# Patient Record
Sex: Female | Born: 1980 | Race: White | Hispanic: Yes | Marital: Single | Smoking: Never smoker
Health system: Southern US, Community
[De-identification: ages and names within clinical notes are randomized; demographics above are authoritative.]

## PROBLEM LIST (undated history)

## (undated) ENCOUNTER — Inpatient Hospital Stay (HOSPITAL_COMMUNITY): Payer: Self-pay

## (undated) ENCOUNTER — Inpatient Hospital Stay (HOSPITAL_COMMUNITY): Admission: RE | Payer: Self-pay | Source: Ambulatory Visit

## (undated) DIAGNOSIS — Z5189 Encounter for other specified aftercare: Secondary | ICD-10-CM

## (undated) DIAGNOSIS — R000 Tachycardia, unspecified: Secondary | ICD-10-CM

## (undated) DIAGNOSIS — Z789 Other specified health status: Secondary | ICD-10-CM

## (undated) HISTORY — DX: Tachycardia, unspecified: R00.0

## (undated) HISTORY — DX: Encounter for other specified aftercare: Z51.89

---

## 2004-02-15 ENCOUNTER — Inpatient Hospital Stay (HOSPITAL_COMMUNITY): Admission: AD | Admit: 2004-02-15 | Discharge: 2004-02-15 | Payer: Self-pay | Admitting: Obstetrics & Gynecology

## 2004-07-31 ENCOUNTER — Inpatient Hospital Stay (HOSPITAL_COMMUNITY): Admission: AD | Admit: 2004-07-31 | Discharge: 2004-08-01 | Payer: Self-pay | Admitting: *Deleted

## 2004-08-01 ENCOUNTER — Ambulatory Visit: Payer: Self-pay | Admitting: Obstetrics and Gynecology

## 2004-08-01 ENCOUNTER — Inpatient Hospital Stay (HOSPITAL_COMMUNITY): Admission: AD | Admit: 2004-08-01 | Discharge: 2004-08-01 | Payer: Self-pay | Admitting: Obstetrics & Gynecology

## 2004-08-16 ENCOUNTER — Ambulatory Visit: Payer: Self-pay | Admitting: *Deleted

## 2004-08-23 ENCOUNTER — Ambulatory Visit: Payer: Self-pay | Admitting: *Deleted

## 2004-08-30 ENCOUNTER — Ambulatory Visit: Payer: Self-pay | Admitting: *Deleted

## 2004-09-02 ENCOUNTER — Ambulatory Visit: Payer: Self-pay | Admitting: *Deleted

## 2004-09-05 ENCOUNTER — Ambulatory Visit: Payer: Self-pay | Admitting: Obstetrics & Gynecology

## 2004-09-05 ENCOUNTER — Inpatient Hospital Stay (HOSPITAL_COMMUNITY): Admission: AD | Admit: 2004-09-05 | Discharge: 2004-09-08 | Payer: Self-pay | Admitting: Family Medicine

## 2004-09-13 ENCOUNTER — Ambulatory Visit: Payer: Self-pay | Admitting: *Deleted

## 2005-06-06 ENCOUNTER — Emergency Department (HOSPITAL_COMMUNITY): Admission: EM | Admit: 2005-06-06 | Discharge: 2005-06-07 | Payer: Self-pay | Admitting: Emergency Medicine

## 2005-07-04 ENCOUNTER — Emergency Department (HOSPITAL_COMMUNITY): Admission: EM | Admit: 2005-07-04 | Discharge: 2005-07-04 | Payer: Self-pay | Admitting: Emergency Medicine

## 2006-01-02 ENCOUNTER — Emergency Department (HOSPITAL_COMMUNITY): Admission: EM | Admit: 2006-01-02 | Discharge: 2006-01-02 | Payer: Self-pay | Admitting: Emergency Medicine

## 2006-01-03 ENCOUNTER — Inpatient Hospital Stay (HOSPITAL_COMMUNITY): Admission: AD | Admit: 2006-01-03 | Discharge: 2006-01-03 | Payer: Self-pay | Admitting: Gynecology

## 2006-01-17 ENCOUNTER — Ambulatory Visit (HOSPITAL_COMMUNITY): Admission: RE | Admit: 2006-01-17 | Discharge: 2006-01-17 | Payer: Self-pay | Admitting: Gynecology

## 2006-01-24 ENCOUNTER — Encounter (INDEPENDENT_AMBULATORY_CARE_PROVIDER_SITE_OTHER): Payer: Self-pay | Admitting: *Deleted

## 2006-01-24 ENCOUNTER — Ambulatory Visit: Payer: Self-pay | Admitting: *Deleted

## 2006-02-21 ENCOUNTER — Ambulatory Visit: Payer: Self-pay | Admitting: *Deleted

## 2006-03-21 ENCOUNTER — Ambulatory Visit: Payer: Self-pay | Admitting: Obstetrics & Gynecology

## 2006-04-04 ENCOUNTER — Ambulatory Visit: Payer: Self-pay | Admitting: *Deleted

## 2006-04-18 ENCOUNTER — Ambulatory Visit: Payer: Self-pay | Admitting: *Deleted

## 2006-05-02 ENCOUNTER — Ambulatory Visit: Payer: Self-pay | Admitting: Obstetrics & Gynecology

## 2006-05-16 ENCOUNTER — Ambulatory Visit: Payer: Self-pay | Admitting: *Deleted

## 2006-05-23 ENCOUNTER — Ambulatory Visit: Payer: Self-pay | Admitting: Obstetrics & Gynecology

## 2006-05-30 ENCOUNTER — Ambulatory Visit: Payer: Self-pay | Admitting: Obstetrics & Gynecology

## 2006-06-06 ENCOUNTER — Ambulatory Visit: Payer: Self-pay | Admitting: Obstetrics & Gynecology

## 2006-06-12 ENCOUNTER — Ambulatory Visit: Payer: Self-pay | Admitting: Certified Nurse Midwife

## 2006-06-12 ENCOUNTER — Inpatient Hospital Stay (HOSPITAL_COMMUNITY): Admission: AD | Admit: 2006-06-12 | Discharge: 2006-06-14 | Payer: Self-pay | Admitting: Obstetrics & Gynecology

## 2007-01-17 ENCOUNTER — Emergency Department (HOSPITAL_COMMUNITY): Admission: EM | Admit: 2007-01-17 | Discharge: 2007-01-17 | Payer: Self-pay | Admitting: Emergency Medicine

## 2008-06-23 ENCOUNTER — Emergency Department (HOSPITAL_COMMUNITY): Admission: EM | Admit: 2008-06-23 | Discharge: 2008-06-23 | Payer: Self-pay | Admitting: Emergency Medicine

## 2008-11-18 ENCOUNTER — Emergency Department (HOSPITAL_COMMUNITY): Admission: EM | Admit: 2008-11-18 | Discharge: 2008-11-18 | Payer: Self-pay | Admitting: Emergency Medicine

## 2008-12-05 ENCOUNTER — Emergency Department (HOSPITAL_COMMUNITY): Admission: EM | Admit: 2008-12-05 | Discharge: 2008-12-06 | Payer: Self-pay | Admitting: Emergency Medicine

## 2009-05-07 ENCOUNTER — Emergency Department (HOSPITAL_COMMUNITY): Admission: EM | Admit: 2009-05-07 | Discharge: 2009-05-07 | Payer: Self-pay | Admitting: Emergency Medicine

## 2009-08-31 ENCOUNTER — Ambulatory Visit (HOSPITAL_COMMUNITY)
Admission: RE | Admit: 2009-08-31 | Discharge: 2009-08-31 | Payer: Self-pay | Source: Home / Self Care | Admitting: Family Medicine

## 2009-11-10 ENCOUNTER — Ambulatory Visit: Payer: Self-pay | Admitting: Obstetrics and Gynecology

## 2009-11-10 ENCOUNTER — Inpatient Hospital Stay (HOSPITAL_COMMUNITY)
Admission: AD | Admit: 2009-11-10 | Discharge: 2009-11-10 | Payer: Self-pay | Source: Home / Self Care | Admitting: Obstetrics and Gynecology

## 2009-12-31 ENCOUNTER — Ambulatory Visit (HOSPITAL_COMMUNITY)
Admission: RE | Admit: 2009-12-31 | Discharge: 2009-12-31 | Payer: Self-pay | Source: Home / Self Care | Attending: Obstetrics & Gynecology | Admitting: Obstetrics & Gynecology

## 2010-01-03 ENCOUNTER — Inpatient Hospital Stay (HOSPITAL_COMMUNITY)
Admission: AD | Admit: 2010-01-03 | Discharge: 2010-01-05 | Payer: Self-pay | Source: Home / Self Care | Attending: Obstetrics & Gynecology | Admitting: Obstetrics & Gynecology

## 2010-04-04 LAB — CBC
HCT: 40.5 % (ref 36.0–46.0)
Hemoglobin: 13.6 g/dL (ref 12.0–15.0)
MCH: 29.7 pg (ref 26.0–34.0)
MCHC: 33.6 g/dL (ref 30.0–36.0)
RBC: 4.59 MIL/uL (ref 3.87–5.11)

## 2010-04-06 LAB — URINALYSIS, ROUTINE W REFLEX MICROSCOPIC
Bilirubin Urine: NEGATIVE
Glucose, UA: NEGATIVE mg/dL
Ketones, ur: NEGATIVE mg/dL
Specific Gravity, Urine: <1.005 — ABNORMAL LOW (ref 1.005–1.030)
pH: 6.5 (ref 5.0–8.0)

## 2010-04-12 LAB — CBC
HCT: 39.7 % (ref 36.0–46.0)
MCV: 87.9 fL (ref 78.0–100.0)
Platelets: 157 10*3/uL (ref 150–400)
RDW: 13.4 % (ref 11.5–15.5)
WBC: 7.9 10*3/uL (ref 4.0–10.5)

## 2010-04-12 LAB — WET PREP, GENITAL
Trich, Wet Prep: NONE SEEN
Yeast Wet Prep HPF POC: NONE SEEN

## 2010-04-12 LAB — ABO/RH: ABO/RH(D): O POS

## 2010-04-12 LAB — DIFFERENTIAL
Basophils Absolute: 0.1 10*3/uL (ref 0.0–0.1)
Basophils Relative: 1 % (ref 0–1)
Eosinophils Absolute: 0.1 10*3/uL (ref 0.0–0.7)
Eosinophils Relative: 1 % (ref 0–5)
Lymphs Abs: 1.1 10*3/uL (ref 0.7–4.0)
Neutrophils Relative %: 79 % — ABNORMAL HIGH (ref 43–77)

## 2010-04-12 LAB — HCG, QUANTITATIVE, PREGNANCY: hCG, Beta Chain, Quant, S: 58228 m[IU]/mL — ABNORMAL HIGH (ref <5)

## 2010-04-12 LAB — GC/CHLAMYDIA PROBE AMP, GENITAL: Chlamydia, DNA Probe: NEGATIVE

## 2010-04-27 LAB — PREGNANCY, URINE: Preg Test, Ur: NEGATIVE

## 2010-04-27 LAB — URINE CULTURE: Colony Count: NO GROWTH

## 2010-04-27 LAB — URINALYSIS, ROUTINE W REFLEX MICROSCOPIC
Bilirubin Urine: NEGATIVE
Glucose, UA: NEGATIVE mg/dL
Hgb urine dipstick: NEGATIVE
Ketones, ur: NEGATIVE mg/dL
Protein, ur: NEGATIVE mg/dL
Urobilinogen, UA: 0.2 mg/dL (ref 0.0–1.0)

## 2010-04-27 LAB — POCT I-STAT, CHEM 8
Chloride: 102 mEq/L (ref 96–112)
Creatinine, Ser: 0.6 mg/dL (ref 0.4–1.2)
Glucose, Bld: 82 mg/dL (ref 70–99)
HCT: 42 % (ref 36.0–46.0)
Hemoglobin: 14.3 g/dL (ref 12.0–15.0)
Potassium: 3.5 mEq/L (ref 3.5–5.1)
Sodium: 141 mEq/L (ref 135–145)

## 2010-04-27 LAB — URINE MICROSCOPIC-ADD ON

## 2010-04-28 LAB — POCT CARDIAC MARKERS
CKMB, poc: <1 ng/mL — ABNORMAL LOW (ref 1.0–8.0)
Troponin i, poc: <0.05 ng/mL (ref 0.00–0.09)

## 2010-04-28 LAB — URINE MICROSCOPIC-ADD ON

## 2010-04-28 LAB — URINALYSIS, ROUTINE W REFLEX MICROSCOPIC
Glucose, UA: NEGATIVE mg/dL
Protein, ur: NEGATIVE mg/dL
Specific Gravity, Urine: 1.028 (ref 1.005–1.030)
Urobilinogen, UA: 1 mg/dL (ref 0.0–1.0)

## 2010-04-28 LAB — DIFFERENTIAL
Eosinophils Relative: 6 % — ABNORMAL HIGH (ref 0–5)
Lymphocytes Relative: 22 % (ref 12–46)
Lymphs Abs: 1.9 10*3/uL (ref 0.7–4.0)
Monocytes Absolute: 0.5 10*3/uL (ref 0.1–1.0)
Monocytes Relative: 6 % (ref 3–12)
Neutro Abs: 5.5 10*3/uL (ref 1.7–7.7)

## 2010-04-28 LAB — POCT I-STAT, CHEM 8
BUN: 17 mg/dL (ref 6–23)
Chloride: 105 mEq/L (ref 96–112)
Creatinine, Ser: 0.9 mg/dL (ref 0.4–1.2)
Glucose, Bld: 82 mg/dL (ref 70–99)
HCT: 42 % (ref 36.0–46.0)
Potassium: 3.8 mEq/L (ref 3.5–5.1)

## 2010-04-28 LAB — CBC
HCT: 39.3 % (ref 36.0–46.0)
Hemoglobin: 13.8 g/dL (ref 12.0–15.0)
RBC: 4.46 MIL/uL (ref 3.87–5.11)

## 2010-04-28 LAB — URINE CULTURE

## 2010-05-22 ENCOUNTER — Emergency Department (HOSPITAL_COMMUNITY)
Admission: EM | Admit: 2010-05-22 | Discharge: 2010-05-22 | Disposition: A | Discharge status: Home / Self Care | Payer: Self-pay | Attending: Emergency Medicine | Admitting: Emergency Medicine

## 2010-05-22 ENCOUNTER — Emergency Department (HOSPITAL_COMMUNITY): Payer: Self-pay

## 2010-05-22 DIAGNOSIS — S0003XA Contusion of scalp, initial encounter: Secondary | ICD-10-CM | POA: Insufficient documentation

## 2010-05-22 DIAGNOSIS — Y92009 Unspecified place in unspecified non-institutional (private) residence as the place of occurrence of the external cause: Secondary | ICD-10-CM | POA: Insufficient documentation

## 2010-05-22 DIAGNOSIS — S1093XA Contusion of unspecified part of neck, initial encounter: Secondary | ICD-10-CM | POA: Insufficient documentation

## 2010-05-22 DIAGNOSIS — M542 Cervicalgia: Secondary | ICD-10-CM | POA: Insufficient documentation

## 2010-05-22 DIAGNOSIS — S40029A Contusion of unspecified upper arm, initial encounter: Secondary | ICD-10-CM | POA: Insufficient documentation

## 2010-06-10 NOTE — Discharge Summary (Signed)
NAME:  Rhonda Townsend, Rhonda Townsend ACCOUNT NO.:  [id number]   MEDICAL RECORD NO.:  [id number]          PATIENT TYPE:  INP   LOCATION:                                FACILITY:  [facility]   PHYSICIAN:  Tanya S. Shawnie Townsend, M.D.   DATE OF BIRTH:  06-Nov-1980   DATE OF ADMISSION:  09/02/2004  DATE OF DISCHARGE:                                 DISCHARGE SUMMARY   REASON FOR ADMISSION:  Onset of labor.   PROCEDURES PRENATAL:  None.   PROCEDURES INTRAPARTUM:  Spontaneous vaginal delivery.   PROCEDURES POSTPARTUM:  1.  Transfusion 2 units packed red blood cells.  2.  P.o. antibiotics on discharge.   COMPLICATIONS OPERATIVE AND POSTPARTUM:  Third-degree perineal laceration,  vaginal laceration, bilateral sulcus laceration, postpartum hemorrhage.   DISCHARGE DIAGNOSES:  Term pregnancy, delivered.   HOSPITAL COURSE AND COMMENTS:  The patient is a 30 year old G1 P1-0-0-1 who  presented at 36 and four-sevenths weeks with rupture of membranes and  spontaneous onset of labor. The patient went on to NSVD with epidural  anesthesia. Three-vessel cord, spontaneous placenta, EBL 500 mL. Third-  degree perineal laceration, second-degree labial laceration, bilateral  sulcus laceration repaired with 3-0 Vicryl. Female infant delivered with  Apgars of 6 and 9 at one and five minutes respectively. Mother is  breastfeeding and is using no contraception. GBS negative, blood type O  positive, rubella immune, antibody negative.   The patient had extensive lacerations following delivery whose repairs were  complicated by extensive soft tissue friability and edema. The patient bled  frequently during repair and suffered acute hemodynamic decompensation  during the repair with hypotension, tachycardia, and emesis. Fluid boluses  and vasopressors were given. Uterine integrity and abdominal free fluid were  evaluated by bedside ultrasound. Uterus was found to be intact, no visible  signs of hemorrhage were detected.  Foley catheter was placed and vagina was  gauze packed and iced. The patient was transferred to the ICU. Predelivery  hemoglobin was 11.5, postdelivery hemoglobin was 8.5. The patient was placed  on IV fluids with strict I's and O's. Repeat CBC 6 hours after delivery  resulted in a hemoglobin of 6.2. During this time the patient became  somnolent and was transfused with 2 units packed red cells and hydrated with  LR/D5 at 125 mL per hour. The patient responded well to IV hydration and red  cell transfusion. Follow-up hemoglobin was 9.0, hematocrit 27.0. The patient  was stable, afebrile, and was maintained in the ICU for an additional day.  Packing was removed and revealed no active bleeding or serosanguineous  discharge. Foley catheter was removed and the patient successfully completed  voiding trial. The patient and baby were stable. The patient was discharged  on postpartum day #3 with oral antibiotics, pain medication, and prenatal  vitamins with instructions to have close follow-up to high risk clinic and  to return immediately if bleeding, discharge, fever, fatigue, shortness of  breath, or dizziness occurred.   DISCHARGE MEDICATIONS:  1.  Percocet 5/325 one to two tablets p.o. q.4h. p.r.n.  2.  Ibuprofen 200 mg two tablets p.o. q.6h. p.r.n.  3.  Cephalexin 500 mg one tablet p.o. b.i.d.  x7 days.   Regular diet. Normal activity with bedrest as needed. Follow-up care to high  risk clinic in 5 days. Discharged to home. The baby accompanied the patient.  Both baby and patient were stable and afebrile.      Rhonda Townsend, M.D.    ______________________________  Rhonda Townsend, M.D.    JP/MEDQ  D:  09/08/2004  T:  09/08/2004  Job:  [id number]

## 2011-06-07 ENCOUNTER — Encounter (HOSPITAL_COMMUNITY): Payer: Self-pay | Admitting: *Deleted

## 2011-06-07 ENCOUNTER — Emergency Department (HOSPITAL_COMMUNITY)
Admission: EM | Admit: 2011-06-07 | Discharge: 2011-06-08 | Disposition: A | Discharge status: Home / Self Care | Payer: Self-pay | Attending: Emergency Medicine | Admitting: Emergency Medicine

## 2011-06-07 DIAGNOSIS — N83209 Unspecified ovarian cyst, unspecified side: Secondary | ICD-10-CM | POA: Insufficient documentation

## 2011-06-07 DIAGNOSIS — N898 Other specified noninflammatory disorders of vagina: Secondary | ICD-10-CM

## 2011-06-07 DIAGNOSIS — Z331 Pregnant state, incidental: Secondary | ICD-10-CM

## 2011-06-07 DIAGNOSIS — N83202 Unspecified ovarian cyst, left side: Secondary | ICD-10-CM

## 2011-06-07 DIAGNOSIS — R1032 Left lower quadrant pain: Secondary | ICD-10-CM | POA: Insufficient documentation

## 2011-06-07 DIAGNOSIS — O34599 Maternal care for other abnormalities of gravid uterus, unspecified trimester: Secondary | ICD-10-CM | POA: Insufficient documentation

## 2011-06-07 MED ORDER — IBUPROFEN 800 MG PO TABS
800.0000 mg | ORAL_TABLET | Freq: Once | ORAL | Status: AC
  Administered 2011-06-07: 800 mg via ORAL
  Filled 2011-06-07: qty 1

## 2011-06-07 NOTE — ED Notes (Signed)
Patient reports she has pain in her lower abd on the right side for 2 mths.  Patient she is on her period at this time.  She denies n/v/d.  Patient denies any diff voiding.

## 2011-06-07 NOTE — ED Notes (Signed)
Received pt. From triage, pt. Alert and oriented, c/o left abd. Pain x 2 months, NAD noted, pt. ambulatory gait steady

## 2011-06-08 ENCOUNTER — Emergency Department (HOSPITAL_COMMUNITY): Payer: Self-pay

## 2011-06-08 LAB — URINALYSIS, ROUTINE W REFLEX MICROSCOPIC
Bilirubin Urine: NEGATIVE
Nitrite: NEGATIVE
Specific Gravity, Urine: 1.027 (ref 1.005–1.030)
Urobilinogen, UA: 1 mg/dL (ref 0.0–1.0)

## 2011-06-08 LAB — POCT PREGNANCY, URINE: Preg Test, Ur: POSITIVE — AB

## 2011-06-08 LAB — URINE MICROSCOPIC-ADD ON

## 2011-06-08 LAB — HCG, QUANTITATIVE, PREGNANCY: hCG, Beta Chain, Quant, S: 105 m[IU]/mL — ABNORMAL HIGH (ref <5)

## 2011-06-08 LAB — WET PREP, GENITAL: Trich, Wet Prep: NONE SEEN

## 2011-06-08 MED ORDER — DOXYCYCLINE HYCLATE 100 MG PO TABS
100.0000 mg | ORAL_TABLET | Freq: Once | ORAL | Status: AC
  Administered 2011-06-08: 100 mg via ORAL
  Filled 2011-06-08: qty 1

## 2011-06-08 MED ORDER — ONDANSETRON 4 MG PO TBDP
8.0000 mg | ORAL_TABLET | Freq: Once | ORAL | Status: AC
  Administered 2011-06-08: 8 mg via ORAL
  Filled 2011-06-08: qty 1

## 2011-06-08 MED ORDER — METRONIDAZOLE 500 MG PO TABS: 500.0000 mg | ORAL_TABLET | Freq: Two times a day (BID) | ORAL | Status: AC

## 2011-06-08 MED ORDER — ONDANSETRON HCL 4 MG PO TABS: 4.0000 mg | ORAL_TABLET | Freq: Four times a day (QID) | ORAL | Status: AC

## 2011-06-08 MED ORDER — LIDOCAINE HCL (PF) 1 % IJ SOLN
INTRAMUSCULAR | Status: AC
  Administered 2011-06-08: 5 mL
  Filled 2011-06-08: qty 5

## 2011-06-08 MED ORDER — CEFTRIAXONE SODIUM 250 MG IJ SOLR
250.0000 mg | Freq: Once | INTRAMUSCULAR | Status: AC
  Administered 2011-06-08: 250 mg via INTRAMUSCULAR
  Filled 2011-06-08: qty 250

## 2011-06-08 MED ORDER — AZITHROMYCIN 1 G PO PACK
1.0000 g | PACK | Freq: Once | ORAL | Status: AC
  Administered 2011-06-08: 1 g via ORAL
  Filled 2011-06-08: qty 1

## 2011-06-08 NOTE — ED Notes (Signed)
Patient informed of the need for a pelvic exam and extended wait for MD for procedure at this time.  Patient's family left bedside for procedure.  No complaints noted, patient resting quietly.  Patient states she is not on her period, urine sample already obtained via clean catch method.  Will discuss with MD need for I and O cath vs clean catch urine per patient request.

## 2011-06-08 NOTE — ED Provider Notes (Signed)
History     CSN: [id number]  Arrival date & time 06/07/11  1907   First MD Initiated Contact with Patient 06/07/11 2307      Chief Complaint  Patient presents with  . Abdominal Pain    (Consider location/radiation/quality/duration/timing/severity/associated sxs/prior treatment) HPI 31 year old female presents to the emergency department with complaint of left lower abdomen pain ongoing for the last 2 months. Patient denies any fevers, no nausea vomiting or diarrhea. Patient reports no urinary symptoms, no vaginal discharge. Patient's last menstrual period was April 18. Patient had C-section 10 years ago. Patient reports pain is worse with cough or sneeze or straining.  No sick contacts, no travel, no other complaints.   History reviewed. No pertinent past medical history.  Past Surgical History  Procedure Date  . Cesarean section     No family history on file.  History  Substance Use Topics  . Smoking status: Never Smoker   . Smokeless tobacco: Not on file  . Alcohol Use: No    OB History    Grav Para Term Preterm Abortions TAB SAB Ect Mult Living                  Review of Systems  All other systems reviewed and are negative.    Allergies  Review of patient's allergies indicates no known allergies.  Home Medications  No current outpatient prescriptions on file.  BP 111/67  Pulse 74  Temp(Src) 98.3 F (36.8 C) (Oral)  Resp 18  SpO2 100%  Physical Exam  Nursing note and vitals reviewed. Constitutional: She appears well-developed and well-nourished. No distress.  HENT:  Head: Normocephalic and atraumatic.  Nose: Nose normal.  Mouth/Throat: Oropharynx is clear and moist. No oropharyngeal exudate.  Neck: Normal range of motion. Neck supple. No JVD present. No tracheal deviation present. No thyromegaly present.  Cardiovascular: Normal rate, regular rhythm, normal heart sounds and intact distal pulses.  Exam reveals no gallop and no friction rub.   No  murmur heard. Pulmonary/Chest: Effort normal and breath sounds normal. No stridor. No respiratory distress. She has no wheezes. She has no rales. She exhibits no tenderness.  Abdominal: Soft. Bowel sounds are normal. She exhibits no distension and no mass. There is tenderness (TTP in llq and suprapubic). There is no rebound and no guarding.  Genitourinary: Vaginal discharge found.       Thick yellow discharge noted, no CMT, but pain with palpation of uterus and with left adenexa  Musculoskeletal: Normal range of motion. She exhibits no edema and no tenderness.  Lymphadenopathy:    She has no cervical adenopathy.  Skin: Skin is warm and dry. No rash noted. She is not diaphoretic. No erythema. No pallor.    ED Course  Procedures (including critical care time)  Labs Reviewed  URINALYSIS, ROUTINE W REFLEX MICROSCOPIC - Abnormal; Notable for the following:    Leukocytes, UA MODERATE (*)    All other components within normal limits  WET PREP, GENITAL - Abnormal; Notable for the following:    WBC, Wet Prep HPF POC TOO NUMEROUS TO COUNT (*)    All other components within normal limits  POCT PREGNANCY, URINE - Abnormal; Notable for the following:    Preg Test, Ur POSITIVE (*)    All other components within normal limits  HCG, QUANTITATIVE, PREGNANCY - Abnormal; Notable for the following:    hCG, Beta Chain, Quant, S 105 (*)    All other components within normal limits  URINE MICROSCOPIC-ADD ON  GC/CHLAMYDIA PROBE AMP, GENITAL   US Ob Transvaginal  06/08/2011  *RADIOLOGY REPORT*  Clinical Data: Pelvic pain and vaginal discharge.  Quantitative beta HCG 105.  Estimated gestational age by LMP is 8 weeks 3 days.  TRANSVAGINAL OB ULTRASOUND  Technique:  Transvaginal ultrasound was performed for evaluation of the gestation as well as the maternal uterus and adnexal regions.  Comparison: None  Findings: The uterus is mildly anteverted.  No myometrial masses. Endometrial stripe thickness is somewhat  prominent, measuring about 17-21 mm.  No endometrial fluid collections.  No intrauterine gestational sac, fetal pole, yolk sac, fetal cardiac activity are identified.  Lower uterine segment and cervix are unremarkable.  The right ovary measures 3.2 x 2.1 x 2.2 cm.  Normal follicular changes demonstrated.  The left ovary measures 4.5 x 2.7 x 3.3 cm and contains a complex cystic lesion measuring 2.5 x 2 x 2.4 cm. No significant increased flow.  This is likely representing hemorrhagic cyst.  No definitive evidence of ectopic pregnancy. However, without a documented intrauterine pregnancy, ectopic is still to be excluded.  No free pelvic fluid collections.  IMPRESSION: Prominent endometrial stripe thickness.  No intrauterine pregnancy demonstrated.  Complex cystic structure without increased flow in the left ovary.  Findings may represent early intrauterine pregnancy which is too small to visualize versus missed spontaneous abortion.  Ectopic pregnancy is not entirely excluded.  Recommend follow-up with serial quantitative beta HCG levels and / or short- term ultrasound 102 weeks as clinically indicated.  Original Report Authenticated By: Marlon Pel, M.D.     1. Ovarian cyst, left   2. Pregnancy as incidental finding   3. Vaginal discharge in pregnancy, first trimester       MDM  31 yo female who reports LMP one month ago with 2 months of llq abd pain. Pelvic exam with tenderness over the uterus and left adnexa, foul discharge in vagina.  Upreg noted to be positive after treatment of presumed PID.  To have hcg, u/s of pelvis.   6:56 AM D/w CNM at [facility].  Pt will f/u there in 2 days for recheck of quant, cystic mass on left adnexa.  Pt updated on findings and plan.        Olivia Mackie, MD 06/08/11 [phone]

## 2011-06-08 NOTE — Discharge Instructions (Signed)
Your workup today has shown a cyst on your left ovary, vaginal discharge that is concerning for infection, and a positive pregnancy test.  This is a very early pregnancy, and no fetus was seen on the ultrasound yet.  Take antibiotic as prescribed.  You can take tylenol for pain.  You need to go to [facility] in 2 days for recheck of your pregnancy blood work (HCG).  Go there sooner if you are having fevers, worsening pain, vomiting, or other new concerning symptoms.    Su diagnstico diferencial de hoy ha [employer] un quiste en el ovario izquierdo, el flujo vaginal que es preocupante para la infeccin y Neomia Dear prueba de embarazo positiva. Este es un embarazo muy temprano, y no hay ningn feto fue visto en el ultrasonido todava. Tome los antibiticos segn las indicaciones. Puede tomar Tylenol para Chief Technology Officer. Tienes que ir [employer] de la [place], la Unidad de Admisiones de maternidad en [address]  [address] para Programme researcher, broadcasting/film/video a Veterinary surgeon de sus anlisis de sangre del embarazo (HCG). Ir all antes si usted tiene fiebre, aumento del dolor, vmito u otros sntomas nuevos referentes. Quiste ovrico (Ovarian Cyst) Los ovarios son pequeos rganos que se encuentran a cada lado del tero. Los ovarios son los rganos que producen las hormonas femeninas, estrgeno y Education officer, museum. Un quiste en el ovario es una bolsa llena de lquido que puede variar en tamao. Es normal que se formen pequeos quistes en las mujeres en edad de procrear y que an tienen sus perodos Designer, jewellery. Este tipo de quiste se denomina quiste folicular que se transforma en un quiste ovulatorio (quiste del cuerpo lteo) despus de producir los vulos. Si la mujer no queda embarazada, desaparece sin ninguna intervencin. Existen otros tipos de quistes de ovario que pueden causar problemas y necesitan ser tratados. El problema ms grave es que el quiste sea canceroso. Debe advertirse que en las mujeres menopusicas que presentan un quiste de  ovario, existe un mayor riesgo de que ese quiste sea canceroso. Deben evaluarse muy rpida y [place], y IT sales professional. Esto es ms importante en las mujeres menopusicas debido al elevado porcentaje de cncer de ovario durante este perodo. CAUSAS Y TIPOS DE CNCER DE OVARIO:  QUISTE FUNCIONAL: El quiste de folculo o cuerpo lteo es un quiste funcional que aparece todos los meses durante la ovulacin, con el ciclo menstrual. Si la mujer no queda embarazada, desaparecen con el prximo ciclo menstrual. Generalmente los quistes funcionales no presentan sntomas.   ENDOMETRIOMA: este quiste aparece en la superficie del tejido del tero. Un quiste se forma en el interior o [employer]. Cada mes se desarrolla un poco ms debido a la sangre del perodo menstrual. Tambin se denomina "quiste de chocolate" debido a que est lleno de sangre que se vuelve color marrn. Este tipo de quiste causa dolor en la zona inferior del abdomen durante las relaciones sexuales y durante el perodo menstrual.   CISTADENOMA: Se desarrolla a partir de las clulas externas del ovario. Generalmente no son cancerosos. Pueden llegar a ser de gran tamao y causar dolor en la zona baja del abdomen y [employer] sexuales. Este tipo de quiste puede retorcerse e interrumpir el flujo de [place], lo que causa un dolor muy intenso. Tambin puede romperse y Horticulturist, commercial.   QUISTE DERMOIDE: generalmente este tipo de quiste aparece en ambos ovarios. Puede haber diferentes tipos de tejidos en el quiste. Por ejemplo tejidos de piel, dientes, pelos o cartlago. En general no  dan sntomas, excepto que sean muy grandes. Los quistes dermoides rara vez son cancerosos.   OVARIO POLIQUSTICO: es una enfermedad rara relacionada con trastornos hormonales que produce muchos quistes pequeos en ambos ovarios. Estos quistes son similares a los quistes de folculo pero nunca producen vulos y se transforman en cuerpo  lteo. Pueden causar aumento del [employer], infertilidad, acn, aumento del vello facial y corporal y falta de perodos menstruales o perodos anormales. Muchas mujeres que sufren este problema presentan diabetes tipo 2. La causa exacta de este problema es desconocida. Un ovario poliqustico rara vez es canceroso.   QUISTE OVRICO TECALUTESTICO Aparece cuando hay demasiada hormona (gonadotrofina corinica humana), la que sobreestimula al ovario para producir vulos. Se observan con frecuencia cuando el mdico estimula los ovarios para la fertilizacin in vitro (bebs de probeta).   QUISTE LUTENICO: Aparece durante el embarazo. En algunos casos raros, produce una obstruccin del canal de parto. Generalmente desaparece despus del parto.  SNTOMAS  Dolor o molestias en la pelvis.   Dolor durante las [employer].   Aumento de la inflamacin en el abdomen.   Perodos menstruales anormales.   Aumento del [employer] perodos [place].   Deja de menstruar y no est embarazada.  DIAGNSTICO El diagnstico puede realizarse durante:  Los exmenes plvicos anuales o de rutina (frecuente).   Ecografas   Radiografas de la pelvis.   Tomografa computada   Resonancia magntica..   Anlisis de sangre.  TRATAMIENTO  El tratamiento slo consiste en que el mdico controle el quiste [place], durante 2  3 meses. Muchos desaparecen espontnemente, especialmente los quistes funcionales.   Puede aspirarse (secarse) con Marella Bile larga observndolo en una ecografa, o por laparoscopa (insertando un tubo en la pelvis a travs de una pequea incisin).   El quiste puede extirparse con laparoscopa.   En algunos casos es necesario extirparlo a travs de una incisin en la zona inferior del abdomen.   El tratamiento hormonal se utiliza para Restaurant manager, fast food ciertos tipos de [place].   Las pldoras anticonceptivas pueden utilizarse para Restaurant manager, fast food otros tipos.  INSTRUCCIONES PARA EL  CUIDADO DOMICILIARIO Siga las indicaciones del profesional con respecto a:  Medicamentos   Visitas de control para evaluar y Pharmacologist.   Puede ser necesario que tenga que volver o concertar una cita con otro profesional para descubrir la causa exacta del quiste, si su mdico no es Research scientist (physical sciences).   Realice un examen plvico y un Papanicolau todos los aos, segn las indicaciones.   Informe al mdico si tubo un quiste de ovario en el pasado.  SOLICITE ATENCIN MDICA SI:  Los perodos se atrasan, son irregulares, le faltan o son dolorosos.   El dolor abdominal (en el vientre) o en la pelvis persisten.   El abdomen se agranda o se hincha.   Siente una opresin en la vejiga o tiene problemas para vaciarla completamente.   Tiene dolor durante las [employer].   Tiene la sensacin de hinchazn, presin o molestias en el abdomen.   Pierde peso sin razn aparente.   Siente un Engineer, maintenance (IT).   Est constipada.   Pierde el apetito.   Aparece acn.   Aumenta el vello facial y Personal assistant.   Lenora Boys de peso sin hacer modificaciones en su actividad fsica y en su dieta habitual.   Sospecha que est embarazada.  SOLICITE ATENCIN MDICA DE INMEDIATO SI:  Siente dolor abdominal cada vez ms intenso.   Si tiene ganas de vomitar (nuseas).   Le sube  repentinamente la fiebre.   Siente dolor abdominal al mover el intestino.   Sus perodos menstruales son ms abundantes que lo habitual.  Document Released: 10/19/2004 Document Revised: 12/29/2010 [facility] Patient Information 2012 [place], [place].  Psychiatrist (Pregnancy) Si planea quedar embarazada, es una buena idea concertar una cita de preconcepcin con el mdico para poder lograr un estilo de vida saludable ante de quedar embarazada. Esto incluye dieta, peso, ejercicio, el tomar vitaminas prenatales en especial cido flico (ayuda a prevenir defectos en el cerebro y la mdula espinal), evitar el alcohol, fumar,  las drogas ilegales, problemas mdicos (diabetes, convulsiones), historial familiar de problemas genticos, condiciones de trabajo e inmunizaciones. Es mejor tener conocimiento de estas cosas y Radio producer algo antes de quedar embarazada. Si est embarazada, es necesario que siga ciertas pautas para tener un beb sano. Es muy importante Education officer, environmental controles prenatales adecuados y seguir las indicaciones del profesional que la asiste. La atencin prenatal incluye toda la asistencia mdica que usted recibe antes del nacimiento del beb. Esto ayuda a Publishing copy y [place]. INSTRUCCIONES PARA EL CUIDADO DOMICILIARIO  Comience las consultas prenatales alrededor de la 12 semana de embarazo o lo antes posible. Al principio generalmente se programan cada mes. Se hacen ms frecuentes en los 2 ltimos meses antes del parto. Es importante que concurra a todas las citas con el profesional y siga sus instrucciones con Camera operator a los medicamentos que deba Chemical engineer, a la actividad fsica y a Psychologist, forensic.   Durante el embarazo debe obtener nutrientes para usted y para su beb. Consuma una dieta normal y bien balanceada. Elija alimentos como carne, pescado, [place] y otros productos lcteos, vegetales, frutas, panes integrales y Research officer, trade union cul es el aumento de [place] ideal, segn su peso y Risk analyst. Beba gran cantidad de lquidos. Trate de beber 8 vasos de lquidos por Futures trader.   El alcohol se asocia a cierto nmero de defectos del nacimiento, incluyendo el sndrome de alcoholismo fetal. Lo mejor es evitarlo completamente El cigarrillo causa nacimientos prematuros y bebs de bajo peso al Psychologist, clinical. El consumo de alcohol y nicotina durante el embarazo tambin aumentan marcadamente la probabilidad de que el nio sea qumicamente dependiente en etapas posteriores de su vida y puede contribuir al sndrome de muerte sbita infantil (SMSI)   No consuma drogas.   Solo tome medicamentos  prescriptos o de venta libre que le haya recomendado el profesional. Algunos medicamentos pueden causar problemas genticos y fsicos al beb   Las nuseas matinales pueden aliviarse si come Chiropodist en la cama. Coma dos galletitas antes de levantarse por la maana.   Las relaciones sexuales pueden continuarse hasta casi el final del embarazo, si no se presentan otros problemas como prdida prematura (antes de tiempo) de lquido amnitico, Restaurant manager, fast food vaginal, dolor durante las relaciones sexuales o dolor abdominal (en el vientre).   Practique ejercicios con regularidad. Consulte con el profesional que la asiste si no sabe con certeza si determinados ejercicios son seguros.   No utilice la baera con agua caliente, baos turcos y saunas. Estos aumentan el riesgo de sufrir un desmayo o de prdida del conocimiento, y as Runner, broadcasting/film/video usted o el beb. La natacin es un buen ejercicio. Descanse todo lo que pueda e incluya una siesta despus de almorzar siempre que le sea posible, especialmente durante el tercer trimestre.   Evite los olores y las sustancias qumicas txicas.   No use zapatos de tacones altos, podra perder el equilibrio y  caer.   No levante objetos de ms de 2,5 kg. Si levanta un objeto, flexione las piernas y los muslos, y no la espalda.   Evite los viajes largos, Paramedic trimestre.   Si debe viajar fuera de la ciudad o de su estado, lleve una copia de la historia clnica.  SOLICITE ATENCIN MDICA DE INMEDIATO SI:  La temperatura oral se eleva sin motivo por encima de 102 F (38.9 C) o segn le indique el profesional que lo asiste.   Tiene una prdida de lquido por la vagina. Si sospecha una ruptura de las [place], tmese la temperatura y llame al profesional para informarlo sobre esto.   Observa unas pequeas manchas o una hemorragia vaginal Notifique al profesional acerca de la cantidad y de cuntos apsitos est utilizando.   Contina  teniendo nuseas y no obtiene alivio de los [employer] han [place], o vomita sangre o una sustancia similar a la borra del caf.   Presenta un dolor en la zona superior del abdomen.   Siente molestias en el ligamento redondo en la parte abdominal baja. El profesional que la asiste Hydrologist.   Siente pequeas contracciones del tero (matriz)   No siente que el beb se mueve, o percibe menos movimientos que antes.   Siente dolor al [employer].   Brett Fairy hemorragia vaginal anormal.   Tiene diarrea persistente.   Sufre una cefalea grave.   Tiene problemas visuales.   Comienza a sentir debilidad muscular.   Se siente mareada o sufre un desmayo.   Comienza a sentir falta de aire.   Siente dolor en el pecho.   Sufre dolor en la espalda que se irradia hacia la pierna y el pie.   Siente latidos cardacos irregulares o la frecuencia cardaca es muy rpida.   Aumenta excesivamente de peso en un perodo breve (2,5 kg en 3 a 5 das)   Se ve envuelta en una situacin de violencia domstica.  Document Released: 10/19/2004 Document Revised: 12/29/2010 [facility] Patient Information 2012 [place], [place].

## 2011-06-09 LAB — GC/CHLAMYDIA PROBE AMP, GENITAL
Chlamydia, DNA Probe: POSITIVE — AB
GC Probe Amp, Genital: NEGATIVE

## 2011-06-10 NOTE — ED Notes (Addendum)
+   Chlamydia  Treated per protocol MD

## 2012-01-24 DIAGNOSIS — R000 Tachycardia, unspecified: Secondary | ICD-10-CM

## 2012-01-24 HISTORY — DX: Tachycardia, unspecified: R00.0

## 2012-01-24 NOTE — L&D Delivery Note (Signed)
Delivery Note At 5:58 PM a viable female was delivered via Vaginal, Spontaneous Delivery (Presentation: Right Occiput Anterior).  APGAR: 8, 9; weight 8 lb 9.9 oz (3909 g).   Placenta status: Intact, Spontaneous.  Cord: 3 vessels, nuchal cord x 2.   Complications: shoulder dystocia x 1 minute. [place]ts, suprapubic pressure and woodscrew maneuvers performed. Postpartum hemorrhage controlled with pitocin and 1000 mcg of cytotec PR, bimanual exam/massage of uterus.  Cord pH: n/a  Anesthesia: Epidural  Episiotomy: None Lacerations: 1st degree;Perineal Suture Repair: 3.0 vicryl Est. Blood Loss (mL): 1000  Mom to postpartum.  Baby to nursery-stable.  Napoleon Form 06/18/2012, 6:42 PM

## 2012-01-31 ENCOUNTER — Ambulatory Visit: Payer: Self-pay

## 2012-02-18 ENCOUNTER — Ambulatory Visit: Payer: Self-pay | Admitting: Physician Assistant

## 2012-02-18 VITALS — BP 121/75 | HR 99 | Temp 98.4°F | Resp 16 | Ht 64.0 in | Wt 168.2 lb

## 2012-02-18 DIAGNOSIS — N912 Amenorrhea, unspecified: Secondary | ICD-10-CM

## 2012-02-18 LAB — POCT URINE PREGNANCY: Preg Test, Ur: POSITIVE

## 2012-02-18 NOTE — Patient Instructions (Signed)
17 weeks and 5 days Due Jul 24 2011

## 2012-02-19 ENCOUNTER — Encounter: Payer: Self-pay | Admitting: Physician Assistant

## 2012-02-19 NOTE — Progress Notes (Signed)
   [address], [place] [address]   Phone [phone]  Subjective:    Patient ID: Rhonda Townsend, female    DOB: Nov 24, 1980, 32 y.o.   MRN: [id number]  HPI  Pt presents to clinic for a pregnancy test.  She knows she is pregnant but needs a positive test result before her appt next week at [facility].  She is not feeling movement currently.  She is not having an problems, no vaginal discharge, bleeding or nausea.  She is taking a PNV.  She does not smoke, drink ETOH or use street drugs.  She had 1 previous healthy pregnancy and has a 75 y/o daughter.  Review of Systems  Constitutional: Negative for fever and chills.  Gastrointestinal: Negative for nausea.  Genitourinary: Negative for vaginal bleeding and vaginal discharge.       Objective:   Physical Exam  Vitals reviewed. Constitutional: She is oriented to person, place, and time. She appears well-developed and well-nourished.  HENT:  Head: Normocephalic and atraumatic.  Right Ear: External ear normal.  Left Ear: External ear normal.  Pulmonary/Chest: Effort normal.  Genitourinary:       Fetal heart tones heard and sound normal. Rate app 150.  Uterus palpable.  Neurological: She is alert and oriented to person, place, and time.  Skin: Skin is warm and dry.  Psychiatric: She has a normal mood and affect. Her behavior is normal. Judgment and thought content normal.       Assessment & Plan:   1. Absence of menstruation  POCT urine pregnancy   Pregnancy- documentation given to patient for her appt next week.  Answered questions.

## 2012-02-26 ENCOUNTER — Inpatient Hospital Stay (HOSPITAL_COMMUNITY): Payer: Medicaid Other

## 2012-02-26 ENCOUNTER — Encounter (HOSPITAL_COMMUNITY): Payer: Self-pay | Admitting: *Deleted

## 2012-02-26 ENCOUNTER — Inpatient Hospital Stay (HOSPITAL_COMMUNITY)
Admission: AD | Admit: 2012-02-26 | Discharge: 2012-02-27 | Disposition: A | Discharge status: Home / Self Care | Payer: Self-pay | Source: Ambulatory Visit | Attending: Obstetrics & Gynecology | Admitting: Obstetrics & Gynecology

## 2012-02-26 ENCOUNTER — Inpatient Hospital Stay (HOSPITAL_COMMUNITY): Payer: Self-pay

## 2012-02-26 DIAGNOSIS — N949 Unspecified condition associated with female genital organs and menstrual cycle: Secondary | ICD-10-CM

## 2012-02-26 DIAGNOSIS — R42 Dizziness and giddiness: Secondary | ICD-10-CM | POA: Insufficient documentation

## 2012-02-26 DIAGNOSIS — O99891 Other specified diseases and conditions complicating pregnancy: Secondary | ICD-10-CM | POA: Insufficient documentation

## 2012-02-26 DIAGNOSIS — R109 Unspecified abdominal pain: Secondary | ICD-10-CM | POA: Insufficient documentation

## 2012-02-26 DIAGNOSIS — O26849 Uterine size-date discrepancy, unspecified trimester: Secondary | ICD-10-CM

## 2012-02-26 LAB — URINALYSIS, ROUTINE W REFLEX MICROSCOPIC
Bilirubin Urine: NEGATIVE
Ketones, ur: NEGATIVE mg/dL
Nitrite: NEGATIVE
Urobilinogen, UA: 0.2 mg/dL (ref 0.0–1.0)

## 2012-02-26 LAB — WET PREP, GENITAL: WBC, Wet Prep HPF POC: NONE SEEN

## 2012-02-26 MED ORDER — IBUPROFEN 600 MG PO TABS
600.0000 mg | ORAL_TABLET | Freq: Once | ORAL | Status: AC
  Administered 2012-02-26: 600 mg via ORAL
  Filled 2012-02-26: qty 1

## 2012-02-26 NOTE — MAU Note (Signed)
Pt having cramping, denies bleeding.  G2 P0 at 23wks.

## 2012-02-26 NOTE — MAU Provider Note (Signed)
History     CSN: [id number]  Arrival date and time: 02/26/12 0865   First Provider Initiated Contact with Patient 02/26/12 2042      Chief Complaint  Patient presents with  . Abdominal Cramping   HPI This is a 32 y.o. female at [redacted]w[redacted]d who presents with c/o lower abdominal cramping. Pain is sharp and occurs when she lies on her left side. Pain is along the sides of her lower abdomen.    She did not complain of other things to me until asked. States she has been seen at [facility] twice and had an [place] in October, but not sure what her due date is. [facility] her because they had no spanish interpretors there in [place]. Had C/S with first baby then vaginal deliveries afterward  History is also remarkable for postpartum hemorrhage.   RN Note: I have been seeing spots, dizziness and colors before my eyes for about 1 week. No problems with blood pressure. Having headaches since pregnancy Pt having cramping, denies bleeding. G2 P0 at 23wks  OB History    Grav Para Term Preterm Abortions TAB SAB Ect Mult Living   5 1 1  0 0 0 0 0 0 4      History reviewed. No pertinent past medical history.  Past Surgical History  Procedure Date  . Cesarean section     2003 (1st preg in [place])    History reviewed. No pertinent family history.  History  Substance Use Topics  . Smoking status: Never Smoker   . Smokeless tobacco: Not on file  . Alcohol Use: No    Allergies: No Known Allergies  No prescriptions prior to admission    Review of Systems  Constitutional: Positive for malaise/fatigue. Negative for fever and chills.  Eyes: Positive for blurred vision.  Gastrointestinal: Positive for abdominal pain. Negative for nausea, vomiting, diarrhea and constipation.  Genitourinary: Negative for dysuria.  Neurological: Positive for dizziness, weakness and headaches.   Physical Exam   Blood pressure 127/64, pulse 92, temperature 97.8 F (36.6 C), temperature source  Oral, resp. rate 18, height 5\' 2"  (1.575 m), weight 168 lb 6.4 oz (76.386 kg), last menstrual period 10/17/2011.  Physical Exam  Constitutional: She is oriented to person, place, and time. She appears well-developed and well-nourished. No distress.  HENT:  Head: Normocephalic.  Cardiovascular: Normal rate.   Respiratory: Effort normal.  GI: Soft. She exhibits no distension and no mass. There is tenderness (diffuse, mild throughout). There is no rebound and no guarding.  Genitourinary: Vagina normal and uterus normal. No vaginal discharge found.       Cervix FT-1/shortened/high  Musculoskeletal: Normal range of motion.  Neurological: She is alert and oriented to person, place, and time.  Skin: Skin is warm and dry.  Psychiatric: She has a normal mood and affect.    MAU Course  Procedures  MDM OB US, Cultures, wet prep  >  Wet prep negative, UA negative US shows 24.[redacted]weeks gestation. Cervix reported normal length.   Assessment and Plan  A:  SIUP at 102w0d by LMP, 24.5wks by [place]      Round ligament pain      Dizziness related to sinus congestion      No Prenatal Care  P:  Discharge      Comfort measures      Sudafed prn       Will get appt in clinic for care        [facility]  02/26/2012, 9:04 PM

## 2012-02-26 NOTE — MAU Note (Signed)
I have been seeing spots, dizziness and colors before my eyes for about 1 week.  No problems with blood pressure.  Having headaches since pregnancy.

## 2012-02-26 NOTE — MAU Note (Signed)
Was seen yesterday at [facility] for increase heart rate

## 2012-02-27 ENCOUNTER — Encounter (HOSPITAL_COMMUNITY): Payer: Self-pay

## 2012-02-27 DIAGNOSIS — R1084 Generalized abdominal pain: Secondary | ICD-10-CM

## 2012-02-27 DIAGNOSIS — R42 Dizziness and giddiness: Secondary | ICD-10-CM

## 2012-02-27 NOTE — MAU Provider Note (Signed)
Attestation of Attending Supervision of Advanced Practitioner (PA/CNM/NP): Evaluation and management procedures were performed by the Advanced Practitioner under my supervision and collaboration.  I have reviewed the Advanced Practitioner's note and chart, and I agree with the management and plan.  Avilene Marrin, MD, FACOG Attending Obstetrician & Gynecologist Faculty Practice, [facility]  

## 2012-02-28 ENCOUNTER — Ambulatory Visit (INDEPENDENT_AMBULATORY_CARE_PROVIDER_SITE_OTHER): Payer: Self-pay | Admitting: Obstetrics and Gynecology

## 2012-02-28 ENCOUNTER — Encounter: Payer: Self-pay | Admitting: Obstetrics and Gynecology

## 2012-02-28 VITALS — BP 117/69 | Temp 98.4°F | Wt 164.0 lb

## 2012-02-28 DIAGNOSIS — Z3490 Encounter for supervision of normal pregnancy, unspecified, unspecified trimester: Secondary | ICD-10-CM

## 2012-02-28 DIAGNOSIS — O34219 Maternal care for unspecified type scar from previous cesarean delivery: Secondary | ICD-10-CM | POA: Insufficient documentation

## 2012-02-28 DIAGNOSIS — Z23 Encounter for immunization: Secondary | ICD-10-CM

## 2012-02-28 LAB — POCT URINALYSIS DIP (DEVICE)
Glucose, UA: NEGATIVE mg/dL
Ketones, ur: NEGATIVE mg/dL
Specific Gravity, Urine: 1.02 (ref 1.005–1.030)
Urobilinogen, UA: 0.2 mg/dL (ref 0.0–1.0)

## 2012-02-28 MED ORDER — INFLUENZA VIRUS VACC SPLIT PF IM SUSP
0.5000 mL | INTRAMUSCULAR | Status: AC
  Administered 2012-02-28: 0.5 mL via INTRAMUSCULAR

## 2012-02-28 NOTE — Progress Notes (Signed)
   Subjective:    Rhonda Townsend is a [id number] [redacted]w[redacted]d being seen today for her first obstetrical visit.  Her obstetrical history is significant for prior C-section (1st child) and subsequent VBAC x 3. Patient does intend to breast feed. Pregnancy history fully reviewed.  Patient reports no complaints.  Filed Vitals:   02/28/12 0933  BP: 117/69  Temp: 98.4 F (36.9 C)  Weight: 164 lb (74.39 kg)    HISTORY: OB History    Grav Para Term Preterm Abortions TAB SAB Ect Mult Living   5 4 3 1  0 0 0 0 0 4     # Outc Date GA Lbr Len/2nd Wgt Sex Del Anes PTL Lv   1 TRM            2 PRE  [redacted]w[redacted]d   F LTCS      Comments: umbilical cord   3 TRM  [redacted]w[redacted]d    SVD      4 TRM  [redacted]w[redacted]d    SVD      5 CUR              Past Medical History  Diagnosis Date  . Tachycardia 2014   Past Surgical History  Procedure Date  . Cesarean section     2003 (1st preg in [place])   History reviewed. No pertinent family history.   Exam    Uterus:     Pelvic Exam:    Perineum: Normal Perineum   Vulva: normal   Vagina:  normal mucosa       Cervix: no bleeding following Pap, no cervical motion tenderness and no lesions   Adnexa: not evaluated   Bony Pelvis: gynecoid  System: Breast:  normal appearance, no masses or tenderness, Gestationion   Skin: normal coloration and turgor, no rashes    Neurologic: normal mood, no focal deficits   Extremities: No lower extremity edema.   HEENT neck supple with midline trachea and thyroid without masses   Mouth/Teeth mucous membranes moist, pharynx normal without lesions   Neck supple   Cardiovascular: regular rate and rhythm   Respiratory:  appears well, vitals normal, no respiratory distress, acyanotic, normal RR, chest clear, no wheezing, crepitations, rhonchi, normal symmetric air entry   Abdomen: soft, non-tender; bowel sounds normal; no masses,  no organomegaly   Urinary: Deferred      Assessment:    Pregnancy: [id number] Patient Active Problem  List  Diagnosis  . Previous cesarean delivery, delivered, with or without mention of antepartum condition        Plan:     Initial prenatal labs to be drawn. Pap smear done today. Problem list reviewed and updated. Ultrasound already obtained - normal findings discussed with patient. Discussed Prenatal vitamins. Follow up in 1 month   Everlene Other 02/28/2012

## 2012-02-28 NOTE — Addendum Note (Signed)
Addended by: Soyla Murphy T on: 02/28/2012 11:52 AM   Modules accepted: Orders

## 2012-02-28 NOTE — Progress Notes (Signed)
Evaluation and management procedures were performed by Resident physician under my supervision/collaboration. Chart reviewed, patient examined by me and I agree with management and plan.  

## 2012-02-28 NOTE — Patient Instructions (Signed)
Embarazo - Segundo trimestre (Pregnancy - Second Trimester) El segundo trimestre del embarazo (del 3 al 6mes) es un perodo de evolucin rpida para usted y el beb. Hacia el final del sexto mes, el beb mide aproximadamente 23 cm y pesa 680 g. Comenzar a sentir los movimientos del beb entre las 18 y las 20 semanas de embarazo. Podr sentir las pataditas ("quickening en ingls"). Hay un rpido aumento de peso. Puede segregar un lquido claro (calostro) de las mamas. Quizs sienta pequeas contracciones en el vientre (tero) Esto se conoce como falso trabajo de parto o contracciones de Braxton-Hicks. Es como una prctica del trabajo de parto que se produce cuando el beb est listo para salir. Generalmente los problemas de vmitos matinales ya se han superado hacia el final del primer trimestre. Algunas mujeres desarrollan pequeas manchas oscuras (que se denominan cloasma, mscara del embarazo) en la cara que normalmente se van luego del nacimiento del beb. La exposicin al sol empeora las manchas. Puede desarrollarse acn en algunas mujeres embarazadas, y puede desaparecer en aquellas que ya tienen acn. EXAMENES PRENATALES  Durante los exmenes prenatales, deber seguir realizando pruebas de sangre, segn avance el embarazo. Estas pruebas se realizan para controlar su salud y la del beb. Tambin se realizan anlisis de sangre para conocer los niveles de hemoglobina. La anemia (bajo nivel de hemoglobina) es frecuente durante el embarazo. Para prevenirla, se administran hierro y vitaminas. Tambin se le realizarn exmenes para saber si tiene diabetes entre las 24 y las 28 semanas del embarazo. Podrn repetirle algunas de las pruebas que le hicieron previamente.  En cada visita le medirn el tamao del tero. Esto se realiza para asegurarse de que el beb est creciendo correctamente de acuerdo al estado del embarazo.  Tambin en cada visita prenatal controlarn su presin arterial. Esto se realiza  para asegurarse de que no tenga toxemia.  Se controlar su orina para asegurarse de que no tenga infecciones, diabetes o protena en la orina.  Se controlar su peso regularmente para asegurarse que el aumento ocurre al ritmo indicado. Esto se hace para asegurarse que usted y el beb tienen una evolucin normal.  En algunas ocasiones se realiza una prueba de ultrasonido para confirmar el correcto desarrollo y evolucin del beb. Esta prueba se realiza con ondas sonoras inofensivas para el beb, de modo que el profesional pueda calcular ms precisamente la fecha del parto. Algunas veces se realizan pruebas especializadas del lquido amnitico que rodea al beb. Esta prueba se denomina amniocentesis. El lquido amnitico se obtiene introduciendo una aguja en el vientre (abdomen). Se realiza para controlar los cromosomas en aquellos casos en los que existe alguna preocupacin acerca de algn problema gentico que pueda sufrir el beb. En ocasiones se lleva a cabo cerca del final del embarazo, si es necesario inducir al parto. En este caso se realiza para asegurarse que los pulmones del beb estn lo suficientemente maduros como para que pueda vivir fuera del tero. CAMBIOS QUE OCURREN EN EL SEGUNDO TRIMESTRE DEL EMBARAZO Su organismo atravesar numerosos cambios durante el embarazo. Estos pueden variar de una persona a otra. Converse con el profesional que la asiste acerca los cambios que usted note y que la preocupen.  Durante el segundo trimestre probablemente sienta un aumento del apetito. Es normal tener "antojos" de ciertas comidas. Esto vara de una persona a otra y de un embarazo a otro.  El abdomen inferior comenzar a abultarse.  Podr tener la necesidad de orinar con ms frecuencia debido a que   el tero y el beb presionan sobre la vejiga. Tambin es frecuente contraer ms infecciones urinarias durante el embarazo (dolor al orinar). Puede evitarlas bebiendo gran cantidad de lquidos y vaciando  la vejiga antes y despus de mantener relaciones sexuales.  Podrn aparecer las primeras estras en las caderas, abdomen y mamas. Estos son cambios normales del cuerpo durante el embarazo. No existen medicamentos ni ejercicios que puedan prevenir estos cambios.  Es posible que comience a desarrollar venas inflamadas y abultadas (varices) en las piernas. El uso de medias de descanso, elevar sus pies durante 15 minutos, 3 a 4 veces al da y limitar la sal en su dieta ayuda a aliviar el problema.  Podr sentir acidez gstrica a medida que el tero crece y presiona contra el estmago. Puede tomar anticidos, con la autorizacin de su mdico, para aliviar este problema. Tambin es til ingerir pequeas comidas 4 a 5 veces al da.  La constipacin puede tratarse con un laxante o agregando fibra a su dieta. Beber grandes cantidades de lquidos, comer vegetales, frutas y granos integrales es de gran ayuda.  Tambin es beneficioso practicar actividad fsica. Si ha sido una persona activa hasta el embarazo, podr continuar con la mayora de las actividades durante el mismo. Si ha sido menos activa, puede ser beneficioso que comience con un programa de ejercicios, como realizar caminatas.  Puede desarrollar hemorroides (vrices en el recto) hacia el final del segundo trimestre. Tomar baos de asiento tibios y utilizar cremas recomendadas por el profesional que lo asiste sern de ayuda para los problemas de hemorroides.  Tambin podr sentir dolor de espalda durante este momento de su embarazo. Evite levantar objetos pesados, utilice zapatos de taco bajo y mantenga una buena postura para ayudar a reducir los problemas de espalda.  Algunas mujeres embarazadas desarrollan hormigueo y adormecimiento de la mano y los dedos debido a la hinchazn y compresin de los ligamentos de la mueca (sndrome del tnel carpiano). Esto desaparece una vez que el beb nace.  Como sus pechos se agrandan, necesitar un sujetador  ms grande. Use un sostn de soporte, cmodo y de algodn. No utilice un sostn para amamantar hasta el ltimo mes de embarazo si va a amamantar al beb.  Podr observar una lnea oscura desde el ombligo hacia la zona pbica denominada linea nigra.  Podr observar que sus mejillas se ponen coloradas debido al aumento de flujo sanguneo en la cara.  Podr desarrollar "araitas" en la cara, cuello y pecho. Esto desaparece una vez que el beb nace. INSTRUCCIONES PARA EL CUIDADO DOMICILIARIO  Es extremadamente importante que evite el cigarrillo, hierbas medicinales, alcohol y las drogas no prescriptas durante el embarazo. Estas sustancias qumicas afectan la formacin y el desarrollo del beb. Evite estas sustancias durante todo el embarazo para asegurar el nacimiento de un beb sano.  La mayor parte de los cuidados que se aconsejan son los mismos que los indicados para el primer trimestre del embarazo. Cumpla con las citas tal como se le indic. Siga las instrucciones del profesional que lo asiste con respecto al uso de los medicamentos, el ejercicio y la dieta.  Durante el embarazo debe obtener nutrientes para usted y para su beb. Consuma alimentos balanceados a intervalos regulares. Elija alimentos como carne, pescado, leche y otros productos lcteos descremados, vegetales, frutas, panes integrales y cereales. El profesional le informar cul es el aumento de peso ideal.  Las relaciones sexuales fsicas pueden continuarse hasta cerca del fin del embarazo si no existen otros problemas. Estos   problemas pueden ser la prdida temprana (prematura) de lquido amnitico de las membranas, sangrado vaginal, dolor abdominal u otros problemas mdicos o del embarazo.  Realice actividad fsica todos los das, si no tiene restricciones. Consulte con el profesional que la asiste si no sabe con certeza si determinados ejercicios son seguros. El mayor aumento de peso tiene lugar durante los ltimos 2 trimestres del  embarazo. El ejercicio la ayudar a:  Controlar su peso.  Ponerla en forma para el parto.  Ayudarla a perder peso luego de haber dado a luz.  Use un buen sostn o como los que se usan para hacer deportes para aliviar la sensibilidad de las mamas. Tambin puede serle til si lo usa mientras duerme. Si pierde calostro, podr utilizar apsitos en el sostn.  No utilice la baera con agua caliente, baos turcos y saunas durante el embarazo.  Utilice el cinturn de seguridad sin excepcin cuando conduzca. Este la proteger a usted y al beb en caso de accidente.  Evite comer carne cruda, queso crudo, y el contacto con los utensilios y desperdicios de los gatos. Estos elementos contienen grmenes que pueden causar defectos de nacimiento en el beb.  El segundo trimestre es un buen momento para visitar a su dentista y evaluar su salud dental si an no lo ha hecho. Es importante mantener los dientes limpios. Utilice un cepillo de dientes blando. Cepllese ms suavemente durante el embarazo.  Es ms fcil perder algo de orina durante el embarazo. Apretar y fortalecer los msculos de la pelvis la ayudar con este problema. Practique detener la miccin cuando est en el bao. Estos son los mismos msculos que necesita fortalecer. Son tambin los mismos msculos que utiliza cuando trata de evitar los gases. Puede practicar apretando estos msculos 10 veces, y repetir esto 3 veces por da aproximadamente. Una vez que conozca qu msculos debe apretar, no realice estos ejercicios durante la miccin. Puede favorecerle una infeccin si la orina vuelve hacia atrs.  Pida ayuda si tiene necesidades econmicas, de asesoramiento o nutricionales durante el embarazo. El profesional podr ayudarla con respecto a estas necesidades, o derivarla a otros especialistas.  La piel puede ponerse grasa. Si esto sucede, lvese la cara con un jabn suave, utilice un humectante no graso y maquillaje con base de aceite o  crema. CONSUMO DE MEDICAMENTOS Y DROGAS DURANTE EL EMBARAZO  Contine tomando las vitaminas apropiadas para esta etapa tal como se le indic. Las vitaminas deben contener un miligramo de cido flico y deben suplementarse con hierro. Guarde todas las vitaminas fuera del alcance de los nios. La ingestin de slo un par de vitaminas o tabletas que contengan hierro puede ocasionar la muerte en un beb o en un nio pequeo.  Evite el uso de medicamentos, inclusive los de venta libre y hierbas que no hayan sido prescriptos o indicados por el profesional que la asiste. Algunos medicamentos pueden causar problemas fsicos al beb. Utilice los medicamentos de venta libre o de prescripcin para el dolor, el malestar o la fiebre, segn se lo indique el profesional que lo asiste. No utilice aspirina.  El consumo de alcohol est relacionado con ciertos defectos de nacimiento. Esto incluye el sndrome de alcoholismo fetal. Debe evitar el consumo de alcohol en cualquiera de sus formas. El cigarrillo causa nacimientos prematuros y bebs de bajo peso. El uso de drogas recreativas est absolutamente prohibido. Son muy nocivas para el beb. Un beb que nace de una madre adicta, ser adicto al nacer. Ese beb tendr los mismos   sntomas de abstinencia que un adulto.  Infrmele al profesional si consume alguna droga.  No consuma drogas ilegales. Pueden causarle mucho dao al beb. SOLICITE ATENCIN MDICA SI: Tiene preguntas o preocupaciones durante su embarazo. Es mejor que llame para consultar las dudas que esperar hasta su prxima visita prenatal. De esta forma se sentir ms tranquila.  SOLICITE ATENCIN MDICA DE INMEDIATO SI:  La temperatura oral se eleva sin motivo por encima de 102 F (38.9 C) o segn le indique el profesional que lo asiste.  Tiene una prdida de lquido por la vagina (canal de parto). Si sospecha una ruptura de las membranas, tmese la temperatura y llame al profesional para informarlo sobre  esto.  Observa unas pequeas manchas, una hemorragia vaginal o elimina cogulos. Notifique al profesional acerca de la cantidad y de cuntos apsitos est utilizando. Unas pequeas manchas de sangre son algo comn durante el embarazo, especialmente despus de mantener relaciones sexuales.  Presenta un olor desagradable en la secrecin vaginal y observa un cambio en el color, de transparente a blanco.  Contina con las nuseas y no obtiene alivio de los remedios indicados. Vomita sangre o algo similar a la borra del caf.  [place] o sube ms de 900 g. en una semana, o segn lo indicado por el profesional que la asiste.  Observa que se le hinchan el rostro, las manos, los pies o las piernas.  Ha estado expuesta a la rubola y no ha sufrido la enfermedad.  Ha estado expuesta a la quinta enfermedad o a la varicela.  Presenta dolor abdominal. Las molestias en el ligamento redondo son una causa no cancerosa (benigna) frecuente de dolor abdominal durante el embarazo. El profesional que la asiste deber evaluarla.  Presenta dolor de cabeza intenso que no se alivia.  Presenta fiebre, diarrea, dolor al orinar o le falta la respiracin.  Presenta dificultad para ver, visin borrosa, o visin doble.  Sufre una cada, un accidente de trnsito o cualquier tipo de trauma.  Vive en un hogar en el que existe violencia fsica o mental. Document Released: 10/19/2004 Document Revised: 04/03/2011 [employer] Patient Information 2013 [employer].  

## 2012-02-28 NOTE — Progress Notes (Signed)
Pulse 90  Edema trace in feet.

## 2012-02-28 NOTE — Addendum Note (Signed)
Addended by: Franchot Mimes on: 02/28/2012 02:52 PM   Modules accepted: Orders

## 2012-02-29 LAB — HIV ANTIBODY (ROUTINE TESTING W REFLEX): HIV: NONREACTIVE

## 2012-02-29 LAB — OBSTETRIC PANEL
Eosinophils Relative: 2 % (ref 0–5)
HCT: 34.2 % — ABNORMAL LOW (ref 36.0–46.0)
Hepatitis B Surface Ag: NEGATIVE
Lymphocytes Relative: 12 % (ref 12–46)
Lymphs Abs: 1.2 10*3/uL (ref 0.7–4.0)
MCV: 87.7 fL (ref 78.0–100.0)
Neutro Abs: 7.7 10*3/uL (ref 1.7–7.7)
Platelets: 173 10*3/uL (ref 150–400)
RBC: 3.9 MIL/uL (ref 3.87–5.11)
Rubella: 14.2 Index — ABNORMAL HIGH (ref <0.90)
WBC: 9.6 10*3/uL (ref 4.0–10.5)

## 2012-03-01 LAB — HEMOGLOBINOPATHY EVALUATION
Hemoglobin Other: 0 %
Hgb F Quant: 0 % (ref 0.0–2.0)
Hgb S Quant: 0 %

## 2012-03-27 ENCOUNTER — Other Ambulatory Visit: Payer: Self-pay | Admitting: Obstetrics and Gynecology

## 2012-03-27 ENCOUNTER — Ambulatory Visit (INDEPENDENT_AMBULATORY_CARE_PROVIDER_SITE_OTHER): Payer: Self-pay | Admitting: Obstetrics and Gynecology

## 2012-03-27 ENCOUNTER — Encounter: Payer: Self-pay | Admitting: Obstetrics and Gynecology

## 2012-03-27 VITALS — BP 113/70 | Temp 98.2°F | Wt 165.2 lb

## 2012-03-27 DIAGNOSIS — O34219 Maternal care for unspecified type scar from previous cesarean delivery: Secondary | ICD-10-CM

## 2012-03-27 DIAGNOSIS — H66009 Acute suppurative otitis media without spontaneous rupture of ear drum, unspecified ear: Secondary | ICD-10-CM

## 2012-03-27 DIAGNOSIS — H6501 Acute serous otitis media, right ear: Secondary | ICD-10-CM

## 2012-03-27 LAB — POCT URINALYSIS DIP (DEVICE)
Bilirubin Urine: NEGATIVE
Glucose, UA: NEGATIVE mg/dL
Leukocytes, UA: NEGATIVE
Nitrite: NEGATIVE
Urobilinogen, UA: 0.2 mg/dL (ref 0.0–1.0)

## 2012-03-27 MED ORDER — PSEUDOEPHEDRINE HCL 30 MG PO TABS: 30.0000 mg | ORAL_TABLET | ORAL | Status: DC | PRN

## 2012-03-27 NOTE — Patient Instructions (Signed)
Embarazo - Segundo trimestre (Pregnancy - Second Trimester) El segundo trimestre del embarazo (del 3 al 6mes) es un perodo de evolucin rpida para usted y el beb. Hacia el final del sexto mes, el beb mide aproximadamente 23 cm y pesa 680 g. Comenzar a sentir los movimientos del beb entre las 18 y las 20 semanas de embarazo. Podr sentir las pataditas ("quickening en ingls"). Hay un rpido aumento de peso. Puede segregar un lquido claro (calostro) de las mamas. Quizs sienta pequeas contracciones en el vientre (tero) Esto se conoce como falso trabajo de parto o contracciones de Braxton-Hicks. Es como una prctica del trabajo de parto que se produce cuando el beb est listo para salir. Generalmente los problemas de vmitos matinales ya se han superado hacia el final del primer trimestre. Algunas mujeres desarrollan pequeas manchas oscuras (que se denominan cloasma, mscara del embarazo) en la cara que normalmente se van luego del nacimiento del beb. La exposicin al sol empeora las manchas. Puede desarrollarse acn en algunas mujeres embarazadas, y puede desaparecer en aquellas que ya tienen acn. EXAMENES PRENATALES  Durante los exmenes prenatales, deber seguir realizando pruebas de sangre, segn avance el embarazo. Estas pruebas se realizan para controlar su salud y la del beb. Tambin se realizan anlisis de sangre para conocer los niveles de hemoglobina. La anemia (bajo nivel de hemoglobina) es frecuente durante el embarazo. Para prevenirla, se administran hierro y vitaminas. Tambin se le realizarn exmenes para saber si tiene diabetes entre las 24 y las 28 semanas del embarazo. Podrn repetirle algunas de las pruebas que le hicieron previamente.  En cada visita le medirn el tamao del tero. Esto se realiza para asegurarse de que el beb est creciendo correctamente de acuerdo al estado del embarazo.  Tambin en cada visita prenatal controlarn su presin arterial. Esto se realiza  para asegurarse de que no tenga toxemia.  Se controlar su orina para asegurarse de que no tenga infecciones, diabetes o protena en la orina.  Se controlar su peso regularmente para asegurarse que el aumento ocurre al ritmo indicado. Esto se hace para asegurarse que usted y el beb tienen una evolucin normal.  En algunas ocasiones se realiza una prueba de ultrasonido para confirmar el correcto desarrollo y evolucin del beb. Esta prueba se realiza con ondas sonoras inofensivas para el beb, de modo que el profesional pueda calcular ms precisamente la fecha del parto. Algunas veces se realizan pruebas especializadas del lquido amnitico que rodea al beb. Esta prueba se denomina amniocentesis. El lquido amnitico se obtiene introduciendo una aguja en el vientre (abdomen). Se realiza para controlar los cromosomas en aquellos casos en los que existe alguna preocupacin acerca de algn problema gentico que pueda sufrir el beb. En ocasiones se lleva a cabo cerca del final del embarazo, si es necesario inducir al parto. En este caso se realiza para asegurarse que los pulmones del beb estn lo suficientemente maduros como para que pueda vivir fuera del tero. CAMBIOS QUE OCURREN EN EL SEGUNDO TRIMESTRE DEL EMBARAZO Su organismo atravesar numerosos cambios durante el embarazo. Estos pueden variar de una persona a otra. Converse con el profesional que la asiste acerca los cambios que usted note y que la preocupen.  Durante el segundo trimestre probablemente sienta un aumento del apetito. Es normal tener "antojos" de ciertas comidas. Esto vara de una persona a otra y de un embarazo a otro.  El abdomen inferior comenzar a abultarse.  Podr tener la necesidad de orinar con ms frecuencia debido a que   el tero y el beb presionan sobre la vejiga. Tambin es frecuente contraer ms infecciones urinarias durante el embarazo (dolor al orinar). Puede evitarlas bebiendo gran cantidad de lquidos y vaciando  la vejiga antes y despus de mantener relaciones sexuales.  Podrn aparecer las primeras estras en las caderas, abdomen y mamas. Estos son cambios normales del cuerpo durante el embarazo. No existen medicamentos ni ejercicios que puedan prevenir estos cambios.  Es posible que comience a desarrollar venas inflamadas y abultadas (varices) en las piernas. El uso de medias de descanso, elevar sus pies durante 15 minutos, 3 a 4 veces al da y limitar la sal en su dieta ayuda a aliviar el problema.  Podr sentir acidez gstrica a medida que el tero crece y presiona contra el estmago. Puede tomar anticidos, con la autorizacin de su mdico, para aliviar este problema. Tambin es til ingerir pequeas comidas 4 a 5 veces al da.  La constipacin puede tratarse con un laxante o agregando fibra a su dieta. Beber grandes cantidades de lquidos, comer vegetales, frutas y granos integrales es de gran ayuda.  Tambin es beneficioso practicar actividad fsica. Si ha sido una persona activa hasta el embarazo, podr continuar con la mayora de las actividades durante el mismo. Si ha sido menos activa, puede ser beneficioso que comience con un programa de ejercicios, como realizar caminatas.  Puede desarrollar hemorroides (vrices en el recto) hacia el final del segundo trimestre. Tomar baos de asiento tibios y utilizar cremas recomendadas por el profesional que lo asiste sern de ayuda para los problemas de hemorroides.  Tambin podr sentir dolor de espalda durante este momento de su embarazo. Evite levantar objetos pesados, utilice zapatos de taco bajo y mantenga una buena postura para ayudar a reducir los problemas de espalda.  Algunas mujeres embarazadas desarrollan hormigueo y adormecimiento de la mano y los dedos debido a la hinchazn y compresin de los ligamentos de la mueca (sndrome del tnel carpiano). Esto desaparece una vez que el beb nace.  Como sus pechos se agrandan, necesitar un sujetador  ms grande. Use un sostn de soporte, cmodo y de algodn. No utilice un sostn para amamantar hasta el ltimo mes de embarazo si va a amamantar al beb.  Podr observar una lnea oscura desde el ombligo hacia la zona pbica denominada linea nigra.  Podr observar que sus mejillas se ponen coloradas debido al aumento de flujo sanguneo en la cara.  Podr desarrollar "araitas" en la cara, cuello y pecho. Esto desaparece una vez que el beb nace. INSTRUCCIONES PARA EL CUIDADO DOMICILIARIO  Es extremadamente importante que evite el cigarrillo, hierbas medicinales, alcohol y las drogas no prescriptas durante el embarazo. Estas sustancias qumicas afectan la formacin y el desarrollo del beb. Evite estas sustancias durante todo el embarazo para asegurar el nacimiento de un beb sano.  La mayor parte de los cuidados que se aconsejan son los mismos que los indicados para el primer trimestre del embarazo. Cumpla con las citas tal como se le indic. Siga las instrucciones del profesional que lo asiste con respecto al uso de los medicamentos, el ejercicio y la dieta.  Durante el embarazo debe obtener nutrientes para usted y para su beb. Consuma alimentos balanceados a intervalos regulares. Elija alimentos como carne, pescado, leche y otros productos lcteos descremados, vegetales, frutas, panes integrales y cereales. El profesional le informar cul es el aumento de peso ideal.  Las relaciones sexuales fsicas pueden continuarse hasta cerca del fin del embarazo si no existen otros problemas. Estos   problemas pueden ser la prdida temprana (prematura) de lquido amnitico de las membranas, sangrado vaginal, dolor abdominal u otros problemas mdicos o del embarazo.  Realice actividad fsica todos los das, si no tiene restricciones. Consulte con el profesional que la asiste si no sabe con certeza si determinados ejercicios son seguros. El mayor aumento de peso tiene lugar durante los ltimos 2 trimestres del  embarazo. El ejercicio la ayudar a:  Controlar su peso.  Ponerla en forma para el parto.  Ayudarla a perder peso luego de haber dado a luz.  Use un buen sostn o como los que se usan para hacer deportes para aliviar la sensibilidad de las mamas. Tambin puede serle til si lo usa mientras duerme. Si pierde calostro, podr utilizar apsitos en el sostn.  No utilice la baera con agua caliente, baos turcos y saunas durante el embarazo.  Utilice el cinturn de seguridad sin excepcin cuando conduzca. Este la proteger a usted y al beb en caso de accidente.  Evite comer carne cruda, queso crudo, y el contacto con los utensilios y desperdicios de los gatos. Estos elementos contienen grmenes que pueden causar defectos de nacimiento en el beb.  El segundo trimestre es un buen momento para visitar a su dentista y evaluar su salud dental si an no lo ha hecho. Es importante mantener los dientes limpios. Utilice un cepillo de dientes blando. Cepllese ms suavemente durante el embarazo.  Es ms fcil perder algo de orina durante el embarazo. Apretar y fortalecer los msculos de la pelvis la ayudar con este problema. Practique detener la miccin cuando est en el bao. Estos son los mismos msculos que necesita fortalecer. Son tambin los mismos msculos que utiliza cuando trata de evitar los gases. Puede practicar apretando estos msculos 10 veces, y repetir esto 3 veces por da aproximadamente. Una vez que conozca qu msculos debe apretar, no realice estos ejercicios durante la miccin. Puede favorecerle una infeccin si la orina vuelve hacia atrs.  Pida ayuda si tiene necesidades econmicas, de asesoramiento o nutricionales durante el embarazo. El profesional podr ayudarla con respecto a estas necesidades, o derivarla a otros especialistas.  La piel puede ponerse grasa. Si esto sucede, lvese la cara con un jabn suave, utilice un humectante no graso y maquillaje con base de aceite o  crema. CONSUMO DE MEDICAMENTOS Y DROGAS DURANTE EL EMBARAZO  Contine tomando las vitaminas apropiadas para esta etapa tal como se le indic. Las vitaminas deben contener un miligramo de cido flico y deben suplementarse con hierro. Guarde todas las vitaminas fuera del alcance de los nios. La ingestin de slo un par de vitaminas o tabletas que contengan hierro puede ocasionar la muerte en un beb o en un nio pequeo.  Evite el uso de medicamentos, inclusive los de venta libre y hierbas que no hayan sido prescriptos o indicados por el profesional que la asiste. Algunos medicamentos pueden causar problemas fsicos al beb. Utilice los medicamentos de venta libre o de prescripcin para el dolor, el malestar o la fiebre, segn se lo indique el profesional que lo asiste. No utilice aspirina.  El consumo de alcohol est relacionado con ciertos defectos de nacimiento. Esto incluye el sndrome de alcoholismo fetal. Debe evitar el consumo de alcohol en cualquiera de sus formas. El cigarrillo causa nacimientos prematuros y bebs de bajo peso. El uso de drogas recreativas est absolutamente prohibido. Son muy nocivas para el beb. Un beb que nace de una madre adicta, ser adicto al nacer. Ese beb tendr los mismos   sntomas de abstinencia que un adulto.  Infrmele al profesional si consume alguna droga.  No consuma drogas ilegales. Pueden causarle mucho dao al beb. SOLICITE ATENCIN MDICA SI: Tiene preguntas o preocupaciones durante su embarazo. Es mejor que llame para consultar las dudas que esperar hasta su prxima visita prenatal. De esta forma se sentir ms tranquila.  SOLICITE ATENCIN MDICA DE INMEDIATO SI:  La temperatura oral se eleva sin motivo por encima de 102 F (38.9 C) o segn le indique el profesional que lo asiste.  Tiene una prdida de lquido por la vagina (canal de parto). Si sospecha una ruptura de las membranas, tmese la temperatura y llame al profesional para informarlo sobre  esto.  Observa unas pequeas manchas, una hemorragia vaginal o elimina cogulos. Notifique al profesional acerca de la cantidad y de cuntos apsitos est utilizando. Unas pequeas manchas de sangre son algo comn durante el embarazo, especialmente despus de mantener relaciones sexuales.  Presenta un olor desagradable en la secrecin vaginal y observa un cambio en el color, de transparente a blanco.  Contina con las nuseas y no obtiene alivio de los remedios indicados. Vomita sangre o algo similar a la borra del caf.  [place] o sube ms de 900 g. en una semana, o segn lo indicado por el profesional que la asiste.  Observa que se le hinchan el rostro, las manos, los pies o las piernas.  Ha estado expuesta a la rubola y no ha sufrido la enfermedad.  Ha estado expuesta a la quinta enfermedad o a la varicela.  Presenta dolor abdominal. Las molestias en el ligamento redondo son una causa no cancerosa (benigna) frecuente de dolor abdominal durante el embarazo. El profesional que la asiste deber evaluarla.  Presenta dolor de cabeza intenso que no se alivia.  Presenta fiebre, diarrea, dolor al orinar o le falta la respiracin.  Presenta dificultad para ver, visin borrosa, o visin doble.  Sufre una cada, un accidente de trnsito o cualquier tipo de trauma.  Vive en un hogar en el que existe violencia fsica o mental. Document Released: 10/19/2004 Document Revised: 04/03/2011 [employer] Patient Information 2013 [employer].  

## 2012-03-27 NOTE — Progress Notes (Signed)
On PNVs. Husband to get vasectomy. C/o right ear pain x weeks. Denies URI sx. TM: gray, no LR seen, no inflammation.

## 2012-03-27 NOTE — Progress Notes (Signed)
Pulse- 96 Patient reports some swelling in her hands and feet 1 hr gtt @ 1040

## 2012-03-28 LAB — GLUCOSE TOLERANCE, 1 HOUR (50G) W/O FASTING: Glucose, 1 Hour GTT: 90 mg/dL (ref 70–140)

## 2012-04-10 ENCOUNTER — Other Ambulatory Visit: Payer: Self-pay | Admitting: Advanced Practice Midwife

## 2012-04-10 ENCOUNTER — Ambulatory Visit (INDEPENDENT_AMBULATORY_CARE_PROVIDER_SITE_OTHER): Payer: Self-pay | Admitting: Advanced Practice Midwife

## 2012-04-10 VITALS — BP 107/68 | Temp 97.1°F | Wt 166.7 lb

## 2012-04-10 DIAGNOSIS — R070 Pain in throat: Secondary | ICD-10-CM

## 2012-04-10 DIAGNOSIS — O34219 Maternal care for unspecified type scar from previous cesarean delivery: Secondary | ICD-10-CM

## 2012-04-10 LAB — POCT URINALYSIS DIP (DEVICE)
Bilirubin Urine: NEGATIVE
Glucose, UA: NEGATIVE mg/dL
Ketones, ur: NEGATIVE mg/dL
Leukocytes, UA: NEGATIVE
Nitrite: NEGATIVE

## 2012-04-10 NOTE — Patient Instructions (Signed)
Embarazo  Tercer trimestre  (Pregnancy - Third Trimester) El tercer trimestre del embarazo (los ltimos 3 meses) es el perodo en el cual tanto usted como su beb crecen con ms rapidez. El beb alcanza un largo de aproximadamente 50 cm. y pesa entre 2,700 y 4,500 kg. El beb gana ms tejido graso y est listo para la vida fuera del cuerpo de la madre. Mientras estn en el interior, los bebs tienen perodos de sueo y vigilia, succionan el pulgar y tienen hipo. Quizs sienta pequeas contracciones del tero. Este es el falso trabajo de parto. Tambin se las conoce como contracciones de Braxton-Hicks . Es como una prctica del parto. Los problemas ms habituales de esta etapa del embarazo incluyen mayor dificultad para respirar, hinchazn de las manos y los pies por retencin de lquidos y la necesidad de orinar con ms frecuencia debido a que el tero y el beb presionan sobre la vejiga.  EXAMENES PRENATALES   Durante los exmenes prenatales, deber seguir realizndose anlisis de sangre. Estas pruebas se realizan para controlar su salud y la del beb. Los anlisis de sangre se realizan para conocer los niveles de algunos compuestos de la sangre (hemoglobina). La anemia (bajo nivel de hemoglobina) es frecuente durante el embarazo. Para prevenirla, se administran hierro y vitaminas. Tambin le tomarn nuevas anlisis para descartar diabetes. Podrn repetirle algunas de las pruebas que le hicieron previamente.  En cada visita le medirn el tamao del tero. Esto permite asegurar que el beb se desarrolla adecuadamente, segn la fecha del embarazo.  Le controlarn la presin arterial en cada visita prenatal. Esto es para asegurarse de que no sufre toxemia.  Le harn un anlisis de orina en cada visita prenatal, para descartar infecciones, diabetes y la presencia de protenas.  Tambin en cada visita controlarn su peso. Esto se realiza para asegurarse que aumenta de peso al ritmo indicado y que usted y su  beb evolucionan normalmente.  En algunas ocasiones se realiza una prueba de ultrasonido para confirmar el correcto desarrollo y evolucin del beb. Esta prueba se realiza con ondas sonoras inofensivas para el beb, de modo que el profesional pueda calcular ms precisamente la fecha del parto.  Analice con su mdico los analgsicos y la anestesia que recibir durante el trabajo de parto y el parto.  Comente la posibilidad de que necesite una cesrea y qu anestesia se recibir.  Informe a su mdico si sufre violencia familiar mental o fsica. A veces, se indica la prueba especializada sin estrs, la prueba de tolerancia a las contracciones y el perfil biofsico para asegurarse de que el beb no tiene problemas. El estudio del lquido amnitico que rodea al beb se llama amniocentesis. El lquido amnitico se obtiene introduciendo una aguja en el vientre (abdomen ). En ocasiones se lleva a cabo cerca del final del embarazo, si es necesario inducir a un parto. En este caso se realiza para asegurarse que los pulmones del beb estn lo suficientemente maduros como para que pueda vivir fuera del tero. Si los pulmones no han madurado y es peligroso que el beb nazca, se administrar a la madre una inyeccin de cortisona , 1 a 2 das antes del parto. . Esto ayuda a que los pulmones del beb maduren y sea ms seguro su nacimiento.  CAMBIOS QUE OCURREN EN EL TERCER TRIMESTRE DEL EMBARAZO  Su organismo atravesar numerosos cambios durante el embarazo. Estos pueden variar de una persona a otra. Converse con el profesional que la asiste acerca los cambios que   usted note y que la preocupen.   Durante el ltimo trimestre probablemente sienta un aumento del apetito. Es normal tener "antojos" de ciertas comidas. Esto vara de una persona a otra y de un embarazo a otro.  Podrn aparecer las primeras estras en las caderas, abdomen y mamas. Estos son cambios normales del cuerpo durante el embarazo. No existen  medicamentos ni ejercicios que puedan prevenir estos cambios.  La constipacin puede tratarse con un laxante o agregando fibra a su dieta. Beber grandes cantidades de lquidos, tomar fibras en forma de vegetales, frutas y granos integrales es de gran ayuda.  Tambin es beneficioso practicar actividad fsica. Si ha sido una persona activa hasta el embarazo, podr continuar con la mayora de las actividades durante el mismo. Si ha sido menos activa, puede ser beneficioso que comience con un programa de ejercicios, como realizar caminatas. Consulte con el profesional que la asiste antes de comenzar un programa de ejercicios.  Evite el consumo de cigarrillos, el alcohol, los medicamentos no recetados y las "drogas de la calle" durante el embarazo. Estas sustancias qumicas afectan la formacin y el desarrollo del beb. Evite estas sustancias durante todo el embarazo para asegurar el nacimiento de un beb sano.  Podr sentir dolor de espalda, tener vrices en las venas y hemorroides, o si ya los sufra, pueden empeorar.  Durante el tercer trimestre se cansar con ms facilidad, lo cual es normal.  Los movimientos del beb pueden ser ms fuertes y con ms frecuencia.  Puede que note dificultades para respirar normalmente.  El ombligo puede salir hacia afuera.  A veces sale una secrecin amarilla de las mamas, que se llama calostro.  Podr aparecer una secrecin mucosa con sangre. Esto suele ocurrir entre unos pocos das y una semana antes del parto. INSTRUCCIONES PARA EL CUIDADO EN EL HOGAR   Cumpla con las citas de control. Siga las indicaciones del mdico con respecto al uso de medicamentos, los ejercicios y la dieta.  Durante el embarazo debe obtener nutrientes para usted y para su beb. Consuma alimentos balanceados a intervalos regulares. Elija alimentos como carne, pescado, leche y otros productos lcteos descremados, vegetales, frutas, panes integrales y cereales. El mdico le informar  cul es el aumento de peso ideal.  Las relaciones sexuales pueden continuarse hasta casi el final del embarazo, si no se presentan otros problemas como prdida prematura (antes de tiempo) de lquido amnitico, hemorragia vaginal o dolor en el vientre (abdominal).  Realice actividad fsica todos los das, si no tiene restricciones. Consulte con el profesional que la asiste si no sabe con certeza si determinados ejercicios son seguros. El mayor aumento de peso se producir en los ltimos 2 trimestres del embarazo. El ejercicio ayuda a:  Controlar su peso.  Mantenerse en forma para el trabajo de parto y el parto .  Perder peso despus del parto.  Haga reposo con frecuencia, con las piernas elevadas, o segn lo necesite para evitar los calambres y el dolor de cintura.  Use un buen sostn o como los que se usan para hacer deportes para aliviar la sensibilidad de las mamas. Tambin puede serle til si lo usa mientras duerme. Si pierde calostro, podr utilizar apsitos en el sostn.  No utilice la baera con agua caliente, baos turcos y saunas.  Colquese el cinturn de seguridad cuando conduzca. Este la proteger a usted y al beb en caso de accidente.  Evite comer carne cruda y el contacto con los utensilios y desperdicios de los gatos. Estos elementos   contienen grmenes que pueden causar defectos de nacimiento en el beb.  Es fcil perder algo de orina durante el embarazo. Apretar y fortalecer los msculos de la pelvis la ayudar con este problema. Practique detener la miccin cuando est en el bao. Estos son los mismos msculos que necesita fortalecer. Son tambin los mismos msculos que utiliza cuando trata de evitar despedir gases. Puede practicar apretando estos msculos diez veces, y repetir esto tres veces por da aproximadamente. Una vez que conozca qu msculos debe apretar, no realice estos ejercicios durante la miccin. Puede favorecerle una infeccin si la orina vuelve hacia  atrs.  Pida ayuda si tienen necesidades financieras, teraputicas o nutricionales. El profesional podr ayudarla con respecto a estas necesidades, o derivarla a otros especialistas.  Haga una lista de nmeros telefnicos de emergencia y tngalos disponibles.  Planifique como obtener ayuda de familiares o amigos cuando regrese a casa desde el hospital.  Hacer un ensayo sobre la partida al hospital.  Tome clases prenatales con el padre para entender, practicar y hacer preguntas sobre el trabajo de parto y el alumbramiento.  Preparar la habitacin del beb / busque una guardera.  No viaje fuera de la ciudad a menos que sea absolutamente necesario y con el asesoramiento de su mdico.  Use slo zapatos de tacn bajo o sin tacn para tener mejor equilibrio y evitar cadas. USO DE MEDICAMENTOS Y CONSUMO DE DROGAS DURANTE EL EMBARAZO   Tome las vitaminas apropiadas para esta etapa tal como se le indic. Las vitaminas deben contener un miligramo de cido flico. Guarde todas las vitaminas fuera del alcance de los nios. La ingestin de slo un par de vitaminas o tabletas que contengan hierro pueden ocasionar la muerte en un beb o en un nio pequeo.  Evite el uso de todos los medicamentos, incluyendo hierbas, medicamentos de venta libre, sin receta o que no hayan sido sugeridos por su mdico. Slo tome medicamentos de venta libre o medicamentos recetados para el dolor, el malestar o fiebre como lo indique su mdico. No tome aspirina, ibuprofeno (Motrin, Advil, Nuprin) o naproxeno (Aleve) excepto que su mdico se lo indique.  Infrmele al profesional si consume alguna droga.  El alcohol se relaciona con ciertos defectos congnitos. Incluye el sndrome de alcoholismo fetal. Debe evitar absolutamente el consumo de alcohol, en cualquier forma. El fumar produce baja tasa de natalidad y bebs prematuros.  Las drogas ilegales o de la calle son muy perjudiciales para el beb. Estn absolutamente  prohibidas. Un beb que nace de una madre adicta, ser adicto al nacer. Ese beb tendr los mismos sntomas de abstinencia que un adulto. SOLICITE ATENCIN MDICA SI:  Tiene preguntas o preocupaciones relacionadas con el embarazo. Es mejor que llame para formular las preguntas si no puede esperar hasta la prxima visita, que sentirse preocupada por ellas.  DECISIONES ACERCA DE LA CIRCUNCISIN  Usted puede saber o no cul es el sexo de su beb. Si ya sabe que ser un varn, este es el momento de pensar acerca de la circuncisin. La circuncisin es la extirpacin del prepucio. Esta es la piel que cubre el extremo sensible del pene. No hay un motivo mdico que lo justifique. Generalmente la decisin se toma segn lo que sea popular en ese momento, o segn creencias religiosas. Podr conversar estos temas con su mdico o con el pediatra.  SOLICITE ATENCIN MDICA DE INMEDIATO SI:   La temperatura oral le sube a ms de 102 F (38.9 C) o lo que su mdico le   indique.  Tiene una prdida de lquido por la vagina (canal de parto). Si sospecha una ruptura de las membranas, tmese la temperatura y llame al profesional para informarlo sobre esto.  Observa unas pequeas manchas, una hemorragia vaginal o elimina cogulos. Notifique al profesional acerca de la cantidad y de cuntos apsitos est utilizando.  Presenta un olor desagradable en la secrecin vaginal y observa un cambio en el color, de transparente a blanco.  Ha vomitado durante ms de 24 horas.  Siente escalofros o le sube la fiebre.  Le falta el aire.  Siente ardor al orinar.  Baja o sube ms de 2 libras (900 g), o segn lo indicado por el profesional que la asiste.  Observa que sbitamente se le hinchan el rostro, las manos, los pies o las piernas.  Siente dolor en el vientre (abdominal). Las molestias en el ligamento redondo son una causa benigna frecuente de dolor abdominal durante el embarazo. El profesional que la asiste deber  evaluarla.  Presenta dolor de cabeza intenso que no se alivia.  Tiene problemas visuales, visin doble o borrosa.  Si no siente los movimientos del beb durante ms de 1 hora. Si piensa que el beb no se mueve tanto como lo haca habitualmente, coma algo que contenga azcar y recustese sobre el lado izquierdo durante una hora. El beb debe moverse al menos 4  5 veces por hora. Comunquese inmediatamente si el beb se mueve menos que lo indicado.  Se cae, se ve involucrada en un accidente automovilstico o sufre algn tipo de traumatismo.  En su hogar hay violencia mental o fsica. Document Released: 10/19/2004 Document Revised: 07/11/2011 [employer] Patient Information 2013 [employer].  

## 2012-04-10 NOTE — Progress Notes (Signed)
Doing well. No contractions or bleeding. Plans 4th VBAC.

## 2012-04-10 NOTE — Progress Notes (Signed)
Doing well. Does complain of a cracking feeling in throat when singing loudly, less when softer. Does not actually hurt but will make her cough at times.  Denies cough, throat pain or congestion.  Declines referral to ENT.  Suggest Tylenol for pain.

## 2012-04-24 ENCOUNTER — Ambulatory Visit (INDEPENDENT_AMBULATORY_CARE_PROVIDER_SITE_OTHER): Payer: Self-pay | Admitting: Advanced Practice Midwife

## 2012-04-24 VITALS — BP 109/64 | Temp 98.8°F | Wt 167.0 lb

## 2012-04-24 DIAGNOSIS — O0930 Supervision of pregnancy with insufficient antenatal care, unspecified trimester: Secondary | ICD-10-CM

## 2012-04-24 DIAGNOSIS — O0933 Supervision of pregnancy with insufficient antenatal care, third trimester: Secondary | ICD-10-CM

## 2012-04-24 LAB — POCT URINALYSIS DIP (DEVICE)
Glucose, UA: NEGATIVE mg/dL
Ketones, ur: NEGATIVE mg/dL
Leukocytes, UA: NEGATIVE
Protein, ur: NEGATIVE mg/dL
Specific Gravity, Urine: 1.02 (ref 1.005–1.030)

## 2012-04-24 NOTE — Progress Notes (Signed)
Seen also by me, agree with note

## 2012-04-24 NOTE — Progress Notes (Signed)
Doing well. No contractions, bleeding, or discharge. Is taking PNV. Feeling some pressure.

## 2012-04-24 NOTE — Patient Instructions (Addendum)
Ihor Dow y parto normal (Normal Labor and Delivery) Licensed conveyancer, su mdico debe estar seguro de que usted est en trabajo de [place]. Algunos signos son:  Puede haber eliminado el "tapn mucoso" antes que comience el trabajo de [place]. Se trata de una pequea cantidad de mucus con sangre.  Tiene contracciones uterinas regulares.  El [employer] las contracciones se acorta.  Las molestias y Chief Technology Officer se hacen gradualmente ms intensos.  El dolor se ubica principalmente en la espalda.  Los dolores empeoran al [employer].  El cuello del tero (la apertura del tero se hace ms delgada, comienza a borrarse, y se abre (se dilata). Una vez que se encuentre en [place] parto y sea admitida en el hospital, el mdico har lo siguiente:  Un examen fsico completo.  Controlar sus signos vitales (presin arterial, pulso, temperatura y la frecuencia cardaca fetal).  Realizar un examen vaginal (usando un guante estril y lubricante para determinar:  La posicin (presentacin) del beb (ceflica [vertex] o nalgas primero).  El nivel (plano) de la cabeza del beb en el canal de parto.  El borramiento y dilatacin del cuello del tero.  Le rasurarn el vello pbico y le aplicarn una enema segn lo considere el mdico y las circunstancias.  Generalmente se coloca un monitor electrnico sobre el abdomen. El monitor sigue la duracin e intensidad de las contracciones, as como la frecuencia cardaca del beb.  Generalmente, el profesional inserta una va intravenosa en el brazo para administrarle agua azucarada. Esta es una medida de precaucin, de modo que puedan administrarle rpidamente medicamentos durante el [place]. EL TRABAJO DE PARTO Y PARTO NORMALES SE DIVIDEN EN 3 ETAPAS: Primera etapa Comienzan las contracciones regulares y el cuello comienza a borrarse y dilatarse. Esta etapa puede durar entre 3 y 15 horas. El final de la primera etapa se considera cuando el cuello  est borrado en un 100% y se ha dilatado 10 cm. Le administrarn analgsicos por:  Inyeccin (morfina, demerol, etc.).  Anestesia regional (espinal, caudal o epidural, anestsicos colocados en diferentes regiones de la columna vertebral). Podrn administrarle medicamentos para el dolor en la regin paracervical, que consiste en la aplicacin de un anestsico inyectable en cada uno de los lados del cuello del tero. La embarazada puede requerir un "parto natural" , es decir no recibir [employer] o anestesia durante el [place] de parto y [place]. Segunda etapa En este momento el beb baja a travs del canal de parto (vagina) y nace. Esto puede durar entre 1 y 4 horas. A medida que el beb asoma la cabeza por el canal de parto, podr sentir una sensacin similar a cuando mueve el intestino. Sentir el impulse de empujar con fuerza hasta que el nio salga. A medida que la cabecita baja, el mdico decidir si realiza una episiotoma (corte en el perineo y rea de la vagina) para evitar la ruptura de los tejidos). Luego del nacimiento del beb y la expulsin de la placenta, la episiotoma se sutura. En algunos casos se coloca a la madre una mscara con xido nitroso para Research officer, political party respiracin y Engineer, materials. El final de la etapa 2 se produce cuando el beb ha salido completamente. Luego, cuando el cordn umbilical deja de pulsar, se pinza y se corta. Tercera etapa La tercera etapa comienza luego que el beb ha nacido y finaliza luego de la expulsin de la placenta. Generalmente esto lleva entre 5 y 30 minutos. Luego de la expulsin de la  placenta, le aplicarn un medicamento por va intravenosa para ayudar a Engineer, materials y prevenir hemorragias. En la tercera etapa no hay dolor y generalmente no son necesarios los analgsicos. Si le han realizado una episiotoma, es el momento de Sales promotion account executive. Luego del parto, la mam es observada y controlada exhaustivamente durante 1  2 horas para verificar que no  hay sangrado en el post parto (hemorragias). Si pierde [employer], le administrarn un medicamento para Engineer, manufacturing tero y Comptroller. Document Released: 12/23/2007 Document Revised: 04/03/2011 [facility] Patient Information 2013 [place], [place].

## 2012-04-24 NOTE — Progress Notes (Signed)
Pulse 94. Edema +1 on feet.  No c/o pain; pressure in pelvic.

## 2012-05-15 ENCOUNTER — Other Ambulatory Visit: Payer: Self-pay | Admitting: Obstetrics and Gynecology

## 2012-05-15 ENCOUNTER — Ambulatory Visit (INDEPENDENT_AMBULATORY_CARE_PROVIDER_SITE_OTHER): Payer: Self-pay | Admitting: Obstetrics and Gynecology

## 2012-05-15 VITALS — BP 118/69 | Temp 98.2°F | Wt 167.0 lb

## 2012-05-15 DIAGNOSIS — L309 Dermatitis, unspecified: Secondary | ICD-10-CM

## 2012-05-15 DIAGNOSIS — L259 Unspecified contact dermatitis, unspecified cause: Secondary | ICD-10-CM

## 2012-05-15 DIAGNOSIS — Z3480 Encounter for supervision of other normal pregnancy, unspecified trimester: Secondary | ICD-10-CM

## 2012-05-15 DIAGNOSIS — Z3483 Encounter for supervision of other normal pregnancy, third trimester: Secondary | ICD-10-CM

## 2012-05-15 DIAGNOSIS — R21 Rash and other nonspecific skin eruption: Secondary | ICD-10-CM

## 2012-05-15 LAB — POCT URINALYSIS DIP (DEVICE)
Bilirubin Urine: NEGATIVE
Bilirubin Urine: NEGATIVE
Glucose, UA: NEGATIVE mg/dL
Glucose, UA: NEGATIVE mg/dL
Hgb urine dipstick: NEGATIVE
Ketones, ur: NEGATIVE mg/dL
Nitrite: NEGATIVE
Nitrite: NEGATIVE
Specific Gravity, Urine: 1.02 (ref 1.005–1.030)
Urobilinogen, UA: 0.2 mg/dL (ref 0.0–1.0)
pH: 7 (ref 5.0–8.0)

## 2012-05-15 LAB — OB RESULTS CONSOLE GBS: GBS: NEGATIVE

## 2012-05-15 MED ORDER — TRIAMCINOLONE ACETONIDE 0.025 % EX OINT: TOPICAL_OINTMENT | Freq: Two times a day (BID) | CUTANEOUS | Status: DC

## 2012-05-15 NOTE — Progress Notes (Signed)
GBS done. Plans breast, vasectomy.

## 2012-05-15 NOTE — Addendum Note (Signed)
Addended by: Danae Orleans on: 05/15/2012 02:40 PM   Modules accepted: Orders

## 2012-05-15 NOTE — Addendum Note (Signed)
Addended by: Franchot Mimes on: 05/15/2012 02:36 PM   Modules accepted: Orders

## 2012-05-15 NOTE — Progress Notes (Signed)
Pulse: 102  Edema trace in feet.  No pain; c/o pressure in vaginal area.

## 2012-05-15 NOTE — Patient Instructions (Signed)
Embarazo  Systems analyst trimestre  (Pregnancy - Third Trimester) El tercer trimestre del Psychiatrist (los ltimos 3 meses) es el perodo en el cual tanto usted como su beb crecen con ms rapidez. El beb alcanza un largo de aproximadamente 50 cm. y pesa entre 2,700 y 4,500 kg. El beb gana ms tejido graso y est listo para la vida fuera del cuerpo de la [place]. Mientras estn en el interior, los bebs tienen perodos de sueo y vigilia, Warehouse manager y tienen hipo. Quizs sienta pequeas contracciones del tero. Este es el falso trabajo de [place]. Tambin se las conoce como contracciones de [place] . Es como una prctica del parto. Los problemas ms habituales de esta etapa del embarazo incluyen mayor dificultad para respirar, hinchazn de las manos y los pies por retencin de lquidos y la necesidad de Geographical information systems officer con ms frecuencia debido a que el tero y el beb presionan sobre la vejiga.  EXAMENES PRENATALES   Durante los [employer], deber seguir realizndose anlisis de [place]. Estas pruebas se realizan para controlar su salud y la del beb. Los [employer] de sangre se Radiographer, therapeutic para [employer] niveles de algunos compuestos de la sangre (hemoglobina). La anemia (bajo nivel de hemoglobina) es frecuente durante el embarazo. Para prevenirla, se administran hierro y vitaminas. Tambin le tomarn nuevas anlisis para descartar diabetes. Podrn repetirle algunas de las [employer] hicieron previamente.  En cada visita le medirn el tamao del tero. Esto permite asegurar que el beb se desarrolla adecuadamente, segn la fecha del embarazo.  Le controlarn la presin arterial en cada visita prenatal. Esto es para asegurarse de que no sufre toxemia.  Le harn un anlisis de orina en cada visita prenatal, para descartar infecciones, diabetes y la presencia de protenas.  Tambin en cada visita controlarn su peso. Esto se realiza para asegurarse que aumenta de peso al ritmo indicado y que usted y su  beb evolucionan normalmente.  En algunas ocasiones se realiza una prueba de ultrasonido para confirmar el correcto desarrollo y evolucin del beb. Esta prueba se realiza con ondas sonoras inofensivas para el beb, de modo que el profesional pueda calcular ms precisamente la fecha del [place].  Analice con su mdico los analgsicos y la anestesia que recibir durante el [place] de parto y [place].  Comente la posibilidad de que necesite una cesrea y qu anestesia se recibir.  Informe a su mdico si sufre violencia familiar mental o fsica. A veces, se indica la prueba especializada sin estrs, la prueba de tolerancia a las contracciones y el perfil biofsico para asegurarse de que el beb no tiene problemas. El estudio del lquido amnitico que rodea al beb se llama amniocentesis. El lquido amnitico se obtiene introduciendo una aguja en el vientre (abdomen ). En ocasiones se lleva a cabo cerca del final del embarazo, si es necesario inducir a un parto. En este caso se realiza para asegurarse que los pulmones del beb estn lo suficientemente maduros como para que pueda vivir fuera del tero. Si los pulmones no han madurado y es peligroso que el beb nazca, se Building services engineer a la madre una inyeccin de [place] , 1 a 2 [address]  [address] antes del [address]. Vivia Budge ayuda a que los pulmones del beb maduren y sea ms seguro su nacimiento.  CAMBIOS QUE OCURREN EN EL TERCER TRIMESTRE DEL EMBARAZO  Su organismo atravesar numerosos cambios durante el [place]. Estos pueden variar de Neomia Dear persona a otra. Converse con el profesional que la asiste acerca los cambios que  usted note y que la preocupen.   Durante el ltimo trimestre probablemente sienta un aumento del apetito. Es normal tener "antojos" de Development worker, community. Esto vara de Neomia Dear persona a otra y de un embarazo a Therapist, art.  Podrn aparecer las primeras estras en las caderas, abdomen y [place]. Estos son cambios normales del cuerpo durante el [place]. No existen  medicamentos ni ejercicios que puedan prevenir [employer].  La constipacin puede tratarse con un laxante o agregando fibra a su dieta. Beber grandes cantidades de lquidos, tomar fibras en forma de vegetales, frutas y granos integrales es de gran [place].  Tambin es beneficioso practicar actividad fsica. Si ha sido una persona Engineer, mining, podr continuar con la [employer] de las actividades durante el mismo. Si ha sido [employer], puede ser beneficioso que comience con un programa de ejercicios, Museum/gallery exhibitions officer. Consulte con el profesional que la asiste antes de comenzar un programa de ejercicios.  Evite el consumo de cigarrillos, el alcohol, los medicamentos no recetados y las "drogas de la calle" durante el Psychiatrist. Estas sustancias qumicas afectan la formacin y el desarrollo del beb. Evite estas sustancias durante todo el embarazo para asegurar el nacimiento de un beb sano.  Podr sentir dolor de espalda, tener vrices en las venas y hemorroides, o si ya los sufra, pueden [place].  Durante el tercer trimestre se cansar con ms facilidad, lo cual es normal.  Los movimientos del beb pueden ser ms fuertes y con ms frecuencia.  Puede que note dificultades para respirar normalmente.  El ombligo puede salir hacia afuera.  A veces sale Veterinary surgeon de las [place], que se llama Product manager.  Podr aparecer Neomia Dear secrecin mucosa con sangre. Esto suele ocurrir [employer] unos [address] y Neomia Dear semana antes del [place]. INSTRUCCIONES PARA EL CUIDADO EN EL HOGAR   Cumpla con las citas de control. Siga las indicaciones del mdico con respecto al uso de [place], los ejercicios y la dieta.  Durante el embarazo debe obtener nutrientes para usted y para su beb. Consuma alimentos balanceados a intervalos regulares. Elija alimentos como carne, pescado, [place] y otros productos lcteos descremados, vegetales, frutas, panes integrales y cereales. El [employer] informar  cul es el aumento de peso ideal.  Las relaciones sexuales pueden continuarse hasta casi el final del embarazo, si no se presentan otros problemas como prdida prematura (antes de [place]) de lquido amnitico, hemorragia vaginal o dolor en el vientre (abdominal).  Realice [employer], si no tiene restricciones. Consulte con el profesional que la asiste si no sabe con certeza si determinados ejercicios son seguros. El mayor aumento de peso se producir en los ltimos 2 trimestres del Psychiatrist. El ejercicio ayuda a:  Engineering geologist.  Mantenerse en forma para el trabajo de parto y [place] .  Perder peso despus del parto.  Haga reposo con frecuencia, con las piernas elevadas, o segn lo necesite para evitar los calambres y el dolor de cintura.  Use un buen sostn o como los que se usan para hacer deportes para Paramedic la sensibilidad de las [place]. Tambin puede serle til si lo [place] mientras duerme. Si pierde Product manager, podr [employer].  No utilice la baera con agua caliente, baos turcos y saunas.  Colquese el cinturn de seguridad cuando conduzca. Este la proteger a usted y al beb en caso de accidente.  Evite comer carne cruda y el contacto con los utensilios y desperdicios de los gatos. Estos elementos  contienen grmenes que pueden causar defectos de nacimiento en el beb.  Es fcil perder algo de orina durante el [place]. Apretar y Chief Operating Officer los msculos de la pelvis la ayudar con este problema. Practique detener la miccin cuando est en el bao. Estos son los mismos msculos que Development worker, international aid. Son [employer] mismos msculos que utiliza cuando trata de evitar despedir gases. Puede practicar apretando estos msculos [employer], y repetir esto tres veces por da aproximadamente. Una vez que conozca qu msculos debe apretar, no realice estos ejercicios durante la miccin. Puede favorecerle una infeccin si la orina vuelve hacia  atrs.  Pida ayuda si tienen necesidades financieras, teraputicas o nutricionales. El profesional podr ayudarla con respecto a estas necesidades, o derivarla a otros especialistas.  Haga una lista de nmeros telefnicos de emergencia y tngalos disponibles.  Planifique como obtener ayuda de familiares o amigos cuando regrese a Programmer, applications hospital.  Hacer un ensayo sobre la partida al hospital.  [place] clases prenatales con el padre para entender, practicar y hacer preguntas sobre el [place] de parto y el alumbramiento.  Preparar la habitacin del beb / busque Fatima Blank.  No viaje fuera de la ciudad a menos que sea absolutamente necesario y con el asesoramiento de su mdico.  Use slo zapatos de tacn bajo o sin tacn para tener mejor equilibrio y Automotive engineer cadas. USO DE MEDICAMENTOS Y CONSUMO DE DROGAS DURANTE EL [facility]   Tome las vitaminas apropiadas para esta etapa tal como se le indic. Las vitaminas deben contener un miligramo de cido flico. Guarde todas las vitaminas fuera del alcance de los nios. La ingestin de slo un par de vitaminas o tabletas que contengan hierro pueden ocasionar la [employer] en un beb o en un nio pequeo.  Evite el uso de [employer], incluyendo hierbas, medicamentos de [place], sin receta o que no hayan sido sugeridos por su mdico. Slo tome medicamentos de venta libre o medicamentos recetados para Chief Technology Officer, Environmental health practitioner o fiebre como lo indique su mdico. No tome aspirina, ibuprofeno (Motrin, Advil, Nuprin) o naproxeno (Aleve) excepto que su mdico se lo indique.  Infrmele al profesional si consume alguna droga.  El alcohol se relaciona con ciertos defectos congnitos. Incluye el sndrome de alcoholismo fetal. Debe evitar absolutamente el consumo de alcohol, en cualquier forma. El fumar produce baja tasa de natalidad y bebs prematuros.  Las drogas ilegales o de la calle son muy perjudiciales para el beb. Estn absolutamente  prohibidas. Un beb que nace de [employer], ser adicto al nacer. Ese beb tendr los mismos sntomas de abstinencia que un adulto. SOLICITE ATENCIN MDICA SI:  Tiene preguntas o preocupaciones relacionadas con el embarazo. Es mejor que llame para formular las preguntas si no puede esperar hasta la prxima visita, que sentirse preocupada por ellas.  DECISIONES ACERCA DE LA CIRCUNCISIN  Usted puede saber o no cul es el sexo de su beb. Si ya sabe que ser un varn, este es el momento de pensar acerca de la circuncisin. La circuncisin es la extirpacin del prepucio. Esta es la piel que cubre el extremo sensible del pene. No hay un motivo mdico que lo justifique. Generalmente la decisin se toma segn lo que sea popular en ese momento, o segn creencias religiosas. Podr conversar estos temas con su mdico o con el pediatra.  SOLICITE ATENCIN MDICA DE INMEDIATO SI:   La temperatura oral le sube a ms de 102 F (38.9 C) o lo que su mdico le  indique.  Tiene una prdida de lquido por la vagina (canal de parto). Si sospecha una ruptura de las [place], tmese la temperatura y llame al profesional para informarlo sobre esto.  Observa unas pequeas manchas, una hemorragia vaginal o elimina cogulos. Notifique al profesional acerca de la cantidad y de cuntos apsitos est utilizando.  Presenta un olor desagradable en la secrecin vaginal y observa un cambio en el color, de transparente a blanco.  Ha vomitado durante ms de 24 horas.  Siente escalofros o le sube la fiebre.  Le falta el aire.  Siente ardor al Beatrix Shipper.  [place]aja o sube ms de 2 libras (900 g), o segn lo indicado por el profesional que la asiste.  Observa que sbitamente se le hinchan el rostro, las manos, los pies o las piernas.  Siente dolor en el vientre (abdominal). Las [employer] en el ligamento redondo son Neomia Dear causa benigna frecuente de dolor abdominal durante el embarazo. El profesional que la asiste deber  evaluarla.  Presenta dolor de cabeza intenso que no se [place].  Tiene problemas visuales, visin doble o borrosa.  Si no siente los movimientos del beb durante ms de 1 hora. Si piensa que el beb no se mueve tanto como lo haca habitualmente, coma algo que Psychologist, clinical y [employer] lado izquierdo durante [place]. El beb debe moverse al menos 4  5 veces por hora. Comunquese inmediatamente si el beb se mueve menos que lo indicado.  Se cae, se ve involucrada en un accidente automovilstico o sufre algn tipo de traumatismo.  En su hogar hay violencia mental o fsica. Document Released: 10/19/2004 Document Revised: 07/11/2011 [facility] Patient Information 2013 [place], [place].

## 2012-05-15 NOTE — Progress Notes (Signed)
Left areolar crusting pruritic rash> rx triamcinolone

## 2012-05-29 ENCOUNTER — Ambulatory Visit (INDEPENDENT_AMBULATORY_CARE_PROVIDER_SITE_OTHER): Payer: Self-pay | Admitting: Obstetrics and Gynecology

## 2012-05-29 ENCOUNTER — Other Ambulatory Visit: Payer: Self-pay | Admitting: Obstetrics and Gynecology

## 2012-05-29 VITALS — BP 111/64 | Temp 97.1°F | Wt 170.4 lb

## 2012-05-29 DIAGNOSIS — O34219 Maternal care for unspecified type scar from previous cesarean delivery: Secondary | ICD-10-CM

## 2012-05-29 LAB — POCT URINALYSIS DIP (DEVICE)
Bilirubin Urine: NEGATIVE
Glucose, UA: NEGATIVE mg/dL
Ketones, ur: NEGATIVE mg/dL
Leukocytes, UA: NEGATIVE
Nitrite: NEGATIVE

## 2012-05-29 NOTE — Progress Notes (Signed)
Irregular UCs. Good FM. S/Sx labor and plans reviewed.

## 2012-05-29 NOTE — Progress Notes (Signed)
Pulse- 97  Edema-feet  Pain-pelvic pain

## 2012-06-04 ENCOUNTER — Encounter (HOSPITAL_COMMUNITY): Payer: Self-pay | Admitting: *Deleted

## 2012-06-04 ENCOUNTER — Inpatient Hospital Stay (HOSPITAL_COMMUNITY)
Admission: AD | Admit: 2012-06-04 | Discharge: 2012-06-04 | Disposition: A | Discharge status: Home / Self Care | Payer: Medicaid Other | Source: Ambulatory Visit | Attending: Obstetrics & Gynecology | Admitting: Obstetrics & Gynecology

## 2012-06-04 DIAGNOSIS — R197 Diarrhea, unspecified: Secondary | ICD-10-CM | POA: Insufficient documentation

## 2012-06-04 DIAGNOSIS — O479 False labor, unspecified: Secondary | ICD-10-CM | POA: Insufficient documentation

## 2012-06-04 DIAGNOSIS — R509 Fever, unspecified: Secondary | ICD-10-CM | POA: Insufficient documentation

## 2012-06-04 HISTORY — DX: Other specified health status: Z78.9

## 2012-06-04 LAB — URINALYSIS, ROUTINE W REFLEX MICROSCOPIC
Leukocytes, UA: NEGATIVE
Nitrite: NEGATIVE
Specific Gravity, Urine: <1.005 — ABNORMAL LOW (ref 1.005–1.030)
Urobilinogen, UA: 0.2 mg/dL (ref 0.0–1.0)
pH: 6 (ref 5.0–8.0)

## 2012-06-04 NOTE — Progress Notes (Signed)
Pt states her bowel movements have been watery x 8 today

## 2012-06-04 NOTE — Progress Notes (Signed)
Pt states she took medication for depression when she was pregnant with her child that is 26yr old now. Pt not taking medication at this time

## 2012-06-04 NOTE — MAU Note (Signed)
Pt states she has had a fever and contractions since 1400. Pt states her contractions are about q 11 mins

## 2012-06-04 NOTE — MAU Note (Signed)
Pt reports contractions and fever. Denies bleeding or ROM

## 2012-06-04 NOTE — Discharge Instructions (Signed)
Embarazo  Systems analyst trimestre  (Pregnancy - Third Trimester) El tercer trimestre del Psychiatrist (los ltimos 3 meses) es el perodo en el cual tanto usted como su beb crecen con ms rapidez. El beb alcanza un largo de aproximadamente 50 cm. y pesa entre 2,700 y 4,500 kg. El beb gana ms tejido graso y est listo para la vida fuera del cuerpo de la [place]. Mientras estn en el interior, los bebs tienen perodos de sueo y vigilia, Warehouse manager y tienen hipo. Quizs sienta pequeas contracciones del tero. Este es el falso trabajo de [place]. Tambin se las conoce como contracciones de [place] . Es como una prctica del parto. Los problemas ms habituales de esta etapa del embarazo incluyen mayor dificultad para respirar, hinchazn de las manos y los pies por retencin de lquidos y la necesidad de Geographical information systems officer con ms frecuencia debido a que el tero y el beb presionan sobre la vejiga.  EXAMENES PRENATALES   Durante los Manpower Inc, deber seguir realizndose anlisis de [place]. Estas pruebas se realizan para controlar su salud y la del beb. Los ARAMARK Corporation de sangre se Radiographer, therapeutic para [employer] niveles de algunos compuestos de la sangre (hemoglobina). La anemia (bajo nivel de hemoglobina) es frecuente durante el embarazo. Para prevenirla, se administran hierro y vitaminas. Tambin le tomarn nuevas anlisis para descartar diabetes. Podrn repetirle algunas de las Hovnanian Enterprises hicieron previamente.  En cada visita le medirn el tamao del tero. Esto permite asegurar que el beb se desarrolla adecuadamente, segn la fecha del embarazo.  Le controlarn la presin arterial en cada visita prenatal. Esto es para asegurarse de que no sufre toxemia.  Le harn un anlisis de orina en cada visita prenatal, para descartar infecciones, diabetes y la presencia de protenas.  Tambin en cada visita controlarn su peso. Esto se realiza para asegurarse que aumenta de peso al ritmo indicado y que usted y su  beb evolucionan normalmente.  En algunas ocasiones se realiza una prueba de ultrasonido para confirmar el correcto desarrollo y evolucin del beb. Esta prueba se realiza con ondas sonoras inofensivas para el beb, de modo que el profesional pueda calcular ms precisamente la fecha del [place].  Analice con su mdico los analgsicos y la anestesia que recibir durante el [place] de parto y [place].  Comente la posibilidad de que necesite una cesrea y qu anestesia se recibir.  Informe a su mdico si sufre violencia familiar mental o fsica. A veces, se indica la prueba especializada sin estrs, la prueba de tolerancia a las contracciones y el perfil biofsico para asegurarse de que el beb no tiene problemas. El estudio del lquido amnitico que rodea al beb se llama amniocentesis. El lquido amnitico se obtiene introduciendo una aguja en el vientre (abdomen ). En ocasiones se lleva a cabo cerca del final del embarazo, si es necesario inducir a un parto. En este caso se realiza para asegurarse que los pulmones del beb estn lo suficientemente maduros como para que pueda vivir fuera del tero. Si los pulmones no han madurado y es peligroso que el beb nazca, se Building services engineer a la madre una inyeccin de [place] , 1 a 2 [address]  [address] antes del [address]. Vivia Budge ayuda a que los pulmones del beb maduren y sea ms seguro su nacimiento.  CAMBIOS QUE OCURREN EN EL TERCER TRIMESTRE DEL EMBARAZO  Su organismo atravesar numerosos cambios durante el [place]. Estos pueden variar de Neomia Dear persona a otra. Converse con el profesional que la asiste acerca los cambios que  usted note y que la preocupen.   Durante el ltimo trimestre probablemente sienta un aumento del apetito. Es normal tener "antojos" de Development worker, community. Esto vara de Neomia Dear persona a otra y de un embarazo a Therapist, art.  Podrn aparecer las primeras estras en las caderas, abdomen y [place]. Estos son cambios normales del cuerpo durante el [place]. No existen  medicamentos ni ejercicios que puedan prevenir [employer].  La constipacin puede tratarse con un laxante o agregando fibra a su dieta. Beber grandes cantidades de lquidos, tomar fibras en forma de vegetales, frutas y granos integrales es de gran [place].  Tambin es beneficioso practicar actividad fsica. Si ha sido una persona Engineer, mining, podr continuar con la Harley[employer]son de las actividades durante el mismo. Si ha sido [employer], puede ser beneficioso que comience con un programa de ejercicios, Museum/gallery exhibitions officer. Consulte con el profesional que la asiste antes de comenzar un programa de ejercicios.  Evite el consumo de cigarrillos, el alcohol, los medicamentos no recetados y las "drogas de la calle" durante el Psychiatrist. Estas sustancias qumicas afectan la formacin y el desarrollo del beb. Evite estas sustancias durante todo el embarazo para asegurar el nacimiento de un beb sano.  Podr sentir dolor de espalda, tener vrices en las venas y hemorroides, o si ya los sufra, pueden [place].  Durante el tercer trimestre se cansar con ms facilidad, lo cual es normal.  Los movimientos del beb pueden ser ms fuertes y con ms frecuencia.  Puede que note dificultades para respirar normalmente.  El ombligo puede salir hacia afuera.  A veces sale Veterinary surgeon de las [place], que se llama Product manager.  Podr aparecer Neomia Dear secrecin mucosa con sangre. Esto suele ocurrir General Electric unos [address] y Neomia Dear semana antes del [place]. INSTRUCCIONES PARA EL CUIDADO EN EL HOGAR   Cumpla con las citas de control. Siga las indicaciones del mdico con respecto al uso de [place], los ejercicios y la dieta.  Durante el embarazo debe obtener nutrientes para usted y para su beb. Consuma alimentos balanceados a intervalos regulares. Elija alimentos como carne, pescado, [place] y otros productos lcteos descremados, vegetales, frutas, panes integrales y cereales. El [employer]ot informar  cul es el aumento de peso ideal.  Las relaciones sexuales pueden continuarse hasta casi el final del embarazo, si no se presentan otros problemas como prdida prematura (antes de [place]) de lquido amnitico, hemorragia vaginal o dolor en el vientre (abdominal).  Realice Tesoro Corporation, si no tiene restricciones. Consulte con el profesional que la asiste si no sabe con certeza si determinados ejercicios son seguros. El mayor aumento de peso se producir en los ltimos 2 trimestres del Psychiatrist. El ejercicio ayuda a:  Engineering geologist.  Mantenerse en forma para el trabajo de parto y [place] .  Perder peso despus del parto.  Haga reposo con frecuencia, con las piernas elevadas, o segn lo necesite para evitar los calambres y el dolor de cintura.  Use un buen sostn o como los que se usan para hacer deportes para Paramedic la sensibilidad de las [place]. Tambin puede serle til si lo [place] mientras duerme. Si pierde Product manager, podr [employer]in.  No utilice la baera con agua caliente, baos turcos y saunas.  Colquese el cinturn de seguridad cuando conduzca. Este la proteger a usted y al beb en caso de accidente.  Evite comer carne cruda y el contacto con los utensilios y desperdicios de los gatos. Estos elementos  contienen grmenes que pueden causar defectos de nacimiento en el beb.  Es fcil perder algo de orina durante el [place]. Apretar y Chief Operating Officer los msculos de la pelvis la ayudar con este problema. Practique detener la miccin cuando est en el bao. Estos son los mismos msculos que Development worker, international aid. Son [employer]PP[employer] Partners mismos msculos que utiliza cuando trata de evitar despedir gases. Puede practicar apretando estos msculos WellPoint, y repetir esto tres veces por da aproximadamente. Una vez que conozca qu msculos debe apretar, no realice estos ejercicios durante la miccin. Puede favorecerle una infeccin si la orina vuelve hacia  atrs.  Pida ayuda si tienen necesidades financieras, teraputicas o nutricionales. El profesional podr ayudarla con respecto a estas necesidades, o derivarla a otros especialistas.  Haga una lista de nmeros telefnicos de emergencia y tngalos disponibles.  Planifique como obtener ayuda de familiares o amigos cuando regrese a Programmer, applications hospital.  Hacer un ensayo sobre la partida al hospital.  [place] clases prenatales con el padre para entender, practicar y hacer preguntas sobre el [place] de parto y el alumbramiento.  Preparar la habitacin del beb / busque Fatima Blank.  No viaje fuera de la ciudad a menos que sea absolutamente necesario y con el asesoramiento de su mdico.  Use slo zapatos de tacn bajo o sin tacn para tener mejor equilibrio y Automotive engineer cadas. USO DE MEDICAMENTOS Y CONSUMO DE DROGAS DURANTE EL [facility]   Tome las vitaminas apropiadas para esta etapa tal como se le indic. Las vitaminas deben contener un miligramo de cido flico. Guarde todas las vitaminas fuera del alcance de los nios. La ingestin de slo un par de vitaminas o tabletas que contengan hierro pueden ocasionar la Newmont Mining en un beb o en un nio pequeo.  Evite el uso de [employer] Mutual [employer] Omaha, incluyendo hierbas, medicamentos de [place], sin receta o que no hayan sido sugeridos por su mdico. Slo tome medicamentos de venta libre o medicamentos recetados para Chief Technology Officer, Environmental health practitioner o fiebre como lo indique su mdico. No tome aspirina, ibuprofeno (Motrin, Advil, Nuprin) o naproxeno (Aleve) excepto que su mdico se lo indique.  Infrmele al profesional si consume alguna droga.  El alcohol se relaciona con ciertos defectos congnitos. Incluye el sndrome de alcoholismo fetal. Debe evitar absolutamente el consumo de alcohol, en cualquier forma. El fumar produce baja tasa de natalidad y bebs prematuros.  Las drogas ilegales o de la calle son muy perjudiciales para el beb. Estn absolutamente  prohibidas. Un beb que nace de American Express, ser adicto al nacer. Ese beb tendr los mismos sntomas de abstinencia que un adulto. SOLICITE ATENCIN MDICA SI:  Tiene preguntas o preocupaciones relacionadas con el embarazo. Es mejor que llame para formular las preguntas si no puede esperar hasta la prxima visita, que sentirse preocupada por ellas.  DECISIONES ACERCA DE LA CIRCUNCISIN  Usted puede saber o no cul es el sexo de su beb. Si ya sabe que ser un varn, este es el momento de pensar acerca de la circuncisin. La circuncisin es la extirpacin del prepucio. Esta es la piel que cubre el extremo sensible del pene. No hay un motivo mdico que lo justifique. Generalmente la decisin se toma segn lo que sea popular en ese momento, o segn creencias religiosas. Podr conversar estos temas con su mdico o con el pediatra.  SOLICITE ATENCIN MDICA DE INMEDIATO SI:   La temperatura oral le sube a ms de 102 F (38.9 C) o lo que su mdico le  indique.  Tiene una prdida de lquido por la vagina (canal de parto). Si sospecha una ruptura de las [place], tmese la temperatura y llame al profesional para informarlo sobre esto.  Observa unas pequeas manchas, una hemorragia vaginal o elimina cogulos. Notifique al profesional acerca de la cantidad y de cuntos apsitos est utilizando.  Presenta un olor desagradable en la secrecin vaginal y observa un cambio en el color, de transparente a blanco.  Ha vomitado durante ms de 24 horas.  Siente escalofros o le sube la fiebre.  Le falta el aire.  Siente ardor al Beatrix Shipper.  Baja o sube ms de 2 libras (900 g), o segn lo indicado por el profesional que la asiste.  Observa que sbitamente se le hinchan el rostro, las manos, los pies o las piernas.  Siente dolor en el vientre (abdominal). Las [employer]ul en el ligamento redondo son Neomia Dear causa benigna frecuente de dolor abdominal durante el embarazo. El profesional que la asiste deber  evaluarla.  Presenta dolor de cabeza intenso que no se [place].  Tiene problemas visuales, visin doble o borrosa.  Si no siente los movimientos del beb durante ms de 1 hora. Si piensa que el beb no se mueve tanto como lo haca habitualmente, coma algo que Psychologist, clinical y Target Corporation lado izquierdo durante [place]. El beb debe moverse al menos 4  5 veces por hora. Comunquese inmediatamente si el beb se mueve menos que lo indicado.  Se cae, se ve involucrada en un accidente automovilstico o sufre algn tipo de traumatismo.  En su hogar hay violencia mental o fsica. Document Released: 10/19/2004 Document Revised: 07/11/2011 [facility] Patient Information 2013 [place], [place].   Dieta para la Archivist (Diet for Diarrhea, Adult) La diarrea (deposicin frecuente de heces) tiene muchas causas. Este trastorno puede originarse o empeorar por lo que usted come o bebe. La diarrea pudiera aliviarse con un cambio en la dieta. SI NO TOLERA LOS ALIMENTOS SLIDOS:  Beba lquido en abundancia. Evite las bebidas azucaradas, las gaseosas y las bebidas lcteas.  Evite las bebidas que contengan cafena y alcohol.  Puede tratar con bebidas rehidratantes. O puede preparar usted mismo la siguiente receta:   cucharadita de sal.   cucharadita de bicarbonato.   de cucharadita de sal sustituta (cloruro de potasio).  1 Cucharada + 1 cucharadita de azcar.  1 litro de agua A medida que las heces se vuelvan ms slidas, podr comenzar a ingerir alimentos slidos. Agregue un alimento por vez. Si ciertos alimentos le producen diarrea o se la empeoran, evtelos y pruebe con otros. Se recomienda una dieta baja en fibras y en grasas y sin lactosa. Las comidas frecuentes y en cantidades pequeas son mejor toleradas.  Fculas  Permitidos: Auto[employer]ers [employer], francs, pita, bollos y rosquillas. Muffins, pan cimo. Galletas de [employer], saladas o de graham. Pretzel, biscotes, bizcochos. Cereales  cocidos en agua. Harina de maz, farina, crema de cereales. Cereales secos: Maz refinado, Wonda Cheng. Patatas preparadas de cualquier modo sin piel, macaroni, espaghetti, fideos, arroz refinado.  Evite: Pan, bollos o galletas preparadas con trigo entero, multigranos, salvado, semillas, frutos secos o coco. Tortilla de maz, bases de masa. Vase ms [place]. Chizitos, nachos. Cereales que contengan granos enteros, multigranos, coco, frutos secos o pasas de uva. Harina de avena cocida o seca. Cereales de grano grueso, granola. Cereales promocionados como con "alto contenido de [place]". Cscara de patatas. Pastas de Marcy Panning, Cristino Martes. Palomitas de maz. Panecillos dulces, donas, panqueques, waffles, pan dulce.  Vegetales  Permitidos: Jugo de tomates o de vegetales. Vegetales bien cocidos o enlatados sin semillas. [place]: Deatra James, pepino sin cscara, calabaza, espinaca, brotes de soja.  Evite: [place], cocidos o enlatados: [place], porotos, remolacha, brccoli, repollitos de [place], maz, coles, legumbres, arvejas, batatas. Cocidos: Repollo verde o rojo, espinacas. Evite las porciones grandes de Education officer, environmental, debido a que los vegetales disminuyen su tamao al cocinarlos y contienen ms fibras por porcin. Nils Pyle  Permitidos: Todas las frutas excepto el jugo de ciruelas. Cocidas o enlatadas: Duraznos, pur de [place], meln, cerezas, cctel de frutas, pomelo, uvas, kiwi, naranjas, melocotn, pera, ciruelas, sandas. [place]: Manzanas sin la piel, banana madura, uvas, meln, cerezas, pomelo, duraznos, naranjas, ciruelas. Limite las porciones a  taza o 1 unidad.  Evite: [place]: Manzana con piel, damasco, mango, pera, frambuesa, frutillas. Jugo de ciruela, compota o ciruelas secas. Frutas secas, pasas de uva, dtiles. Evite porciones grandes de todas las frutas frescas. Carnes y [employer]otive  Permitidos: Carne molida o un bife tierno bien cocido, jamn, ternera, cordero, cerdo o aves.  Huevos, queso. pescado, ostra, langostinos, [place], frutos de mar. Hgado y otros rganos.  Evite: Carnes duras y fibrosas con TEFL teacher. Mantequilla de man, suave o entera. Quesos con semillas, frutos secos u otros alimentos no permitidos. Frutos secos, semillas, legumbres, arvejas secas, lentejas. Leche  Permitidos: Yogur, P[employer], kefir, yogur bebible, suero de la [place], Viola de soja.  Evite: Osvaldo Human JAARS, bebidas hechas con [place], batidos. Sopas  Permitidos: Consom, caldo o sopas hechas con los alimentos permitidos. Cualquier sopa colada.  Evite: Sopas hechas con vegetales no permitidos, sopas basadas en cremas o leche. Postres y [place]  Permitidos: Gelatina sin azcar, helados de agua sin azcar.  Evite: Tortas y masitas, pasteles hechos con frutas permitidas, budines, natillas, pasteles con crema. Gelatina, fruta, hielo, sorbetes, helados de agua. Helados, batidos sin frutos secos. Caramelos duros, miel, gelatina, melaza, jarabes, azcar, jarabe de chocolate, pastillas de goma, malvaviscos. Grasas y Aceites  Permitidos: Evite todas las grasas y [place].  Evite: Semillas, frutos secos, aceitunas, paltas. Margarina, Phillips Grout, crema, mayonesa, aceites para [place], aderezos para ensaladas hechos con alimentos permitidos. Salsas, tocino sin corteza. Bebidas  Permitidos: Agua, tes descafeinados, soluciones de rehidratacin oral, bebidas sin azcar.  Evite: Jugos de fruta, bebidas con cafena (como caf, t y bebidas cola), alcohol, bebidas deportivas o bebidas lima- limn. Condimentos  Permitidos: Ketchup, mostaza, rbano picante, vinagre, cremas, crema de queso, polvo de cacao. Especias con moderacin: [place], laurel, perejil, curry, tomillo, gengibre, mejorana, polvo de cebolla o de ajo, organo, paprika, perejil, pimienta molida, romero, salvia, ajedrea, estragn, tomillo, crcuma.  Evite: Coco, miel. Control del peso: [employer] Corporation. Controle su  peso todas las maanas despus de orinar y antes de Engineer, maintenance. Psese siempre con la misma ropa. Registre su peso diariamente. En su prxima visita traiga el registro de sus pesos. Comunquese inmediatamente con su mdico si ha aumentado 3 libras (1,4 Kg) o ms en un da o 5 libras (2,3 Kg) en una semana. SOLICITE ATENCIN MDICA DE INMEDIATO SI:  No puede retener lquidos.  Aparecen vmitos o la diarrea se hace recurrente (vuelve una y Laverda Page).  Aparece dolor en el vientre (abdominal ) que aumenta o se siente en un punto determinado (se localiza).  Usted tienen una temperatura oral de ms de 102 F (38.9 C) y no puede controlarla con medicamentos.  La diarrea se hace excesiva o contiene sangre o mucosidad.  Presenta debilidad excesiva, mareos, lipotimia o sed extrema. ASEGRESE QUE:  Comprende estas instrucciones.  Controlar su enfermedad.  Solicitar ayuda inmediatamente si no mejora o si empeora. Document Released: 01/09/2005 Document Revised: 04/03/2011 [facility] Patient Information 2013 [place], [place].

## 2012-06-04 NOTE — MAU Provider Note (Signed)
  History     CSN: [id number]  Arrival date and time: 06/04/12 2213   None     Chief Complaint  Patient presents with  . Labor Eval  . Fever   HPI  Rhonda Townsend is a 32 y.o. [id number] at [redacted]w[redacted]d who presents complaining of contractions.  The contractions started at 2pm today and were initially happening every 30 minutes. Denies fluid leaking or vaginal bleeding. Baby is moving well.   She has had watery diarrhea 8 times today. She felt nauseated this morning but did not vomit. She thought she had a fever at home (subjective, did not take temp) and so she took tylenol 325mg . She feels less feverish now.   OB History   Grav Para Term Preterm Abortions TAB SAB Ect Mult Living   5 4 3 1  0 0 0 0 0 4    c/s preterm in [place] 3 vaginal deliveries after that, no problems during preg Second vag del had PPH.  Past Medical History  Diagnosis Date  . Tachycardia 2014  . Medical history non-contributory     Past Surgical History  Procedure Laterality Date  . Cesarean section      2003 (1st preg in [place])    Family History  Problem Relation Age of Onset  . Alcohol abuse Neg Hx   . Arthritis Neg Hx   . Asthma Neg Hx   . Birth defects Neg Hx   . Cancer Neg Hx   . COPD Neg Hx   . Depression Neg Hx   . Diabetes Neg Hx   . Drug abuse Neg Hx   . Early death Neg Hx   . Hearing loss Neg Hx   . Heart disease Neg Hx   . Hypertension Neg Hx   . Hyperlipidemia Neg Hx   . Kidney disease Neg Hx   . Learning disabilities Neg Hx   . Mental illness Neg Hx   . Mental retardation Neg Hx   . Miscarriages / Stillbirths Neg Hx   . Stroke Neg Hx   . Vision loss Neg Hx     History  Substance Use Topics  . Smoking status: Never Smoker   . Smokeless tobacco: Never Used  . Alcohol Use: No    Allergies: No Known Allergies  Meds: Tylenol prn PNV  ROS Physical Exam   Blood pressure 127/67, pulse 115, temperature 100.2 F (37.9 C), temperature source Oral, resp.  rate 20, height 5' 1.5" (1.562 m), weight 174 lb (78.926 kg), last menstrual period 10/17/2011, SpO2 100.00%.  Physical Exam Gen: NAD HEENT: MMM Lungs: NWOB Abd: gravid but otherwise soft, nontender to palpation Ext: no appreciable lower extremity edema bilaterally. Brisk capillary refill. Neuro: grossly nonfocal, speech intact GU: normal appearing external genitalia Cervix 3cm, thick and high per Philipp Deputy, CNM  MAU Course  Procedures  FHR: baseline 150s, moderate variability, + accels, no decels Toco: uterine irritability present   Assessment and Plan  Rhonda Townsend is a 32 y.o. [id number] at [redacted]w[redacted]d who presents with contractions, diarrhea, and low grade fever here in the MAU.  Ctx: cervix 3cm and thick. Not in active labor at this time. Diarrhea and low grade fever: likely GI virus. Will give handout on BRAT diet. Appears well hydrated at this time. Stable for discharge.  Levert Feinstein, MD Family Medicine PGY-1  I have seen and examined this patient and I agree with the above. Cam Hai 5:32 AM 06/05/2012

## 2012-06-05 ENCOUNTER — Ambulatory Visit (INDEPENDENT_AMBULATORY_CARE_PROVIDER_SITE_OTHER): Payer: Self-pay | Admitting: Obstetrics and Gynecology

## 2012-06-05 VITALS — BP 106/64 | Temp 97.3°F | Wt 172.3 lb

## 2012-06-05 DIAGNOSIS — O34219 Maternal care for unspecified type scar from previous cesarean delivery: Secondary | ICD-10-CM

## 2012-06-05 DIAGNOSIS — Z98891 History of uterine scar from previous surgery: Secondary | ICD-10-CM

## 2012-06-05 LAB — POCT URINALYSIS DIP (DEVICE)
Bilirubin Urine: NEGATIVE
Hgb urine dipstick: NEGATIVE
Ketones, ur: 15 mg/dL — AB
Specific Gravity, Urine: 1.025 (ref 1.005–1.030)
pH: 6.5 (ref 5.0–8.0)

## 2012-06-05 NOTE — Progress Notes (Signed)
Pulse- 98 Patient reports abdominal pain from contractions that started yesterday.

## 2012-06-05 NOTE — Progress Notes (Signed)
UCs q 11 min x hrs yesterday, irregular today. No show or leaking. VE: membranes swept>slight show. Kick counts and as/sx labor. Sign TOLAC consent today.

## 2012-06-10 ENCOUNTER — Encounter: Payer: Self-pay | Admitting: *Deleted

## 2012-06-12 ENCOUNTER — Inpatient Hospital Stay (HOSPITAL_COMMUNITY)
Admission: AD | Admit: 2012-06-12 | Discharge: 2012-06-13 | Disposition: A | Discharge status: Home / Self Care | Payer: Medicaid Other | Source: Ambulatory Visit | Attending: Obstetrics and Gynecology | Admitting: Obstetrics and Gynecology

## 2012-06-12 ENCOUNTER — Ambulatory Visit (INDEPENDENT_AMBULATORY_CARE_PROVIDER_SITE_OTHER): Payer: Self-pay | Admitting: Family Medicine

## 2012-06-12 ENCOUNTER — Encounter (HOSPITAL_COMMUNITY): Payer: Self-pay

## 2012-06-12 VITALS — BP 101/64 | Temp 98.2°F | Wt 172.3 lb

## 2012-06-12 DIAGNOSIS — O479 False labor, unspecified: Secondary | ICD-10-CM | POA: Insufficient documentation

## 2012-06-12 DIAGNOSIS — O34219 Maternal care for unspecified type scar from previous cesarean delivery: Secondary | ICD-10-CM

## 2012-06-12 DIAGNOSIS — Z3480 Encounter for supervision of other normal pregnancy, unspecified trimester: Secondary | ICD-10-CM | POA: Insufficient documentation

## 2012-06-12 DIAGNOSIS — Z3483 Encounter for supervision of other normal pregnancy, third trimester: Secondary | ICD-10-CM

## 2012-06-12 LAB — POCT URINALYSIS DIP (DEVICE)
Hgb urine dipstick: NEGATIVE
Ketones, ur: NEGATIVE mg/dL
Protein, ur: NEGATIVE mg/dL
Specific Gravity, Urine: 1.02 (ref 1.005–1.030)
pH: 7 (ref 5.0–8.0)

## 2012-06-12 NOTE — MAU Note (Signed)
Per interpreter, pt reports a lot of contractions and some blood on tissue when she wipes.

## 2012-06-12 NOTE — Progress Notes (Signed)
Pulse: 88 Needs GC/Chlamydia

## 2012-06-12 NOTE — Patient Instructions (Addendum)
Induccin del [place] de parto  (Labor Induction) La mayora de las mujeres comienza el trabajo de parto sin [place] las 37 y 42 semanas del Psychiatrist. Cuando esto no sucede, o cuando hay una necesidad mdica, pueden utilizarse medicamentos u otros mtodos para inducirlo. La induccin al parto hace que el tero de la mujer embarazada se [place]. Tambin hace que el cuello uterino se ab[place]e (madure), se abra (se dilate), y se afine (se borre). Por lo general, el trabajo de parto no es inducido antes de las 39 semanas del embarazo excepto que haya un problema con el beb o de la [place].  Que en su caso sea inducido depende de ciertos factores, por ejemplo:   Su estado de salud y el de su beb.  Cuntas semanas tiene de [place].  El [place] de madurez de los pulmones del beb.  El estado del cuello uterino.  La posicin del beb. MOTIVOS PARA INDUCIR EL TRABAJO DE PARTO   La salud del beb o de la madre estn en riesgo.  El embarazo se ha pasado 1 semana o ms.  Se ha roto la bolsa de [place], pero el parto no empieza por s mismo.  La madre tiene un problema de salud o una enfermedad grave, como hipertensin arterial, infeccin, desprendimiento de la placenta o diabetes.  Hay poca cantidad de lquido amnitico alrededor del beb.  El beb tiene sufrimiento fetal. MOTIVOS PARA NO INDUCIR EL TRABAJO DE PARTO  La induccin del parto puede no ser Neomia Dear buena idea si:   Se observa que su beb no tolerar el trabajo de [place].  La induccin es slo ms conveniente.  Usted quiere que el beb nazca en una fecha determinada, como un da de [place].  Ha tenido cirugas previas en el tero, como una miomectoma o la extraccin de fibromas.  La placenta se encuentra muy baja en el tero y obstruye la abertura del cuello del tero (placenta previa).  El beb no est en posicin de cabeza.  El cordn umbilical cae por el canal del parto, frente al beb. En este caso podra cortarse el  suministro de sangre de oxgeno al beb.  Usted ha tenido antes un parto por cesrea.  Hay circunstancias inusuales, como que el beb sea [employer]. RIESGOS Y COMPLICACIONES  Pueden ocurrir problemas en el proceso de induccin y podr ser necesario modificar los planes segn desarrollo de los acontecimientos. Algunos de los riesgos de la induccin son:   Cambio en la frecuencia cardaca fetal, tales como que sea muy alta, muy baja, o errtica.  Riesgo de sufrimiento fetal.  Riesgo de infeccin para la madre y el beb.  Aumento de la posibilidad de tener un parto por cesrea.  Posibilidad rara, pero aumentada de que la placenta se separe del tero (desprendimiento).  Rotura uterina (muy raro). Cuando la induccin es necesaria por razones mdicas, los beneficios de la induccin pueden ser [employer].  ANTES DEL PROCEDIMIENTO  El mdico controlar el cuello y la posicin del beb. Esto lo ayudar a decidir si est en condiciones de una induccin al [employer].  PROCEDIMIENTO  Se pueden utilizar varios mtodos de induccin del [place], como:   Tomar medicamentos como prostaglandinas para dilatar y Customer service manager el cuello del tero. Este medicamento tambin iniciar las contracciones. Podr ser administrado por va oral o mediante la insercin de un supositorio en la vagina.  Podrn insertarle un tubo delgado (catter) con un globo en el extremo en la vagina para dilatar  el cuello del tero. Una vez insertado, el globo se expande con agua, lo que hace que el cuello uterino se abra.  Ruptura de las [place]. El mdico inserta un dedo entre el cuello del tero y las [place], lo que hace que el cuello del tero se estire y podr hacer que el tero se Technical sales engineer. Esto se suele hacer durante una visita al [facility]. Se la enviar a su casa a esperar a que las contracciones comiencen. Luego vendr para la induccin.  Ruptura de la bolsa de aguas. Su mdico har un Audiological scientist  amnitico con un instrumento pequeo. Una vez que se rompe el saco amnitico, las Therapist, occupational. Pueden pasar algunas horas para ver su efecto.  Tomar medicamentos para desencadenar o reforzar las contracciones. Este medicamento se administra por va intravenosa a travs de un tubo en el brazo. Todos los mtodos de induccin, adems la rotura de [place], se Futures trader hospital. La induccin se Mattel, de modo que usted y el beb sean atentamente controlados.  DESPUS DEL PROCEDIMIENTO  Algunas inducciones pueden llevar hasta 2 o 3 das. Dependiendo del cuello del tero, por lo general toma menos tiempo. Se tarda ms tiempo cuando se induce al inicio del Psychiatrist o si es Contractor. Si la madre est todava embarazada y han realizado la induccin durante 2 a 3 das, ser [employer] sea [place] a su casa o que tenga un parto por Copy.  Document Released: 04/18/2007 Document Revised: 04/03/2011 [facility] Patient Information 2014 [place], [place].  Parto vaginal luego de una cesrea (Vaginal Birth After Cesarean Delivery) Un parto vaginal luego de un parto por cesrea es dar a luz por la vagina luego de haber dado a luz por medio de una intervencin [place]. En el pasado, si una mujer tena un beb por cesrea, todos los partos posteriores deban hacerse por cesrea. Esto ya no es as. Puede ser seguro para la mam intentar un parto vaginal luego de una cesrea. La decisin final de tener un parto vaginal o por cesrea debe tomarse en conjunto, entre la paciente y el mdico. [place] riesgos y los beneficios debern evaluarse con relacin a los motivos y al tipo de cesrea previa LAS MUJERES QUE QUIEREN TENER UN PARTO VAGINAL, DEBEN CONSULTAR CON SU MDICO PARA ASEGURARSE QUE:  La cesrea anterior se realiz con una incisin uterina transversal (no con una incisin vertical clsica).  El canal de parto es lo suficientemente grande como para que pase el  [place].  No ha sido sometida a otras operaciones del tero.  Durante el trabajo de parto le realizarn un monitoreo electrnico fetal, en todo momento.  Es necesario que haya un quirfano disponible y listo en caso de necesitar una cesrea de emergencia.  Un cirujano y personal de quirfano estarn disponibles en todo momento durante el [place] de parto, para realizar una cesrea en caso de ser necesario.  Habr un anestesista disponible en caso de necesitar una cesrea de emergencia.  La nursery est lista cuenta con personal especializado y el equipo disponible para cuidar al beb en caso de emergencia. BENEFICIOS  Permanencia ms breve en el hospital.  Menores costos en el parto, la nurse y el hospital.  Menos prdida de sangre y menos probabilidad de necesitar una transfusin sangunea.  Menos probabilidad de tener fiebre o molestias como consecuencia de una ciruga mayor  Menos riesgo de cogulos sanguneos.  Menos riesgo de sufrir infecciones.  Recuperacin ms rpida luego  del alta mdica.  Menos riesgo de complicaciones quirrgicas, como apertura o hernia de la incisin.  Disminucin del riesgo de lesiones a otros rganos  Menor riesgo de remocin del tero (histerectoma)  Menor riesgo de que la placenta cubra parcial o completamente la abertura del tero (placenta previa) en embarazos futuros  Posibilidad de tener una familia grande, si lo desea. RIESGOS  Ruptura del tero.  Si el tero se rompe Production manager.  Todas las complicaciones de [place] mayor y lesiones en otros rganos.  Hemorragia excesiva, cogulos e infeccin.  [place] Puntuacin Apgar baja (mtodo que evala al recin nacido segn su apariencia, pulso, muecas, actividad y respiracion) y ms riesgos para el beb.  Hay mas riesgo de ruptura del tero si se induce o aumenta el trabajo de [place].  Hay un mayor riesgo de ruptura uterina si se usan medicamentos para madurar  el cuello. NO DEBE LLEVARSE A CABO SI:  La cesrea previa se realiz con una incicin vertical (clsica) o con forma de T, o usted no sabe cul de [employer] han practicado.  Ha sufrido ruptura del tero.  Le han practicado una ciruga de tero.  Tiene problemas mdicos u obsttricos.  El beb est en problemas.  Tuvo dos cesreas previas y ningn parto vaginal. OTRAS COSAS QUE DEBE SABER:  La anestesia peridural es segura.  Es seguro dar vuelta al beb si se encuentra de nalgas (intentar una versin ceflica externa).  Es seguro intentarlo en caso de mellizos.  Los embarazos de ms de 40 semanas no tienen xito con este procedimiento.  Hay un aumento de fracasos en embarazadas obesas.  Hay un aumento del porcentaje de fracasos si el beb pesa 4 Kg o ms.  Hay aumento en el porcentaje de fracasos si el intervalo entre la operacin cesrea y el parto vaginal es de menos de 19 meses.  Hay un aumento en el porcentaje de fracasos si ha sufrido preeclampsia hipertensin arterial, protenas en la orina e hinchazn del rostro y las extremidades.  El parto vaginal ser muy exitoso si tuvo un parto vaginal previo.  Tambin es [employer] caso que el [place] de parto comience espontneamente antes de la fecha.  El parto vaginal luego de Neomia Dear cesrea es similar a un parto espontneo vaginal normal. Es importante que converse con su mdico desde comienzos del Psychiatrist de modo que pueda Google, beneficios y opciones. De este modo tendr tiempo de decidir que es lo mejor en su caso particular en relacin a su parto por cesrea anterior. Hay que tener en cuenta que puede haber cambios en la madre durante el [place], lo que hace necesario cambiar su decisin o la del mdico. Los consejos, preocupaciones y decisiones debern documentarse en la historia clnica y debe ser firmada por todas las partes. Document Released: 06/28/2007 Document Revised: 04/03/2011 [facility] Patient  Information 2014 [place], [place].  Parto vaginal  (Vaginal Delivery) En Restaurant manager, fast food, su mdico debe estar seguro de que usted est en trabajo de parto. Algunos signos son:  Puede haber eliminado el "tapn mucoso" antes que comience el trabajo de [place]. Se trata de una pequea cantidad de mucus con sangre.  Tiene contracciones uterinas regulares.  El Bank [employer] las contracciones se acorta.  Las molestias y Chief Technology Officer se hacen gradualmente ms intensos.  El dolor se ubica principalmente en la espalda.  Los dolores empeoran al [employer].  El cuello del tero (la apertura del tero) se hace ms delgada,  comienza a borrarse, y se abre (se dilata). Una vez que se encuentre en [place] parto y sea admitida en el hospital, el mdico har lo siguiente:  Un examen fsico completo.  Controlar sus signos vitales (presin arterial, pulso, temperatura y la frecuencia cardaca fetal).  Realizar un examen vaginal (usando un guante estril y lubricante) para determinar:  La posicin (presentacin) del beb (ceflica [vertex] o nalgas primero).  El nivel (plano) de la cabeza del beb en el canal de parto.  El borramiento y dilatacin del cuello del tero.  Le rasurarn el vello pbico y le aplicarn una enema segn lo considere el mdico y las circunstancias.  Generalmente se coloca un monitor electrnico sobre el abdomen. El monitor sigue la duracin e intensidad de las contracciones, as como la frecuencia cardaca del beb.  Generalmente, el profesional inserta una va intravenosa en el brazo para administrarle agua azucarada. Esta es una medida de precaucin, de modo que puedan administrarle rpidamente medicamentos durante el [place] de [place]. EL TRABAJO DE PARTO Y PARTO NORMALES SE DIVIDEN EN 3 ETAPAS: Primera etapa Comienzan las contracciones regulares y el cuello comienza a borrarse y dilatarse. Esta etapa puede durar entre 3 y 15 horas. El final de la primera etapa se considera  cuando el cuello est borrado en un 100% y se ha dilatado 10 cm. Le administrarn analgsicos por:  Inyeccin (morfina, demerol, etc.).  Anestesia regional (espinal, caudal o epidural, anestsicos colocados en diferentes regiones de la columna vertebral). Podrn administrarle medicamentos para el dolor en la regin paracervical, que consiste en la aplicacin de un anestsico inyectable en cada uno de los lados del cuello del tero. La embarazada puede requerir un "parto natural", es decir no recibir medicamentos o anestesia durante el [place] y [place]. Segunda etapa En este momento el beb baja a travs del canal de parto (vagina) y nace. Esto puede durar entre 1 y 4 horas. A medida que el beb asoma la cabeza por el canal de parto, podr sentir una sensacin similar a cuando mueve el intestino. Sentir el impulse de empujar con fuerza hasta que el nio salga. A medida que la cabecita baja, el mdico decidir si realiza una episiotoma (corte en el perineo y rea de la vagina) para evitar la ruptura de los tejidos. Luego del nacimiento del beb y la expulsin de la placenta, la episiotoma se sutura. En algunos casos se coloca a la madre una mscara con xido nitroso para Research officer, political party respiracin y Engineer, materials. El final de la etapa 2 se produce cuando el beb ha salido completamente. Luego, cuando el cordn umbilical deja de pulsar, se pinza y se corta. Tercera etapa La tercera etapa comienza luego que el beb ha nacido y finaliza luego de la expulsin de la placenta. Generalmente esto lleva entre 5 y 30 minutos. Luego de la expulsin de la placenta, le aplicarn un medicamento por va intravenosa para ayudar a Engineer, materials y Psychiatric nurse. En la tercera etapa no hay dolor y generalmente no son necesarios los analgsicos. Si le han realizado una episiotoma, es el momento de Sales promotion account executive. Luego del parto, la mam es observada y controlada exhaustivamente durante 1  2 horas para  verificar que no hay sangrado en el post parto (hemorragias). Si pierde [employer], le administrarn un medicamento para Engineer, manufacturing tero y Comptroller. Document Released: 12/23/2007 Document Revised: 10/04/2011 [facility] Patient Information 2014 [place], [place].

## 2012-06-12 NOTE — Progress Notes (Signed)
Ctx q 10-15 min, strong, bloody show. NST/AFI 5/23.  IOL on 5/28 at 7 am.

## 2012-06-13 ENCOUNTER — Telehealth (HOSPITAL_COMMUNITY): Payer: Self-pay | Admitting: *Deleted

## 2012-06-13 NOTE — Telephone Encounter (Signed)
Preadmission screen Interpreter number [id number] 

## 2012-06-14 ENCOUNTER — Ambulatory Visit (INDEPENDENT_AMBULATORY_CARE_PROVIDER_SITE_OTHER): Payer: Self-pay | Admitting: *Deleted

## 2012-06-14 VITALS — BP 106/58

## 2012-06-14 DIAGNOSIS — O480 Post-term pregnancy: Secondary | ICD-10-CM

## 2012-06-14 NOTE — Progress Notes (Signed)
P = 75   Pt informed of appt for IOL on 5/28 @ 0700 - voiced understanding.  Interpreter - Marchelle Folks present for entire visit.

## 2012-06-16 NOTE — Progress Notes (Signed)
5/23 NST reviewed and reactive 

## 2012-06-18 ENCOUNTER — Encounter (HOSPITAL_COMMUNITY): Payer: Self-pay | Admitting: Anesthesiology

## 2012-06-18 ENCOUNTER — Encounter (HOSPITAL_COMMUNITY): Payer: Self-pay | Admitting: *Deleted

## 2012-06-18 ENCOUNTER — Inpatient Hospital Stay (HOSPITAL_COMMUNITY): Payer: Medicaid Other | Admitting: Anesthesiology

## 2012-06-18 ENCOUNTER — Inpatient Hospital Stay (HOSPITAL_COMMUNITY)
Admission: AD | Admit: 2012-06-18 | Discharge: 2012-06-20 | DRG: 774 | Disposition: A | Discharge status: Home / Self Care | Payer: Medicaid Other | Source: Ambulatory Visit | Attending: Obstetrics & Gynecology | Admitting: Obstetrics & Gynecology

## 2012-06-18 DIAGNOSIS — O34219 Maternal care for unspecified type scar from previous cesarean delivery: Secondary | ICD-10-CM

## 2012-06-18 DIAGNOSIS — IMO0001 Reserved for inherently not codable concepts without codable children: Secondary | ICD-10-CM

## 2012-06-18 LAB — CBC
MCH: 29.2 pg (ref 26.0–34.0)
MCHC: 33.8 g/dL (ref 30.0–36.0)
MCV: 86.4 fL (ref 78.0–100.0)
MCV: 86.9 fL (ref 78.0–100.0)
Platelets: 133 10*3/uL — ABNORMAL LOW (ref 150–400)
Platelets: 133 10*3/uL — ABNORMAL LOW (ref 150–400)
Platelets: 130 10*3/uL — ABNORMAL LOW (ref 150–400)
RBC: 4.49 MIL/uL (ref 3.87–5.11)
RBC: 4.01 MIL/uL (ref 3.87–5.11)
RBC: 3.82 MIL/uL — ABNORMAL LOW (ref 3.87–5.11)
RDW: 15.9 % — ABNORMAL HIGH (ref 11.5–15.5)
RDW: 16.2 % — ABNORMAL HIGH (ref 11.5–15.5)
WBC: 16.7 10*3/uL — ABNORMAL HIGH (ref 4.0–10.5)
WBC: 14.2 10*3/uL — ABNORMAL HIGH (ref 4.0–10.5)

## 2012-06-18 LAB — RPR: RPR Ser Ql: NONREACTIVE

## 2012-06-18 LAB — TYPE AND SCREEN: ABO/RH(D): O POS

## 2012-06-18 MED ORDER — ACETAMINOPHEN 325 MG PO TABS: 650.0000 mg | ORAL_TABLET | ORAL | Status: DC | PRN

## 2012-06-18 MED ORDER — LIDOCAINE HCL (PF) 1 % IJ SOLN
INTRAMUSCULAR | Status: DC | PRN
  Administered 2012-06-18 (×4): 4 mL

## 2012-06-18 MED ORDER — DIPHENHYDRAMINE HCL 50 MG/ML IJ SOLN: 12.5000 mg | INTRAMUSCULAR | Status: DC | PRN

## 2012-06-18 MED ORDER — BISACODYL 10 MG RE SUPP: 10.0000 mg | Freq: Every day | RECTAL | Status: DC | PRN

## 2012-06-18 MED ORDER — PHENYLEPHRINE 40 MCG/ML (10ML) SYRINGE FOR IV PUSH (FOR BLOOD PRESSURE SUPPORT)
80.0000 ug | PREFILLED_SYRINGE | INTRAVENOUS | Status: DC | PRN
  Filled 2012-06-18: qty 2

## 2012-06-18 MED ORDER — SIMETHICONE 80 MG PO CHEW: 80.0000 mg | CHEWABLE_TABLET | ORAL | Status: DC | PRN

## 2012-06-18 MED ORDER — FENTANYL 2.5 MCG/ML BUPIVACAINE 1/10 % EPIDURAL INFUSION (WH - ANES)
14.0000 mL/h | INTRAMUSCULAR | Status: DC | PRN
  Administered 2012-06-18: 14 mL/h via EPIDURAL
  Filled 2012-06-18: qty 125

## 2012-06-18 MED ORDER — OXYTOCIN BOLUS FROM INFUSION
500.0000 mL | INTRAVENOUS | Status: DC
  Administered 2012-06-18: 500 mL via INTRAVENOUS

## 2012-06-18 MED ORDER — FERROUS SULFATE 325 (65 FE) MG PO TABS
325.0000 mg | ORAL_TABLET | Freq: Two times a day (BID) | ORAL | Status: DC
  Administered 2012-06-19 – 2012-06-20 (×3): 325 mg via ORAL
  Filled 2012-06-18 (×3): qty 1

## 2012-06-18 MED ORDER — OXYTOCIN 40 UNITS IN LACTATED RINGERS INFUSION - SIMPLE MED
62.5000 mL/h | INTRAVENOUS | Status: DC
  Filled 2012-06-18: qty 1000

## 2012-06-18 MED ORDER — LACTATED RINGERS IV SOLN: 500.0000 mL | INTRAVENOUS | Status: DC | PRN

## 2012-06-18 MED ORDER — WITCH HAZEL-GLYCERIN EX PADS: 1.0000 "application " | MEDICATED_PAD | CUTANEOUS | Status: DC | PRN

## 2012-06-18 MED ORDER — OXYTOCIN 40 UNITS IN LACTATED RINGERS INFUSION - SIMPLE MED
1.0000 m[IU]/min | INTRAVENOUS | Status: DC
Start: 2012-06-18 — End: 2012-06-18
  Administered 2012-06-18: 2 m[IU]/min via INTRAVENOUS

## 2012-06-18 MED ORDER — OXYTOCIN 10 UNIT/ML IJ SOLN
INTRAMUSCULAR | Status: AC
  Filled 2012-06-18: qty 2

## 2012-06-18 MED ORDER — ONDANSETRON HCL 4 MG/2ML IJ SOLN: 4.0000 mg | INTRAMUSCULAR | Status: DC | PRN

## 2012-06-18 MED ORDER — PRENATAL MULTIVITAMIN CH
1.0000 | ORAL_TABLET | Freq: Every day | ORAL | Status: DC
  Administered 2012-06-19: 1 via ORAL
  Filled 2012-06-18: qty 1

## 2012-06-18 MED ORDER — OXYCODONE-ACETAMINOPHEN 5-325 MG PO TABS: 1.0000 | ORAL_TABLET | ORAL | Status: DC | PRN

## 2012-06-18 MED ORDER — DIPHENHYDRAMINE HCL 25 MG PO CAPS: 25.0000 mg | ORAL_CAPSULE | Freq: Four times a day (QID) | ORAL | Status: DC | PRN

## 2012-06-18 MED ORDER — IBUPROFEN 600 MG PO TABS
600.0000 mg | ORAL_TABLET | Freq: Four times a day (QID) | ORAL | Status: DC
  Administered 2012-06-19 – 2012-06-20 (×7): 600 mg via ORAL
  Filled 2012-06-18 (×7): qty 1

## 2012-06-18 MED ORDER — CITRIC ACID-SODIUM CITRATE 334-500 MG/5ML PO SOLN: 30.0000 mL | ORAL | Status: DC | PRN

## 2012-06-18 MED ORDER — MEASLES, MUMPS & RUBELLA VAC ~~LOC~~ INJ: 0.5000 mL | INJECTION | Freq: Once | SUBCUTANEOUS | Status: DC

## 2012-06-18 MED ORDER — ONDANSETRON HCL 4 MG PO TABS: 4.0000 mg | ORAL_TABLET | ORAL | Status: DC | PRN

## 2012-06-18 MED ORDER — LIDOCAINE HCL (PF) 1 % IJ SOLN
30.0000 mL | INTRAMUSCULAR | Status: DC | PRN
  Filled 2012-06-18 (×2): qty 30

## 2012-06-18 MED ORDER — ZOLPIDEM TARTRATE 5 MG PO TABS: 5.0000 mg | ORAL_TABLET | Freq: Every evening | ORAL | Status: DC | PRN

## 2012-06-18 MED ORDER — PHENYLEPHRINE 40 MCG/ML (10ML) SYRINGE FOR IV PUSH (FOR BLOOD PRESSURE SUPPORT)
80.0000 ug | PREFILLED_SYRINGE | INTRAVENOUS | Status: DC | PRN
  Filled 2012-06-18: qty 2
  Filled 2012-06-18: qty 5

## 2012-06-18 MED ORDER — OXYCODONE-ACETAMINOPHEN 5-325 MG PO TABS
1.0000 | ORAL_TABLET | ORAL | Status: DC | PRN
  Administered 2012-06-18: 1 via ORAL
  Administered 2012-06-19 (×3): 2 via ORAL
  Administered 2012-06-20: 1 via ORAL
  Administered 2012-06-20: 2 via ORAL
  Filled 2012-06-18 (×3): qty 2
  Filled 2012-06-18 (×2): qty 1
  Filled 2012-06-18 (×2): qty 2

## 2012-06-18 MED ORDER — IBUPROFEN 600 MG PO TABS: 600.0000 mg | ORAL_TABLET | Freq: Four times a day (QID) | ORAL | Status: DC | PRN

## 2012-06-18 MED ORDER — LACTATED RINGERS IV SOLN
500.0000 mL | Freq: Once | INTRAVENOUS | Status: AC
  Administered 2012-06-18: 500 mL via INTRAVENOUS

## 2012-06-18 MED ORDER — MISOPROSTOL 200 MCG PO TABS: 1000.0000 ug | ORAL_TABLET | Freq: Once | ORAL | Status: AC

## 2012-06-18 MED ORDER — HYDROMORPHONE HCL PF 1 MG/ML IJ SOLN
INTRAMUSCULAR | Status: AC
  Filled 2012-06-18: qty 1

## 2012-06-18 MED ORDER — OXYTOCIN 40 UNITS IN LACTATED RINGERS INFUSION - SIMPLE MED: 62.5000 mL/h | INTRAVENOUS | Status: DC | PRN

## 2012-06-18 MED ORDER — LANOLIN HYDROUS EX OINT: TOPICAL_OINTMENT | CUTANEOUS | Status: DC | PRN

## 2012-06-18 MED ORDER — MISOPROSTOL 200 MCG PO TABS
ORAL_TABLET | ORAL | Status: AC
  Administered 2012-06-18: 1000 ug via RECTAL
  Filled 2012-06-18: qty 5

## 2012-06-18 MED ORDER — BENZOCAINE-MENTHOL 20-0.5 % EX AERO: 1.0000 "application " | INHALATION_SPRAY | CUTANEOUS | Status: DC | PRN

## 2012-06-18 MED ORDER — LACTATED RINGERS IV SOLN
INTRAVENOUS | Status: DC
  Administered 2012-06-18 (×2): via INTRAVENOUS

## 2012-06-18 MED ORDER — TERBUTALINE SULFATE 1 MG/ML IJ SOLN: 0.2500 mg | Freq: Once | INTRAMUSCULAR | Status: DC | PRN

## 2012-06-18 MED ORDER — SENNOSIDES-DOCUSATE SODIUM 8.6-50 MG PO TABS
2.0000 | ORAL_TABLET | Freq: Every day | ORAL | Status: DC
  Administered 2012-06-19: 2 via ORAL

## 2012-06-18 MED ORDER — EPHEDRINE 5 MG/ML INJ
10.0000 mg | INTRAVENOUS | Status: DC | PRN
  Filled 2012-06-18: qty 4
  Filled 2012-06-18: qty 2

## 2012-06-18 MED ORDER — DIBUCAINE 1 % RE OINT: 1.0000 "application " | TOPICAL_OINTMENT | RECTAL | Status: DC | PRN

## 2012-06-18 MED ORDER — NALBUPHINE SYRINGE 5 MG/0.5 ML
10.0000 mg | INJECTION | INTRAMUSCULAR | Status: DC | PRN
  Filled 2012-06-18: qty 1

## 2012-06-18 MED ORDER — FLEET ENEMA 7-19 GM/118ML RE ENEM: 1.0000 | ENEMA | Freq: Every day | RECTAL | Status: DC | PRN

## 2012-06-18 MED ORDER — METHYLERGONOVINE MALEATE 0.2 MG/ML IJ SOLN
INTRAMUSCULAR | Status: AC
  Filled 2012-06-18: qty 1

## 2012-06-18 MED ORDER — EPHEDRINE 5 MG/ML INJ
10.0000 mg | INTRAVENOUS | Status: DC | PRN
  Filled 2012-06-18: qty 2

## 2012-06-18 MED ORDER — TETANUS-DIPHTH-ACELL PERTUSSIS 5-2.5-18.5 LF-MCG/0.5 IM SUSP
0.5000 mL | Freq: Once | INTRAMUSCULAR | Status: AC
  Administered 2012-06-19: 0.5 mL via INTRAMUSCULAR
  Filled 2012-06-18: qty 0.5

## 2012-06-18 MED ORDER — ONDANSETRON HCL 4 MG/2ML IJ SOLN: 4.0000 mg | Freq: Four times a day (QID) | INTRAMUSCULAR | Status: DC | PRN

## 2012-06-18 NOTE — Progress Notes (Signed)
Provider did not want to insert a foley catheter at this time. Will continue to monitor

## 2012-06-18 NOTE — Progress Notes (Signed)
Interpreter called because pt. Felt her heart rate increase, and she was fearful. Her heart rate has now returned to normal. Pt. States she has had tachycardia a few months ago and was worried. Will continue to monitor

## 2012-06-18 NOTE — Progress Notes (Signed)
Patient ID: Rhonda Townsend, female   DOB: 12/19/1980, 32 y.o.   MRN: [id number]   S:  Pt feeling pressure  O:   Filed Vitals:   06/18/12 1532  BP: 115/68  Pulse: 79  Temp:   Resp: 18    Cervix:  8/80/-1, bulging forebag - AROM, moderate meconium FHTs;  130, mod var, accels present, no decels TOCO:  q 5-6 min  A/P 32 y.o. [id number] at [redacted]w[redacted]d with SOOL Progressing without intervention SROM earlier with AROM of forebag with this check. Anticipate SVD FHTs reactive  Napoleon Form, MD

## 2012-06-18 NOTE — Progress Notes (Signed)
Patient ID: Rhonda Townsend, female   DOB: 1980/07/17, 32 y.o.   MRN: [id number]   S:  Comfortable with epidural, feels ctx and pressure  O:   Filed Vitals:   06/18/12 1207 06/18/12 1208 06/18/12 1212 06/18/12 1213  BP: 115/67  111/68   Pulse: 87 91 73 74  Temp:      TempSrc:      Resp: 18  20   Height:      Weight:      SpO2: 98%  100%     Cervix:  6/80/-2, bulging bag  FHTs:  135, mod var, accels present, no decels TOCO:  q 5 min  A/P 32 y.o. [id number] at [redacted]w[redacted]d with SOL  - Progressing without intervention - Anticipate SVD  Napoleon Form, MD

## 2012-06-18 NOTE — H&P (Signed)
Rhonda Townsend is a 32 y.o. female [phone] at [redacted]w[redacted]d presenting for contractions starting around 7 am today. No loss of fluid or bleeding. Baby moving well.  Receives care at [facility] low risk clinic. No complications this pregnancy. Prior c-section in [place] for breech per pt (notes say for non-reassuring fetal heart tones).  3 successful VBACs. VBAC consent signed and under media tab.     Maternal Medical History:  Reason for admission: Contractions.   Contractions: Onset was 3-5 hours ago.   Frequency: regular.   Perceived severity is strong.    Fetal activity: Perceived fetal activity is normal.   Last perceived fetal movement was within the past hour.    Prenatal complications: no prenatal complications Prenatal Complications - Diabetes: none.    OB History   Grav Para Term Preterm Abortions TAB SAB Ect Mult Living   5 4 3 1  0 0 0 0 0 4     Past Medical History  Diagnosis Date  . Tachycardia 2014  . Medical history non-contributory    Past Surgical History  Procedure Laterality Date  . Cesarean section      2003 (1st preg in [place])   Family History: family history is negative for Alcohol abuse, and Arthritis, and Asthma, and Birth defects, and Cancer, and COPD, and Depression, and Diabetes, and Drug abuse, and Early death, and Hearing loss, and Heart disease, and Hypertension, and Hyperlipidemia, and Kidney disease, and Learning disabilities, and Mental illness, and Mental retardation, and Miscarriages / Stillbirths, and Stroke, and Vision loss, . Social History:  reports that she has never smoked. She has never used smokeless tobacco. She reports that she does not drink alcohol or use illicit drugs.   Prenatal Transfer Tool  Maternal Diabetes: No Genetic Screening: Declined Maternal Ultrasounds/Referrals: Normal Fetal Ultrasounds or other Referrals:  None Maternal Substance Abuse:  No Significant Maternal Medications:  None Significant Maternal Lab  Results:  Lab values include: Group B Strep negative Other Comments:  None  ROS Pertinent pos and neg listed in HPI.    Blood pressure 117/71, pulse 107, temperature 98.6 F (37 C), temperature source Oral, resp. rate 18, height 5\' 1"  (1.549 m), weight 78.019 kg (172 lb), last menstrual period 10/17/2011. Maternal Exam:  Uterine Assessment: Contraction strength is firm.  Contraction frequency is regular.   Abdomen: Fetal presentation: vertex  Introitus: Normal vulva. Normal vagina.  Pelvis: adequate for delivery.   Cervix: Cervix evaluated by digital exam.     Fetal Exam Fetal Monitor Review: Mode: ultrasound.   Baseline rate: 135.  Variability: moderate (6-25 bpm).   Pattern: accelerations present and no decelerations.    Fetal State Assessment: Category I - tracings are normal.     Physical Exam  Constitutional: She is oriented to person, place, and time. She appears well-developed and well-nourished. No distress.  HENT:  Head: Normocephalic and atraumatic.  Eyes: Conjunctivae and EOM are normal.  Neck: Normal range of motion. Neck supple.  Cardiovascular: Normal rate.   Respiratory: Effort normal. No respiratory distress.  GI: Soft. There is no tenderness. There is no rebound and no guarding.  Genitourinary:  Normal external genitalia and vagina. Cervix 4-5, soft, 50%, posterior. Vertex by exam.  Musculoskeletal: Normal range of motion. She exhibits no edema and no tenderness.  Neurological: She is alert and oriented to person, place, and time.  Skin: Skin is warm and dry.  Psychiatric: She has a normal mood and affect.      Prenatal labs: ABO, Rh:  O/POS/-- (02/05 1503) Antibody: NEG (02/05 1503) Rubella: 14.20 (02/05 1503) RPR: NON REAC (02/05 1503)  HBsAg: NEGATIVE (02/05 1503)  HIV: NON REACTIVE (02/05 1503)  GBS: Negative (04/23 0000)   Assessment/Plan: Admit to L&D FHTs reactive GBS negative Epidural when desired Anticipate successful  VBAC.   Napoleon Form 06/18/2012, 10:10 AM

## 2012-06-18 NOTE — Anesthesia Procedure Notes (Signed)
Epidural Patient location during procedure: OB Start time: 06/18/2012 11:25 AM  Staffing Performed by: anesthesiologist   Preanesthetic Checklist Completed: patient identified, site marked, surgical consent, pre-op evaluation, timeout performed, IV checked, risks and benefits discussed and monitors and equipment checked  Epidural Patient position: sitting Prep: site prepped and draped and DuraPrep Patient monitoring: continuous pulse ox and blood pressure Approach: midline Injection technique: LOR air  Needle:  Needle type: Tuohy  Needle gauge: 17 G Needle length: 9 cm and 9 Needle insertion depth: 5 cm cm Catheter type: closed end flexible Catheter size: 19 Gauge Catheter at skin depth: 10 cm Test dose: negative  Assessment Events: blood not aspirated, injection not painful, no injection resistance, negative IV test and no paresthesia  Additional Notes Discussed risk of headache, infection, bleeding, nerve injury and failed or incomplete block.  Patient voices understanding and wishes to proceed. Reason for block:procedure for pain

## 2012-06-18 NOTE — Progress Notes (Signed)
Patient ID: Rhonda Townsend, female   DOB: Jul 29, 1980, 32 y.o.   MRN: [id number]   S:  Pt uncomfortable with L side pain  O:   Filed Vitals:   06/18/12 1432 06/18/12 1503 06/18/12 1532 06/18/12 1602  BP: 109/69 117/66 115/68 109/66  Pulse: 88 76 79 84  Temp:    98.4 F (36.9 C)  TempSrc:    Oral  Resp: 20 20 18 18   Height:      Weight:      SpO2:        CERV:  8-9/80/-1  FHTs;  130, mod var, accels present, subtle earlies and some variables (short, mild) CTX:  q 2-6  A/P 32 y.o. [id number] at [redacted]w[redacted]d with SOOL - Protracted active phase. Will start pitocin. - Anticipate SVD  Napoleon Form, MD

## 2012-06-18 NOTE — Progress Notes (Signed)
Patient ID: Rhonda Townsend, female   DOB: September 14, 1980, 32 y.o.   MRN: [id number]  S: Pt feeling pressure  O:   Filed Vitals:   06/18/12 1532  BP: 115/68  Pulse: 79  Temp:   Resp: 18    Cervix:  8-9/80/-1 FHTs:  130, min-mod var, accels present, occasional short variable TOCO:  q 3-5 min  A/P 32 y.o. [id number] at [redacted]w[redacted]d with SOOL - Small progression from last check - Will start pitocin if no change in 1 hour - Anticipate SVD  Napoleon Form, MD

## 2012-06-18 NOTE — MAU Note (Addendum)
Contractions  Started this morning getting stronger, small amt of bloody mucous, no leaking

## 2012-06-18 NOTE — Anesthesia Preprocedure Evaluation (Addendum)
Anesthesia Evaluation  Patient identified by MRN, date of birth, ID band Patient awake    Reviewed: Allergy & Precautions, H&P , NPO status , Patient's Chart, lab work & pertinent test results, reviewed documented beta blocker date and time   History of Anesthesia Complications Negative for: history of anesthetic complications  Airway Mallampati: II TM Distance: >3 FB Neck ROM: full    Dental  (+) Teeth Intact   Pulmonary neg pulmonary ROS,  breath sounds clear to auscultation        Cardiovascular negative cardio ROS  Rhythm:regular Rate:Normal     Neuro/Psych negative neurological ROS  negative psych ROS   GI/Hepatic negative GI ROS, Neg liver ROS,   Endo/Other  negative endocrine ROS  Renal/GU negative Renal ROS     Musculoskeletal   Abdominal   Peds  Hematology negative hematology ROS (+)   Anesthesia Other Findings   Reproductive/Obstetrics (+) Pregnancy (h/o c/s x1 and VBAC x3)                           Anesthesia Physical Anesthesia Plan  ASA: II  Anesthesia Plan: Epidural   Post-op Pain Management:    Induction:   Airway Management Planned:   Additional Equipment:   Intra-op Plan:   Post-operative Plan:   Informed Consent: I have reviewed the patients History and Physical, chart, labs and discussed the procedure including the risks, benefits and alternatives for the proposed anesthesia with the patient or authorized representative who has indicated his/her understanding and acceptance.     Plan Discussed with:   Anesthesia Plan Comments:         Anesthesia Quick Evaluation

## 2012-06-19 ENCOUNTER — Encounter (HOSPITAL_COMMUNITY): Payer: Self-pay | Admitting: *Deleted

## 2012-06-19 ENCOUNTER — Inpatient Hospital Stay (HOSPITAL_COMMUNITY): Admission: RE | Admit: 2012-06-19 | Payer: Self-pay | Source: Ambulatory Visit

## 2012-06-19 LAB — CBC
HCT: 30.3 % — ABNORMAL LOW (ref 36.0–46.0)
Hemoglobin: 10.2 g/dL — ABNORMAL LOW (ref 12.0–15.0)
RBC: 3.5 MIL/uL — ABNORMAL LOW (ref 3.87–5.11)
WBC: 10 10*3/uL (ref 4.0–10.5)

## 2012-06-19 MED ORDER — HYDROXYZINE HCL 50 MG PO TABS
50.0000 mg | ORAL_TABLET | Freq: Four times a day (QID) | ORAL | Status: DC | PRN
  Administered 2012-06-19: 50 mg via ORAL
  Filled 2012-06-19: qty 1

## 2012-06-19 NOTE — Progress Notes (Signed)
Patient ID: Rhonda Townsend, female   DOB: 20-Jul-1980, 32 y.o.   MRN: [id number] S:Rhonda Townsend is a 32 y.o. [id number] s/p VBAC Called to the room by RN to evaluate the patient. Pt states that all of a sudden she started to feel really bad. She states she is nervous and worried. She reports that she has had a history of panic attacks in the past. She has not slept since the baby was born about 36 hours ago.  O: VSS NAD, appears anxious Fundus firm A/P: Anxiety Vistaril PRN Have older children go home so that the patient can sleep

## 2012-06-19 NOTE — Progress Notes (Addendum)
I examined pt and agree with documentation above and resident plan of care.  Assessed pt with [facility] present in the room; pt report pain in groin area, but mostly on left side.  Advised to continue pain medications and will reassess if not better. [employer]

## 2012-06-19 NOTE — Anesthesia Postprocedure Evaluation (Signed)
Anesthesia Post Note  Patient: Rhonda Townsend  Procedure(s) Performed: * No procedures listed *  Anesthesia type: Epidural  Patient location: Mother/Baby  Post pain: Pain level controlled  Post assessment: Post-op Vital signs reviewed  Last Vitals:  Filed Vitals:   06/19/12 0215  BP: 101/57  Pulse: 65  Temp: 36.8 C  Resp: 18    Post vital signs: Reviewed  Level of consciousness:alert  Complications: No apparent anesthesia complications

## 2012-06-19 NOTE — Lactation Note (Signed)
This note was copied from the chart of Rhonda Townsend. Lactation Consultation Note  Patient Name: Rhonda Townsend RUEAV'W Date: 06/19/2012 Reason for consult: Initial assessment   Maternal Data Formula Feeding for Exclusion: Yes Reason for exclusion: Mother's choice to formula and breast feed on admission Infant to breast within first hour of birth: Yes Does the patient have breastfeeding experience prior to this delivery?: Yes  Feeding Feeding Type: Breast Milk Feeding method: Breast Length of feed: 20 min  LATCH Score/Interventions                      Lactation Tools Discussed/Used     Consult Status Consult Status: Complete  Experienced BF mom reports that baby has been latching well. Reports that she breast fed her other children 2 years each.  Baby asleep on mom's chest at present. Spanish brochure given. No questions at present, To page for assist prn  Pamelia Hoit 06/19/2012, 10:56 AM

## 2012-06-19 NOTE — Progress Notes (Signed)
Post Partum Day 1 Subjective: up ad lib, voiding and tolerating PO Patient reports pain is not well controlled. Just received Percocet at 7:45 am.  No other complaints this am.  Objective: Blood pressure 101/57, pulse 65, temperature 98.2 F (36.8 C), temperature source Oral, resp. rate 18, height 5\' 1"  (1.549 m), weight 78.019 kg (172 lb), last menstrual period 10/17/2011, SpO2 99.00%, unknown if currently breastfeeding.  Physical Exam:  General: alert, cooperative and no distress Lochia: appropriate Uterine Fundus: firm DVT Evaluation: No evidence of DVT seen on physical exam. No cords or calf tenderness. No significant calf/ankle edema.   Recent Labs  06/18/12 2133 06/19/12 0700  HGB 11.2* 10.2*  HCT 33.2* 30.3*    Assessment/Plan: Patient is breast feeding. Plan for discharge tomorrow as delivery was complicated by PPH, Nuchal cord, Shoulder dystocia, and Code apgar.   LOS: 1 day   Everlene Other 06/19/2012, 7:58 AM

## 2012-06-20 ENCOUNTER — Ambulatory Visit: Payer: Self-pay

## 2012-06-20 MED ORDER — NORETHINDRONE 0.35 MG PO TABS: 1.0000 | ORAL_TABLET | Freq: Every day | ORAL | Status: DC

## 2012-06-20 MED ORDER — HYDROXYZINE HCL 50 MG PO TABS: 50.0000 mg | ORAL_TABLET | Freq: Four times a day (QID) | ORAL | Status: DC | PRN

## 2012-06-20 NOTE — Progress Notes (Signed)
UR chart review completed.  

## 2012-06-20 NOTE — Progress Notes (Signed)
I assisted Devin Rn with interpretation of discharge instructions.

## 2012-06-20 NOTE — Progress Notes (Signed)
I assisted Pediatrician with discharge orders for the Baby

## 2012-06-20 NOTE — Lactation Note (Signed)
This note was copied from the chart of Rhonda Townsend. Lactation Consultation Note Mom states br feeding is going very well. Baby is latched to the left side in cradle hold when I enter room. Mom is concerned about a blister on the left side.  Discussed the importance of good position and good latch. Mom is more comfortable when chin is tugged to widen gape. Comfort gels provided with instructions for use. Questions answered.  Enc mom to call lactation office if she has any concerns, and to attend the BFSG. Mom has limited [place], family present to assist with communication.   Patient Name: Rhonda Townsend [id number] Date: 06/20/2012     Maternal Data    Feeding    LATCH Score/Interventions                      Lactation Tools Discussed/Used     Consult Status      [name], [name] 06/20/2012, 3:17 PM

## 2012-06-20 NOTE — Discharge Summary (Signed)
Obstetric Discharge Summary Reason for Admission: onset of labor Prenatal Procedures: none Intrapartum Procedures: spontaneous vaginal delivery Postpartum Procedures: none Complications-Operative and Postpartum: none Hemoglobin  Date Value Range Status  06/19/2012 10.2* 12.0 - 15.0 g/dL Final     HCT  Date Value Range Status  06/19/2012 30.3* 36.0 - 46.0 % Final   Pt states that she slept well last night, and anxiety from last night was relieved with atarax.   Physical Exam:  General: alert, cooperative and no distress Lochia: appropriate Uterine Fundus: firm Incision: NA  DVT Evaluation: No evidence of DVT seen on physical exam.  Discharge Diagnoses: Term Pregnancy-delivered  Discharge Information: Date: 06/20/2012 Activity: unrestricted Diet: routine Medications: Ibuprofen and atarax PRN for anxiety Condition: stable Instructions: refer to practice specific booklet Discharge to: home Follow-up Information   Follow up with [facility] In 6 weeks.   Contact information:   [address] [place] [address] [phone]      Newborn Data: Live born female  Birth Weight: 8 lb 9.9 oz (3909 g) APGAR: 8, 9  Home with mother.  Rhonda Townsend 06/20/2012, 7:29 AM

## 2012-06-25 ENCOUNTER — Telehealth: Payer: Self-pay | Admitting: *Deleted

## 2012-06-25 NOTE — Telephone Encounter (Signed)
Message left on nurse voice mail by Sindy Guadeloupe RN from the [employer] program. She stated that she was doing a home visit today for pt who delivered on 06/18/12. The pt stated that she has been having increased vaginal bleeding since delivery. She is now saturating 6-7 pads per [place] w/clots the size of a 50 cent piece. I called pt w/[employer] interpreter [phone] and left message that pt should go to MAU today for evaluation of her bleeding. She may call back if she has additional questions. I also called the RN- Tammy and informed her of my call to the pt. She stated she will follow up by having her interpreter contact the pt later to be sure she went to MAU for evaluation.

## 2012-06-26 ENCOUNTER — Encounter (HOSPITAL_COMMUNITY): Payer: Self-pay | Admitting: *Deleted

## 2012-06-26 ENCOUNTER — Inpatient Hospital Stay (HOSPITAL_COMMUNITY)
Admission: AD | Admit: 2012-06-26 | Discharge: 2012-06-26 | Disposition: A | Discharge status: Home / Self Care | Payer: Medicaid Other | Source: Ambulatory Visit | Attending: Obstetrics & Gynecology | Admitting: Obstetrics & Gynecology

## 2012-06-26 DIAGNOSIS — K649 Unspecified hemorrhoids: Secondary | ICD-10-CM | POA: Insufficient documentation

## 2012-06-26 DIAGNOSIS — N939 Abnormal uterine and vaginal bleeding, unspecified: Secondary | ICD-10-CM

## 2012-06-26 DIAGNOSIS — K6289 Other specified diseases of anus and rectum: Secondary | ICD-10-CM | POA: Insufficient documentation

## 2012-06-26 DIAGNOSIS — O878 Other venous complications in the puerperium: Secondary | ICD-10-CM | POA: Insufficient documentation

## 2012-06-26 DIAGNOSIS — K644 Residual hemorrhoidal skin tags: Secondary | ICD-10-CM

## 2012-06-26 LAB — CBC
HCT: 32 % — ABNORMAL LOW (ref 36.0–46.0)
Hemoglobin: 10.6 g/dL — ABNORMAL LOW (ref 12.0–15.0)
MCH: 28.6 pg (ref 26.0–34.0)
MCV: 86.3 fL (ref 78.0–100.0)
RBC: 3.71 MIL/uL — ABNORMAL LOW (ref 3.87–5.11)
WBC: 8.9 10*3/uL (ref 4.0–10.5)

## 2012-06-26 MED ORDER — OXYCODONE-ACETAMINOPHEN 5-325 MG PO TABS
2.0000 | ORAL_TABLET | Freq: Once | ORAL | Status: AC
  Administered 2012-06-26: 2 via ORAL
  Filled 2012-06-26: qty 2

## 2012-06-26 MED ORDER — PRAMOXINE HCL 1 % RE FOAM: RECTAL | Status: DC | PRN

## 2012-06-26 MED ORDER — OXYCODONE-ACETAMINOPHEN 5-325 MG PO TABS: 1.0000 | ORAL_TABLET | Freq: Once | ORAL | Status: DC

## 2012-06-26 MED ORDER — DOCUSATE SODIUM 100 MG PO CAPS: 100.0000 mg | ORAL_CAPSULE | Freq: Two times a day (BID) | ORAL | Status: DC

## 2012-06-26 NOTE — MAU Provider Note (Signed)
History     CSN: [id number]  Arrival date and time: 06/26/12 3086   First Provider Initiated Contact with Patient 06/26/12 0138      Chief Complaint  Patient presents with  . Rectal Pain   HPI Rhonda Townsend is a 32 y.o. G5P4105 at 8 days post partum who presents with rectal pain.  Patient reports rectal pain since Saturday.  She states that the pain is severe and interferes with bowel movements.  She was recently seen by an RN at home and informed that she has hemorrhoids.  She has tried Tylenol and Ibuprofen with little relief.   She also reports increased vaginal bleeding.  She is using about 6-7 pads/day.   She denies any fever or abdominal pain.    Past Medical History  Diagnosis Date  . Tachycardia 2014  . Medical history non-contributory     Past Surgical History  Procedure Laterality Date  . Cesarean section      2003 (1st preg in [place])    Family History  Problem Relation Age of Onset  . Alcohol abuse Neg Hx   . Arthritis Neg Hx   . Asthma Neg Hx   . Birth defects Neg Hx   . Cancer Neg Hx   . COPD Neg Hx   . Depression Neg Hx   . Diabetes Neg Hx   . Drug abuse Neg Hx   . Early death Neg Hx   . Hearing loss Neg Hx   . Heart disease Neg Hx   . Hypertension Neg Hx   . Hyperlipidemia Neg Hx   . Kidney disease Neg Hx   . Learning disabilities Neg Hx   . Mental illness Neg Hx   . Mental retardation Neg Hx   . Miscarriages / Stillbirths Neg Hx   . Stroke Neg Hx   . Vision loss Neg Hx     History  Substance Use Topics  . Smoking status: Never Smoker   . Smokeless tobacco: Never Used  . Alcohol Use: No    Allergies: No Known Allergies  Prescriptions prior to admission  Medication Sig Dispense Refill  . ibuprofen (ADVIL,MOTRIN) 200 MG tablet Take 400 mg by mouth every 6 (six) hours as needed for pain.      . Prenatal Vit-Fe Fumarate-FA (MULTIVITAMIN-PRENATAL) 27-0.8 MG TABS Take 1 tablet by mouth daily at 12 noon.      . hydrOXYzine  (ATARAX/VISTARIL) 50 MG tablet Take 1 tablet (50 mg total) by mouth every 6 (six) hours as needed for anxiety.  20 tablet  0  . norethindrone (MICRONOR,CAMILA,ERRIN) 0.35 MG tablet Take 1 tablet (0.35 mg total) by mouth daily.  1 Package  11    ROS Per HPI Physical Exam   Blood pressure 112/67, pulse 77, temperature 98.9 F (37.2 C), temperature source Oral, resp. rate 16, height 5\' 1"  (1.549 m), weight 70.126 kg (154 lb 9.6 oz), last menstrual period 10/17/2011, currently breastfeeding.  Physical Exam Gen: resting comfortably in bed, NAD. Abd: soft, nontender, nondistended. Ext: no appreciable lower extremity edema bilaterally Neuro: No focal deficits. GU: normal appearing external genitalia Pelvic: No source of bleeding seen from Cervix.   Sutures in place in vagina.  No obvious source of bleeding. Rectal: ~1.5 cm external hemorrhoid noted.  Does not appear thrombosed.  No rectal bleeding appreciated.   MAU Course  Procedures  Assessment and Plan  32 y.o. G5P4105 at 8 days post partum who presents with rectal pain and increased vaginal bleeding. -  Rectal pain secondary to external hemorrhoid.  - No obvious source of bleeding identified.  CBC obtained and revealed stable hemoglobin at 10.6 (10.2 prior to discharge home after delivery).  No abdominal pain, fever, chills to suggest retained products. - Will treat pain with Percocet x 1. - Patient medically stable.  Will discharge home with Proctofoam.  Will encourage adequate fiber intake, increased water intake, and sitz baths.  In regards to vaginal bleeding, if does not improve in the next 24-48 hours, she will need repeat/further evaluation.  Everlene Other 06/26/2012, 1:39 AM   I have participated in the care of this patient and I agree with the above. Hurley Blevins 4:10 AM 06/26/2012

## 2012-06-26 NOTE — MAU Note (Signed)
Pt states she had mild rectal pain that started on Sat and has gotten worse and worse

## 2012-07-17 ENCOUNTER — Encounter: Payer: Self-pay | Admitting: Advanced Practice Midwife

## 2012-07-17 ENCOUNTER — Ambulatory Visit (INDEPENDENT_AMBULATORY_CARE_PROVIDER_SITE_OTHER): Payer: Self-pay | Admitting: Advanced Practice Midwife

## 2012-07-17 VITALS — BP 103/69 | HR 78 | Temp 98.2°F | Ht 61.0 in | Wt 147.0 lb

## 2012-07-17 DIAGNOSIS — O99345 Other mental disorders complicating the puerperium: Secondary | ICD-10-CM

## 2012-07-17 DIAGNOSIS — F329 Major depressive disorder, single episode, unspecified: Secondary | ICD-10-CM

## 2012-07-17 DIAGNOSIS — F53 Postpartum depression: Secondary | ICD-10-CM

## 2012-07-17 NOTE — Patient Instructions (Signed)
Levonorgestrel intrauterine device (IUD) Qu es este medicamento? El LEVONORGESTREL (DIU) es un dispositivo anticonceptivo (control de natalidad). Se utiliza para evitar el embarazo durante un perodo de hasta 5 aos. Este medicamento puede ser utilizado para otros usos; si tiene alguna pregunta consulte con su proveedor de atencin mdica o con su farmacutico. Qu le debo informar a mi profesional de la salud antes de tomar este medicamento? Necesita saber si usted presenta alguno de los siguientes problemas o situaciones: -exmen de Papanicolaou anormal -cncer de mama, cuello del tero o tero -diabetes -endometritis -si tiene una infeccin plvica o genital actual o en el pasado -tiene ms de una pareja sexual o si su pareja tiene ms de una pareja -enfermedad cardiaca -antecedente de embarazo tubrico o ectpico -problemas del sistema inmunolgico -DIU colocado -enfermedad heptica o tumor del hgado -problemas con la coagulacin o si toma diluyentes sanguneos -usa medicamentos intravenoso -forma inusual del tero -sangrado vaginal que no tiene explicacin -una reaccin alrgica o inusual al levonorgestrel, a otras hormonas, a la silicona o polietilenos, a otros medicamentos, alimentos, colorantes o conservantes -si est embarazada o buscando quedar embarazada -si est amamantando a un beb Cmo debo utilizar este medicamento? Un profesional de la salud coloca este dispositivo en el tero. Hable con su pediatra para informarse acerca del uso de este medicamento en nios. Puede requerir atencin especial. Sobredosis: Pngase en contacto inmediatamente con un centro toxicolgico o una sala de urgencia si usted cree que haya tomado demasiado medicamento. ATENCIN: Este medicamento es solo para usted. No comparta este medicamento con nadie. Qu sucede si me olvido de una dosis? No se aplica en este caso. Qu puede interactuar con este medicamento? No tome esta medicina con  ninguno de los siguientes medicamentos: -amprenavir -bosentano -fosamprenavir Esta medicina tambin puede interactuar con los siguientes medicamentos: -aprepitant -barbitricos para producir el sueo o para el tratamiento de convulsiones -bexaroteno -griseofulvina -medicamentos para tratar los convulsiones, tales como carbamazepina, etotona, felbamato, oxcarbazepina, fenitona, topiramato -modafinilo -pioglitazona -rifabutina -rifampicina -rifapentina -algunos medicamentos para tratar el virus VIH, tales como atazanavir, indinavir, lopinavir, nelfinavir, tipranavir, ritonavir -hierba de [place] -warfarina Puede ser que esta lista no menciona todas las posibles interacciones. Informe a su profesional de la salud de todos los productos a base de hierbas, medicamentos de venta libre o suplementos nutritivos que est tomando. Si usted fuma, consume bebidas alcohlicas o si utiliza drogas ilegales, indqueselo tambin a su profesional de la salud. Algunas sustancias pueden interactuar con su medicamento. A qu debo estar atento al usar este medicamento? Visite a su mdico o a su profesional de la salud para chequear su evolucin peridicamente. Visite a su mdico si usted o su pareja tiene relaciones sexuales con otras personas, se vuelve VIH positivo o contrae una enfermedad de transmisin sexual. Este medicamento no la protege de la infeccin por VIH (SIDA) ni de ninguna otra enfermedad de transmisin sexual. Puede controlar la ubicacin del DIU usted misma palpando con sus dedos limpios los hilos en la parte anterior de la vagina. No tire de los hilos. Es un buen hbito controlar la ubicacin del dispositivo despus de cada perodo menstrual. Si no slo siente los hilos sino que adems siente otra parte ms del DIU o si no puede sentir los hilos, consulte a su mdico inmediatamente. El DIU puede salirse por s solo. Puede quedar embarazada si el dispositivo se sale de su lugar. Utilice un  mtodo anticonceptivo adicional, como preservativos, y consulte a su proveedor de atencin mdica s observa que   el DIU se sali de Nature conservation officer. La utilizacin de tampones no cambia la posicin del DIU y no hay inconvenientes en usarlos durante su perodo. Qu efectos secundarios puedo tener al [employer] este medicamento? Efectos secundarios que debe informar a su mdico o a Producer, television/film/video de la salud tan pronto como sea posible: -Therapist, art como erupcin cutnea, picazn o urticarias, hinchazn de la cara, labios o lengua -fiebre, sntomas gripales -llagas genitales -alta presin sangunea -ausencia de un perodo menstrual durante 6 semanas mientras lo utiliza -Engineer, mining, Public librarian en las piernas -dolor o sensibilidad del plvico -dolor de cabeza repentino o severo -signos de Psychiatrist -calambres estomacales -falta de aliento repentina -problemas de coordinacin, del habla, al caminar -sangrado, flujo vaginal inusual -color amarillento de los ojos o la piel Efectos secundarios que, por lo general, no requieren atencin mdica (debe informarlos a su mdico o a su profesional de la salud si persisten o si son molestos): -acn -dolor de pecho -cambios en el deseo sexual o capacidad -cambios de peso -calambres, Research scientist (life sciences) o sensacin de [employer] se introduce el dispositivo -dolor de cabeza -sangrado menstruales irregulares en los primeros 3 a 6 meses de usar -nuseas Puede ser que esta lista no menciona todos los posibles efectos secundarios. Comunquese a su mdico por asesoramiento mdico [employer]. Usted puede informar los efectos secundarios a la FDA por telfono al 1-800-FDA-1088. Dnde debo guardar mi medicina? No se aplica en este caso. ATENCIN: Este folleto es un resumen. Puede ser que no cubra toda la posible informacin. Si usted tiene preguntas acerca de esta medicina, consulte con su mdico, su farmacutico o su profesional de Radiographer, therapeutic.   2012, Elsevier/Gold Standard. (03/23/2008 3:17:12 PM)Depresin postparto y depresin puerperal  (Postpartum Depression and Baby Blues) El perodo del postparto comienza inmediatamente despus del nacimiento del beb. Durante este tiempo, a menudo hay mucha alegra y emocin. Tambin es un tiempo de cambios considerables en la vida de [place]. Independientemente de las veces que [employer] da a luz, cada nio trae nuevos desafos y se modifica la dinmica de la familia. No es inusual tener sentimientos de emocin acompaados de cambios confusos en los estados de nimo, las emociones y los pensamientos. Todas las [employer] estn en riesgo de desarrollar depresin posparto o depresin puerperal. Estos cambios en el estado de nimo pueden ocurrir inmediatamente despus de dar a luz, o muchos meses despus. La depresin puerperal o la depresin posparto puede ser leve o grave. Adems, se puede resolver rpidamente, o puede ser un trastorno que dure [place].  CAUSAS  Los niveles elevados de hormonas y su rpido descenso son la causa principal de la depresin posparto y la depresin puerperal. Hay un conjunto de hormonas que se modifican radicalmente durante y despus del Psychiatrist. Los estrgenos y la progesterona generalmente disminuyen inmediatamente luego del [place]. Los niveles de hormona tiroidea y el cortisol y esteroides tambin disminuyen rpidamente. Entre otros factores que juegan un papel importante en estos cambios se incluyen los eventos importantes de la vida y los factores genticos.  FACTORES DE RIESGO  Si tiene cualquiera de los siguientes riesgos para la depresin posparto o depresin puerperal, sepa cules son los sntomas a tener en cuenta durante el perodo del posparto. Los factores de riesgo que pueden aumentar la probabilidad de sufrir este trastorno son:   Antecedentes familiares o personales de depresin.  Haber sufrido depresin mientras estuvo embarazada.  Haber sufrido trastornos  del estado de nimo en perodos premenstruales o  con el uso de anticonceptivos orales.  Haber sufrido ests excepcional durante la vida.  Tener conflictos maritales.  No tener una red de apoyo social.  Wilburt Finlay un beb con necesidades especiales.  Tener problemas de salud tales como diabetes. SNTOMAS  Los sntomas de la depresin puerperal son:   Electronics engineer en el estado de nimo, como ir desde la extrema felicidad a la extrema tristeza.  Disminucin de Cabin crew.  Dificultad para dormir.  Episodios de llanto, ganas de llorar.  Irritabilidad.  Ansiedad. Los sntomas de la depresion postparto generalmente comienzan dentro del primer mes despus de haber dado a luz. Estos sntomas son:   Dificultad para dormirse o somnolencia excesiva.  Marcada prdida de peso.  Agitacin.  Sentimientos de Haematologist.  Falta de inters en la actividad o la comida. La psicosis puerperal es una enfermedad muy preocupante y puede ser peligrosa. Afortunadamente, es un trastorno raro. Sufrir alguno de los siguientes sntomas es motivo de atencin mdica inmediata. Los sntomas de psicosis puerperal son:   Alucinaciones e ilusiones.  Conducta bizarra o desorganizada.  Confusin o desorientacin. DIAGNSTICO  El diagnstico se realiza por medio de la evaluacin de los sntomas. No hay pruebas mdicas o de laboratorio, que llevan a un diagnstico, pero existen varios cuestionarios que un mdico puede Chemical engineer para identificar la depresin posparto, la depresin o la psicosis puerperal. En algunos casos se utiliza una herramienta de evaluacin denominada [place] de Depresin Postnatal de [place] para diagnosticar este trastorno.  TRATAMIENTO  La depresin postparto por lo general desaparece por s sola en 1 a 2 semanas. El apoyo social es a menudo todo lo que se necesita. Debe ser alentada a dormir y Lawyer o suficiente. Ocasionalmente, es posible que se le administren  medicamentos para ayudarla a dormir.  La depresin posparto requiere tratamiento, ya que puede durar varios meses o ms tiempo si no se trata. El [address] incluir terapia individual o de grupo, medicamentos o ambos para Radio producer frente a los Marine scientist, fisiolgicos y psicolgicos que pueden desempear un papel en la depresin. El ejercicio regular, una dieta saludable, el descanso y el apoyo social tambin puede ser muy recomendables.  La psicosis puerperal es ms grave y necesita tratamiento inmediato. A menudo es necesaria la hospitalizacin.  INSTRUCCIONES PARA EL CUIDADO EN EL HOGAR   Descanse todo lo que pueda. Tome una siesta mientras el beb duerme.  Haga ejercicios regularmente. Para algunas mujeres es beneficioso el yoga y las caminatas.  Consuma una dieta balanceada y nutritiva.  Haga pequeas cosas que disfrute hacer. Tome una taza de t, dese un bao de espuma, lea su revista favorita o escuche la msica que ms [employer].  Evite el alcohol.  Pida ayuda con las tareas [address], la cocina, las compras o [place]. No trate de hacer todo.  Hable con la gente ms cercana a usted acerca de cmo se siente. [place] de [address], los miembros de su familia, amigos u otras madres recientes.  Trate de tener pensamientos positivos. Piense en las cosas que le resultan gratificantes.  No pase mucho tiempo sola.  Tome slo la medicacin que le indic el profesional.  Cumpla con todos los controles del postparto.  Hable con su mdico si tiene preocupaciones. SOLICITE ATENCIN MDICA SI:  [place] reaccin o problemas con su medicamento.  SOLICITE ATENCIN MDICA DE INMEDIATO SI:   Tiene ideas suicidas.  Siente que puede daar al beb o a Engineer, maintenance (IT). Document Released: 06/28/2007 Document Revised:  04/03/2011 ExitCare Patient Information 2014 [place], [place].

## 2012-07-17 NOTE — Progress Notes (Signed)
  See smart set notes Subjective:     Rhonda Townsend is a 32 y.o. female who presents for a postpartum visit. She is 4 weeks postpartum following a spontaneous vaginal delivery. I have fully reviewed the prenatal and intrapartum course. The delivery was at term. Outcome: spontaneous vaginal delivery. Anesthesia: none. Postpartum course has been Uneventful. Baby's course has been uneventful. Baby is feeding by breast. Bleeding staining only. Bowel function is normal. Bladder function is normal. Patient is not sexually active. Contraception method is IUD. Postpartum depression screening: positive.  The following portions of the patient's history were reviewed and updated as appropriate: allergies, current medications, past family history, past medical history, past social history, past surgical history and problem list.  Review of Systems Pertinent items are noted in HPI.   Objective:    BP 103/69  Pulse 78  Temp(Src) 98.2 F (36.8 C) (Oral)  Ht 5\' 1"  (1.549 m)  Wt 66.679 kg (147 lb)  BMI 27.79 kg/m2  Breastfeeding? Yes  General:  alert, cooperative and no distress   Breasts:  inspection negative, no nipple discharge or bleeding, no masses or nodularity palpable  Lungs: clear to auscultation bilaterally  Heart:  regular rate and rhythm, S1, S2 normal, no murmur, click, rub or gallop  Abdomen: soft, non-tender; bowel sounds normal; no masses,  no organomegaly   Vulva:  normal  Vagina: normal vagina Some relaxation  Cervix:  multiparous appearance  Corpus: normal size, contour, position, consistency, mobility, non-tender  Adnexa:  no mass, fullness, tenderness  Rectal Exam: Not performed.        Assessment:     Normal postpartum exam.   Postpartum depression, declines meds   Pap smear not done at today's visit.   Plan:    1. Contraception: IUD 2. Application for Mirena filled out. Reviewed insertion costs. Advised to premed with ibuprofen 3. Follow up in: 2 weeks  or as needed.  4.  Phone # for [facility] given. Agrees to counseling

## 2012-12-10 ENCOUNTER — Emergency Department (HOSPITAL_COMMUNITY): Payer: Self-pay

## 2012-12-10 ENCOUNTER — Encounter (HOSPITAL_COMMUNITY): Payer: Self-pay | Admitting: Emergency Medicine

## 2012-12-10 ENCOUNTER — Emergency Department (HOSPITAL_COMMUNITY)
Admission: EM | Admit: 2012-12-10 | Discharge: 2012-12-10 | Disposition: A | Discharge status: Home / Self Care | Payer: Self-pay | Attending: Emergency Medicine | Admitting: Emergency Medicine

## 2012-12-10 DIAGNOSIS — R109 Unspecified abdominal pain: Secondary | ICD-10-CM

## 2012-12-10 DIAGNOSIS — R1032 Left lower quadrant pain: Secondary | ICD-10-CM | POA: Insufficient documentation

## 2012-12-10 DIAGNOSIS — N949 Unspecified condition associated with female genital organs and menstrual cycle: Secondary | ICD-10-CM | POA: Insufficient documentation

## 2012-12-10 DIAGNOSIS — R110 Nausea: Secondary | ICD-10-CM | POA: Insufficient documentation

## 2012-12-10 DIAGNOSIS — Z3202 Encounter for pregnancy test, result negative: Secondary | ICD-10-CM | POA: Insufficient documentation

## 2012-12-10 DIAGNOSIS — R000 Tachycardia, unspecified: Secondary | ICD-10-CM | POA: Insufficient documentation

## 2012-12-10 DIAGNOSIS — N390 Urinary tract infection, site not specified: Secondary | ICD-10-CM | POA: Insufficient documentation

## 2012-12-10 DIAGNOSIS — Z79899 Other long term (current) drug therapy: Secondary | ICD-10-CM | POA: Insufficient documentation

## 2012-12-10 LAB — CBC WITH DIFFERENTIAL/PLATELET
Basophils Relative: 0 % (ref 0–1)
Hemoglobin: 13.5 g/dL (ref 12.0–15.0)
MCHC: 34.5 g/dL (ref 30.0–36.0)
Monocytes Relative: 5 % (ref 3–12)
Neutro Abs: 3.6 10*3/uL (ref 1.7–7.7)
Neutrophils Relative %: 68 % (ref 43–77)
RBC: 4.64 MIL/uL (ref 3.87–5.11)
WBC: 5.4 10*3/uL (ref 4.0–10.5)

## 2012-12-10 LAB — URINALYSIS, ROUTINE W REFLEX MICROSCOPIC
Hgb urine dipstick: NEGATIVE
Nitrite: NEGATIVE
Specific Gravity, Urine: 1.009 (ref 1.005–1.030)
Urobilinogen, UA: 0.2 mg/dL (ref 0.0–1.0)

## 2012-12-10 LAB — URINE MICROSCOPIC-ADD ON

## 2012-12-10 LAB — COMPREHENSIVE METABOLIC PANEL
ALT: 24 U/L (ref 0–35)
BUN: 11 mg/dL (ref 6–23)
CO2: 24 mEq/L (ref 19–32)
Calcium: 9.3 mg/dL (ref 8.4–10.5)
Creatinine, Ser: 0.66 mg/dL (ref 0.50–1.10)
GFR calc Af Amer: >90 mL/min (ref >90)
GFR calc non Af Amer: >90 mL/min (ref >90)
Glucose, Bld: 88 mg/dL (ref 70–99)

## 2012-12-10 LAB — WET PREP, GENITAL: Clue Cells Wet Prep HPF POC: NONE SEEN

## 2012-12-10 MED ORDER — NITROFURANTOIN MONOHYD MACRO 100 MG PO CAPS: 100.0000 mg | ORAL_CAPSULE | Freq: Two times a day (BID) | ORAL | Status: DC

## 2012-12-10 MED ORDER — TRAMADOL HCL 50 MG PO TABS: 50.0000 mg | ORAL_TABLET | Freq: Four times a day (QID) | ORAL | Status: DC | PRN

## 2012-12-10 MED ORDER — ONDANSETRON HCL 4 MG/2ML IJ SOLN
4.0000 mg | Freq: Once | INTRAMUSCULAR | Status: AC
  Administered 2012-12-10: 4 mg via INTRAVENOUS
  Filled 2012-12-10: qty 2

## 2012-12-10 MED ORDER — MORPHINE SULFATE 4 MG/ML IJ SOLN
4.0000 mg | Freq: Once | INTRAMUSCULAR | Status: AC
  Administered 2012-12-10: 4 mg via INTRAVENOUS
  Filled 2012-12-10: qty 1

## 2012-12-10 MED ORDER — PROMETHAZINE HCL 25 MG PO TABS: 25.0000 mg | ORAL_TABLET | Freq: Four times a day (QID) | ORAL | Status: DC | PRN

## 2012-12-10 NOTE — ED Notes (Signed)
Pt having LLQ pain since Saturday, denies any n/v/d.

## 2012-12-10 NOTE — ED Provider Notes (Signed)
CSN: [id number]     Arrival date & time 12/10/12  1029 History   First MD Initiated Contact with Patient 12/10/12 1114     Chief Complaint  Patient presents with  . Abdominal Pain   (Consider location/radiation/quality/duration/timing/severity/associated sxs/prior Treatment) HPI Comments: Patient presents today with a chief complaint of abdominal pain.  Pain present in the LLQ.  She reports that the pain has been present intermittently for the past 3 days.  She reports that the pain does not radiate.  She states that she has been diagnosed with a left ovarian cyst in the past and that the pain today feels similar.  She has taken Tylenol for the pain with mild relief.  She reports associated nausea, but no vomiting or diarrhea.  She denies vaginal discharge.  Denies urinary symptoms.  Denies fever or chills.  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  She gave birth 6 months ago.  She states that she was followed at [facility] at that time.  She reports that she is currently breast feeding and has not had a menstrual period since giving birth to her child.  The history is provided by the patient. The history is limited by a language barrier. A language interpreter was used Oncologist phone used).    Past Medical History  Diagnosis Date  . Tachycardia 2014  . Medical history non-contributory    Past Surgical History  Procedure Laterality Date  . Cesarean section      2003 (1st preg in [place])   Family History  Problem Relation Age of Onset  . Alcohol abuse Neg Hx   . Arthritis Neg Hx   . Asthma Neg Hx   . Birth defects Neg Hx   . Cancer Neg Hx   . COPD Neg Hx   . Depression Neg Hx   . Diabetes Neg Hx   . Drug abuse Neg Hx   . Early death Neg Hx   . Hearing loss Neg Hx   . Heart disease Neg Hx   . Hypertension Neg Hx   . Hyperlipidemia Neg Hx   . Kidney disease Neg Hx   . Learning disabilities Neg Hx   . Mental illness Neg Hx   . Mental  retardation Neg Hx   . Miscarriages / Stillbirths Neg Hx   . Stroke Neg Hx   . Vision loss Neg Hx    History  Substance Use Topics  . Smoking status: Never Smoker   . Smokeless tobacco: Never Used  . Alcohol Use: No   OB History   Grav Para Term Preterm Abortions TAB SAB Ect Mult Living   5 5 4 1  0 0 0 0 0 5     Review of Systems  Genitourinary: Positive for pelvic pain.  All other systems reviewed and are negative.    Allergies  Review of patient's allergies indicates no known allergies.  Home Medications   Current Outpatient Rx  Name  Route  Sig  Dispense  Refill  . docusate sodium (COLACE) 100 MG capsule   Oral   Take 1 capsule (100 mg total) by mouth 2 (two) times daily.   60 capsule   2   . hydrOXYzine (ATARAX/VISTARIL) 50 MG tablet   Oral   Take 1 tablet (50 mg total) by mouth every 6 (six) hours as needed for anxiety.   20 tablet   0   . ibuprofen (ADVIL,MOTRIN) 200 MG tablet   Oral  Take 400 mg by mouth every 6 (six) hours as needed for pain.         Marland Kitchen norethindrone (MICRONOR,CAMILA,ERRIN) 0.35 MG tablet   Oral   Take 1 tablet (0.35 mg total) by mouth daily.   1 Package   11   . pramoxine (PROCTOFOAM) 1 % foam   Rectal   Place rectally every 2 (two) hours as needed for hemorrhoids.   15 g   0   . Prenatal Vit-Fe Fumarate-FA (MULTIVITAMIN-PRENATAL) 27-0.8 MG TABS   Oral   Take 1 tablet by mouth daily at 12 noon.          BP 110/50  Pulse 75  Temp(Src) 98.2 F (36.8 C) (Oral)  Resp 18  Ht 5' (1.524 m)  Wt 137 lb 6.4 oz (62.324 kg)  BMI 26.83 kg/m2  SpO2 100% Physical Exam  Nursing note and vitals reviewed. Constitutional: She appears well-developed and well-nourished.  HENT:  Head: Normocephalic and atraumatic.  Mouth/Throat: Oropharynx is clear and moist.  Neck: Normal range of motion. Neck supple.  Cardiovascular: Normal rate, regular rhythm and normal heart sounds.   Pulmonary/Chest: Effort normal and breath sounds normal.   Abdominal: Soft. Normal appearance and bowel sounds are normal. She exhibits no distension and no mass. There is tenderness in the left lower quadrant. There is no rebound and no guarding.  Genitourinary: Cervix exhibits no motion tenderness. Right adnexum displays no mass, no tenderness and no fullness. Left adnexum displays tenderness. Left adnexum displays no mass and no fullness.  Neurological: She is alert.  Skin: Skin is warm and dry.  Psychiatric: She has a normal mood and affect.    ED Course  Procedures (including critical care time) Labs Review Labs Reviewed  GC/CHLAMYDIA PROBE AMP  WET PREP, GENITAL  COMPREHENSIVE METABOLIC PANEL  CBC WITH DIFFERENTIAL  URINALYSIS, ROUTINE W REFLEX MICROSCOPIC   Imaging Review US Transvaginal Non-ob  12/10/2012   CLINICAL DATA:  Left lower quadrant pelvic pain, amenorrhea since delivery 6 months ago  EXAM: TRANSABDOMINAL AND TRANSVAGINAL ULTRASOUND OF PELVIS  DOPPLER ULTRASOUND OF OVARIES  TECHNIQUE: Both transabdominal and transvaginal ultrasound examinations of the pelvis were performed. Transabdominal technique was performed for global imaging of the pelvis including uterus, ovaries, adnexal regions, and pelvic cul-de-sac.  It was necessary to proceed with endovaginal exam following the transabdominal exam to visualize the endometrium and ovaries. Color and duplex Doppler ultrasound was utilized to evaluate blood flow to the ovaries.  COMPARISON:  None.  FINDINGS: Uterus  Measurements: 6.6 x 4.5 x 3.5 cm. No fibroids or other mass visualized. Anteverted, mildly retroflexed, which renders visualization of the fundus mildly suboptimal.  Endometrium  Thickness: 1.3 cm. Uniformly echogenic. No focal abnormality visualized.  Right ovary  Measurements: 3.4 x 2.6 x 2.5 cm. Normal appearance/no adnexal mass.  Left ovary  Measurements: 3.6 x 3.6 x 2.3 cm. Normal appearance/no adnexal mass.  Both ovaries are prominent in size with multiple follicles which  may be associated with polycystic ovarian syndrome in the appropriate clinical context.  Pulsed Doppler evaluation of both ovaries demonstrates normal low-resistance arterial and venous waveforms.  Other findings  No free fluid.  IMPRESSION: No acute abnormality.  No sonographic evidence for ovarian torsion.   Electronically Signed   By: Christiana Pellant M.D.   On: 12/10/2012 14:14   US Pelvis Complete  12/10/2012   CLINICAL DATA:  Left lower quadrant pelvic pain, amenorrhea since delivery 6 months ago  EXAM: TRANSABDOMINAL AND TRANSVAGINAL ULTRASOUND OF  PELVIS  DOPPLER ULTRASOUND OF OVARIES  TECHNIQUE: Both transabdominal and transvaginal ultrasound examinations of the pelvis were performed. Transabdominal technique was performed for global imaging of the pelvis including uterus, ovaries, adnexal regions, and pelvic cul-de-sac.  It was necessary to proceed with endovaginal exam following the transabdominal exam to visualize the endometrium and ovaries. Color and duplex Doppler ultrasound was utilized to evaluate blood flow to the ovaries.  COMPARISON:  None.  FINDINGS: Uterus  Measurements: 6.6 x 4.5 x 3.5 cm. No fibroids or other mass visualized. Anteverted, mildly retroflexed, which renders visualization of the fundus mildly suboptimal.  Endometrium  Thickness: 1.3 cm. Uniformly echogenic. No focal abnormality visualized.  Right ovary  Measurements: 3.4 x 2.6 x 2.5 cm. Normal appearance/no adnexal mass.  Left ovary  Measurements: 3.6 x 3.6 x 2.3 cm. Normal appearance/no adnexal mass.  Both ovaries are prominent in size with multiple follicles which may be associated with polycystic ovarian syndrome in the appropriate clinical context.  Pulsed Doppler evaluation of both ovaries demonstrates normal low-resistance arterial and venous waveforms.  Other findings  No free fluid.  IMPRESSION: No acute abnormality.  No sonographic evidence for ovarian torsion.   Electronically Signed   By: Christiana Pellant M.D.   On:  12/10/2012 14:14   [place] Art/ven Flow Abd Pelv Doppler  12/10/2012   CLINICAL DATA:  Left lower quadrant pelvic pain, amenorrhea since delivery 6 months ago  EXAM: TRANSABDOMINAL AND TRANSVAGINAL ULTRASOUND OF PELVIS  DOPPLER ULTRASOUND OF OVARIES  TECHNIQUE: Both transabdominal and transvaginal ultrasound examinations of the pelvis were performed. Transabdominal technique was performed for global imaging of the pelvis including uterus, ovaries, adnexal regions, and pelvic cul-de-sac.  It was necessary to proceed with endovaginal exam following the transabdominal exam to visualize the endometrium and ovaries. Color and duplex Doppler ultrasound was utilized to evaluate blood flow to the ovaries.  COMPARISON:  None.  FINDINGS: Uterus  Measurements: 6.6 x 4.5 x 3.5 cm. No fibroids or other mass visualized. Anteverted, mildly retroflexed, which renders visualization of the fundus mildly suboptimal.  Endometrium  Thickness: 1.3 cm. Uniformly echogenic. No focal abnormality visualized.  Right ovary  Measurements: 3.4 x 2.6 x 2.5 cm. Normal appearance/no adnexal mass.  Left ovary  Measurements: 3.6 x 3.6 x 2.3 cm. Normal appearance/no adnexal mass.  Both ovaries are prominent in size with multiple follicles which may be associated with polycystic ovarian syndrome in the appropriate clinical context.  Pulsed Doppler evaluation of both ovaries demonstrates normal low-resistance arterial and venous waveforms.  Other findings  No free fluid.  IMPRESSION: No acute abnormality.  No sonographic evidence for ovarian torsion.   Electronically Signed   By: Christiana Pellant M.D.   On: 12/10/2012 14:14    EKG Interpretation   None      Reassessed patient.  She reports that her pain is controlled at this time.  Discussed results and discharge instructions with the patient using the interpretor phone. MDM  No diagnosis found. Patient presenting with left sided pelvic pain.  No rebound or guarding on abdominal exam.  Patient  is afebrile.  No CMT on pelvic exam.  Pain treated and improved while in the ED.  Labs unremarkable.  UA showing a UTI.  Urine cultured and patient discharged home on Macrobid.  Pelvic ultrasound unremarkable.  Patient stable for discharge.  Return precautions given.    Santiago Glad, PA-C 12/10/12 1626

## 2012-12-10 NOTE — ED Provider Notes (Signed)
Medical screening examination/treatment/procedure(s) were performed by non-physician practitioner and as supervising physician I was immediately available for consultation/collaboration.  EKG Interpretation   None         Kiernan Atkerson L Nadir Vasques, MD 12/10/12 1644 

## 2012-12-11 LAB — URINE CULTURE: Colony Count: 2000

## 2012-12-11 LAB — GC/CHLAMYDIA PROBE AMP
CT Probe RNA: NEGATIVE
GC Probe RNA: NEGATIVE

## 2013-01-29 ENCOUNTER — Encounter: Payer: Self-pay | Admitting: *Deleted

## 2013-02-11 ENCOUNTER — Encounter (HOSPITAL_COMMUNITY): Payer: Self-pay | Admitting: Emergency Medicine

## 2013-02-11 DIAGNOSIS — R1032 Left lower quadrant pain: Secondary | ICD-10-CM | POA: Insufficient documentation

## 2013-02-11 LAB — COMPREHENSIVE METABOLIC PANEL
ALK PHOS: 100 U/L (ref 39–117)
ALT: 22 U/L (ref 0–35)
AST: 26 U/L (ref 0–37)
Albumin: 4.3 g/dL (ref 3.5–5.2)
BUN: 12 mg/dL (ref 6–23)
CO2: 26 mEq/L (ref 19–32)
CREATININE: 0.68 mg/dL (ref 0.50–1.10)
Calcium: 9.4 mg/dL (ref 8.4–10.5)
Chloride: 103 mEq/L (ref 96–112)
GFR calc Af Amer: >90 mL/min (ref >90)
Glucose, Bld: 93 mg/dL (ref 70–99)
Potassium: 4.2 mEq/L (ref 3.7–5.3)
Sodium: 142 mEq/L (ref 137–147)
Total Bilirubin: 0.3 mg/dL (ref 0.3–1.2)
Total Protein: 7.6 g/dL (ref 6.0–8.3)

## 2013-02-11 LAB — CBC WITH DIFFERENTIAL/PLATELET
Basophils Absolute: 0 10*3/uL (ref 0.0–0.1)
Basophils Relative: 0 % (ref 0–1)
Eosinophils Absolute: 0.3 10*3/uL (ref 0.0–0.7)
Eosinophils Relative: 3 % (ref 0–5)
HCT: 40.2 % (ref 36.0–46.0)
HEMOGLOBIN: 13.9 g/dL (ref 12.0–15.0)
LYMPHS ABS: 1.4 10*3/uL (ref 0.7–4.0)
Lymphocytes Relative: 19 % (ref 12–46)
MCH: 30 pg (ref 26.0–34.0)
MCHC: 34.6 g/dL (ref 30.0–36.0)
MCV: 86.6 fL (ref 78.0–100.0)
MONO ABS: 0.5 10*3/uL (ref 0.1–1.0)
MONOS PCT: 7 % (ref 3–12)
NEUTROS ABS: 5.2 10*3/uL (ref 1.7–7.7)
Neutrophils Relative %: 70 % (ref 43–77)
Platelets: 192 10*3/uL (ref 150–400)
RBC: 4.64 MIL/uL (ref 3.87–5.11)
RDW: 13.2 % (ref 11.5–15.5)
WBC: 7.4 10*3/uL (ref 4.0–10.5)

## 2013-02-11 LAB — URINALYSIS, ROUTINE W REFLEX MICROSCOPIC
Bilirubin Urine: NEGATIVE
Glucose, UA: NEGATIVE mg/dL
HGB URINE DIPSTICK: NEGATIVE
KETONES UR: NEGATIVE mg/dL
Leukocytes, UA: NEGATIVE
Nitrite: NEGATIVE
PROTEIN: NEGATIVE mg/dL
Specific Gravity, Urine: 1.024 (ref 1.005–1.030)
Urobilinogen, UA: 0.2 mg/dL (ref 0.0–1.0)
pH: 5.5 (ref 5.0–8.0)

## 2013-02-11 LAB — POCT PREGNANCY, URINE: PREG TEST UR: NEGATIVE

## 2013-02-11 NOTE — ED Notes (Signed)
Reports lower left side pain that started this am 0230. Denies any n/v/d or urinary symptoms.

## 2013-02-12 ENCOUNTER — Emergency Department (HOSPITAL_COMMUNITY)
Admission: EM | Admit: 2013-02-12 | Discharge: 2013-02-12 | Discharge status: Against Medical Advice | Payer: Self-pay | Attending: Emergency Medicine | Admitting: Emergency Medicine

## 2013-03-11 ENCOUNTER — Encounter (HOSPITAL_COMMUNITY): Payer: Self-pay | Admitting: Emergency Medicine

## 2013-03-11 ENCOUNTER — Emergency Department (HOSPITAL_COMMUNITY)
Admission: EM | Admit: 2013-03-11 | Discharge: 2013-03-11 | Disposition: A | Discharge status: Home / Self Care | Payer: Self-pay | Attending: Emergency Medicine | Admitting: Emergency Medicine

## 2013-03-11 ENCOUNTER — Emergency Department (HOSPITAL_COMMUNITY): Payer: Self-pay

## 2013-03-11 DIAGNOSIS — N949 Unspecified condition associated with female genital organs and menstrual cycle: Secondary | ICD-10-CM | POA: Insufficient documentation

## 2013-03-11 DIAGNOSIS — N939 Abnormal uterine and vaginal bleeding, unspecified: Secondary | ICD-10-CM

## 2013-03-11 DIAGNOSIS — R1032 Left lower quadrant pain: Secondary | ICD-10-CM | POA: Insufficient documentation

## 2013-03-11 DIAGNOSIS — R109 Unspecified abdominal pain: Secondary | ICD-10-CM

## 2013-03-11 DIAGNOSIS — Z3202 Encounter for pregnancy test, result negative: Secondary | ICD-10-CM | POA: Insufficient documentation

## 2013-03-11 DIAGNOSIS — N898 Other specified noninflammatory disorders of vagina: Secondary | ICD-10-CM | POA: Insufficient documentation

## 2013-03-11 LAB — URINALYSIS, ROUTINE W REFLEX MICROSCOPIC
BILIRUBIN URINE: NEGATIVE
Glucose, UA: NEGATIVE mg/dL
KETONES UR: NEGATIVE mg/dL
NITRITE: NEGATIVE
PH: 7 (ref 5.0–8.0)
Protein, ur: NEGATIVE mg/dL
Specific Gravity, Urine: 1.026 (ref 1.005–1.030)
Urobilinogen, UA: 1 mg/dL (ref 0.0–1.0)

## 2013-03-11 LAB — WET PREP, GENITAL
CLUE CELLS WET PREP: NONE SEEN
Trich, Wet Prep: NONE SEEN
Yeast Wet Prep HPF POC: NONE SEEN

## 2013-03-11 LAB — URINE MICROSCOPIC-ADD ON

## 2013-03-11 LAB — POCT PREGNANCY, URINE: PREG TEST UR: NEGATIVE

## 2013-03-11 MED ORDER — HYDROCODONE-ACETAMINOPHEN 5-325 MG PO TABS: 1.0000 | ORAL_TABLET | ORAL | Status: DC | PRN

## 2013-03-11 MED ORDER — OXYCODONE-ACETAMINOPHEN 5-325 MG PO TABS
2.0000 | ORAL_TABLET | Freq: Once | ORAL | Status: AC
Start: 2013-03-11 — End: 2013-03-11
  Administered 2013-03-11: 2 via ORAL
  Filled 2013-03-11: qty 2

## 2013-03-11 MED ORDER — PROMETHAZINE HCL 25 MG PO TABS: 25.0000 mg | ORAL_TABLET | Freq: Four times a day (QID) | ORAL | Status: DC | PRN

## 2013-03-11 MED ORDER — ONDANSETRON 4 MG PO TBDP
8.0000 mg | ORAL_TABLET | Freq: Once | ORAL | Status: AC
  Administered 2013-03-11: 8 mg via ORAL
  Filled 2013-03-11: qty 2

## 2013-03-11 NOTE — ED Notes (Signed)
PA at bedside.

## 2013-03-11 NOTE — ED Notes (Signed)
Pt A&Ox4, ambulatory at discharge, smiling, verbalizing no complaints.

## 2013-03-11 NOTE — Discharge Instructions (Signed)
Dolor abdominal en las mujeres  (Abdominal Pain, Women)  El dolor abdominal (en el estómago, la pelvis o el vientre) puede tener muchas causas. Es importante que le informe a su médico:  · La ubicación del dolor.  · ¿Viene y se va, o persiste todo el tiempo?  · ¿Hay situaciones que inician el dolor (comer ciertos alimentos, la actividad física)?  · ¿Tiene otros síntomas asociados al dolor (fiebre, náuseas, vómitos, diarrea)?  Todo es de gran ayuda cuando se trata de hallar la causa del dolor.  CAUSAS  · Estómago: Infecciones por virus o bacterias, o úlcera.  · Intestino: Apendicitis (apéndice inflamado), ileitis regional (enfermedad de Crohn), colitis ulcerosa (colon inflamado), síndrome del colon irritable, diverticulitis (inflamación de los divertículos del colon) o cáncer de estómago oo intestino.  · Enfermedades de la vesícula biliar o cálculos.  · Enfermedades renales, cálculos o infecciones en el riñón.  · Infección o cáncer del páncreas.  · Fibromialgia (trastorno doloroso)  · Enfermedades de los órganos femeninos:  · Uterus: Útero: fibroma (tumor no canceroso) o infección  · Trompas de Falopio: infección o embarazo ectópico  · En los ovarios, quistes o tumores.  · Adherencias pélvicas (tejido cicatrizal).  · Endometriosis (el tejido que cubre el útero se desarrolla en la pelvis y los órganos pélvicos).  · Síndrome de congestión pélvica (los órganos femeninos se llenan de sangre antes del periodo menstrual(  · Dolor durante el periodo menstrual.  · Dolor durante la ovulación (al producir óvulos).  · Dolor al usar el DIU (dispositivo intrauterino para el control de la natalidad)  · Cáncer en los órganos femeninos.  · Dolor funcional (no está originado en una enfermedad, puede mejorar sin tratamiento).  · Dolor de origen psicológico  · Depresión.  DIAGNÓSTICO  Su médico decidirá la gravedad del dolor a través del examen físico  · Análisis de sangre  · Radiografías  · Ecografías  · TC (tomografía computada, tipo  especial de radiografías).  · IMR (resonancia magnética)  · Cultivos, en el caso una infección  · Colon por enema de bario (se inserta una sustancia de contraste en el intestino grueso para mejorar la observación con rayos X.)  · Colonoscopía (observación del intestino con un tubo luminoso).  · Laparoscopía (examen del interior del abdomen con un tubo que tiene una luz).  · Cirugía exploratoria abdominal mayor (se observa el abdomen realizando una gran incisión).  TRATAMIENTO  El tratamiento dependerá de la causa del problema.   · Muchos de estos casos pueden controlarse y tratarse en casa.  · Medicamentos de venta libre indicados por el médico.  · Medicamentos con receta.  · Antibióticos, en caso de infección  · Píldoras anticonceptivas, en el caso de períodos dolorosos o dolor al ovular.  · Tratamiento hormonal, para la endometriosis  · Inyecciones para bloqueo nervioso selectivo.  · Fisioterapia.  · Antidepresivos.  · Consejos por parte de un psícólogo o psiquiatra.  · Cirugía mayor o menor.  INSTRUCCIONES PARA EL CUIDADO DOMICILIARIO  · No tome ni administre laxantes a menos que se lo haya indicado su médico.  · Tome analgésicos de venta libre sólo si se lo ha indicado el profesional que lo asiste. No tome aspirina, ya que puede causar molestias en el estómago o hemorragias.  · Consuma una dieta líquida (caldo o agua) según lo indicado por el médico. Progrese lentamente a una dieta blanda, según la tolerancia, si el dolor se relaciona con el estómago o el intestino.  ·   no se BJ'salivia con los medicamentos o la [place], [place]uede tratar con:  Acupuntura.  Ejercicios de relajacin (yoga,  meditacin).  Terapia grupal.  Psicoterapia. SOLICITE ATENCIN MDICA SI:  Nota que ciertos Pharmacist, communityalimentos le producen dolor de Miller Colonyestmago.  El tratamiento indicado para Arboriculturistrealizar en el hogar no Marketing executivele alivia el dolor.  Necesita analgsicos ms fuertes.  Quiere que le retiren el DIU.  Si se siente confundido o desfalleciente.  Presenta nuseas o vmitos.  Aparece una erupcin cutnea.  Sufre efectos adversos o una reaccin alrgica debido a los medicamentos que toma. SOLICITE ATENCIN MDICA DE INMEDIATO SI:  El dolor persiste o se agrava.  Tiene fiebre.  Siente el dolor slo en algunos sectores del abdomen. Si se localiza en la zona derecha, posiblemente podra tratarse de apendicitis. En un adulto, si se localiza en la regin inferior izquierda del abdomen, podra tratarse de colitis o diverticulitis.  Hay sangre en las heces (deposiciones de color rojo brillante o negro alquitranado), con o sin vmitos.  Usted presenta sangre en la orina.  Siente escalofros con o sin fiebre.  Se desmaya. ASEGRESE QUE:   Comprende estas instrucciones.  Controlar su enfermedad.  Solicitar ayuda de inmediato si no mejora o si empeora. Document Released: 04/27/2008 Document Revised: 04/03/2011 [facility]Exit[facility] Patient Information 2014 [place]Care, [place].  Sangrado uterino anormal (Abnormal Uterine Bleeding) Sangrado uterino anormal significa que hay un sangrado por la vagina que no es su perodo menstrual normal. Puede ser:  Prdidas de sangre o hemorragias entre los perodos.  Hemorragias luego de Warehouse managertener sexo [employer](relaciones sexuales).  Sangrado abundante o ms que lo habitual.  Perodos que duran ms que lo normal.  Sangrado luego de la menopausia. Hay muchos problemas que pueden ser la causa. El tratamiento depender de la causa del sangrado. Cualquier tipo de sangrado que no sea normal debe consultarse con el mdico.  CUIDADOS EN EL HOGAR Controle su afeccin para ver si hay cambios. Estas  indicaciones podrn disminuir cualquier molestia que tenga:  No use tampones ni duchas vaginales o como le haya indicado el mdico.  Cambie los apsitos con frecuencia. Deber hacerse exmenes plvicos regulares y pruebas de Papanicolaou. Realice los estudios indicados segn le indique su mdico. SOLICITE AYUDA SI:  El sangrado dura ms de 1 semana.  Se siente mareada por momentos. SOLICITE AYUDA DE INMEDIATO SI:   Se desmaya.  Tiene que General Millscambiarse los apsitos cada 15 a 30 minutos.  Siente dolor en el abdomen.  Tiene fiebre.  Se siente dbil o presenta sudoracin.  Elimina cogulos grandes por la vagina.  Siente Programme researcher, broadcasting/film/videomalestar estomacal (nuseas) y devuelve (vomita). ASEGRESE DE QUE:  Comprende estas instrucciones.  Controlar su afeccin.  Recibir ayuda de inmediato si no mejora o si empeora. Document Released: 02/11/2010 Document Revised: 10/30/2012 [facility] ServicesExitCare Patient Information 2014 [place]ExitCare, [place]LLC.

## 2013-03-11 NOTE — ED Provider Notes (Signed)
CSN: [id number]     Arrival date & time 03/11/13  1431 History   First MD Initiated Contact with Patient 03/11/13 1506     Chief Complaint  Patient presents with  . Abdominal Pain     (Consider location/radiation/quality/duration/timing/severity/associated sxs/prior Treatment) HPI Comments: Patient is a 33 year old female with history of tachycardia who presents today with 3 days of gradually worsening left lower quadrant abdominal pain and vaginal bleeding. Last menstrual period was 2/7. She reports that the pain goes from "her kidneys to her ovaries". She has history of pain like this in the past. She was diagnosed with an ovarian cyst at that time. This pain is similar, but worse than her cyst. She denies any history of kidney stones. She denies dysuria, hematuria, urgency, frequency. She has not done anything to improve her pain. Nothing seems to make her pain worse. No fevers, chills, nausea, vomiting, diarrhea, shortness of breath.   Patient is a 33 y.o. female presenting with abdominal pain. The history is provided by the patient. The history is limited by a language barrier. A language interpreter was used.  Abdominal Pain Associated symptoms: vaginal bleeding   Associated symptoms: no chills, no constipation, no diarrhea, no dysuria, no fever, no hematuria, no nausea, no shortness of breath, no vaginal discharge and no vomiting     Past Medical History  Diagnosis Date  . Tachycardia 2014  . Medical history non-contributory    Past Surgical History  Procedure Laterality Date  . Cesarean section      2003 (1st preg in [place])   Family History  Problem Relation Age of Onset  . Alcohol abuse Neg Hx   . Arthritis Neg Hx   . Asthma Neg Hx   . Birth defects Neg Hx   . Cancer Neg Hx   . COPD Neg Hx   . Depression Neg Hx   . Diabetes Neg Hx   . Drug abuse Neg Hx   . Early death Neg Hx   . Hearing loss Neg Hx   . Heart disease Neg Hx   . Hypertension Neg Hx   .  Hyperlipidemia Neg Hx   . Kidney disease Neg Hx   . Learning disabilities Neg Hx   . Mental illness Neg Hx   . Mental retardation Neg Hx   . Miscarriages / Stillbirths Neg Hx   . Stroke Neg Hx   . Vision loss Neg Hx    History  Substance Use Topics  . Smoking status: Never Smoker   . Smokeless tobacco: Never Used  . Alcohol Use: No   OB History   Grav Para Term Preterm Abortions TAB SAB Ect Mult Living   5 5 4 1  0 0 0 0 0 5     Review of Systems  Constitutional: Negative for fever and chills.  Respiratory: Negative for shortness of breath.   Gastrointestinal: Positive for abdominal pain. Negative for nausea, vomiting, diarrhea and constipation.  Genitourinary: Positive for vaginal bleeding and pelvic pain. Negative for dysuria, frequency, hematuria and vaginal discharge.  All other systems reviewed and are negative.      Allergies  Review of patient's allergies indicates no known allergies.  Home Medications  No current outpatient prescriptions on file. BP 107/63  Pulse 72  Temp(Src) 98.2 F (36.8 C)  Resp 14  SpO2 100%  LMP 01/23/2013 Physical Exam  Nursing note and vitals reviewed. Constitutional: She is oriented to person, place, and time. She appears well-developed and well-nourished. She does  not appear ill. No distress.  Well appearing  HENT:  Head: Normocephalic and atraumatic.  Right Ear: External ear normal.  Left Ear: External ear normal.  Nose: Nose normal.  Mouth/Throat: Oropharynx is clear and moist.  Eyes: Conjunctivae and EOM are normal.  Neck: Normal range of motion.  No nuchal rigidity or meningeal signs  Cardiovascular: Normal rate, regular rhythm, normal heart sounds, intact distal pulses and normal pulses.   Pulmonary/Chest: Effort normal and breath sounds normal. No stridor. No respiratory distress. She has no wheezes. She has no rales.  Abdominal: Soft. Bowel sounds are normal. She exhibits no distension. There is tenderness in the left  lower quadrant. There is no rigidity, no rebound and no guarding.  Genitourinary: There is no rash, tenderness, lesion or injury on the right labia. There is no rash, tenderness, lesion or injury on the left labia. Cervix exhibits discharge (small amount of blood coming from cervical os). Cervix exhibits no motion tenderness and no friability. Right adnexum displays no mass, no tenderness and no fullness. Left adnexum displays tenderness (mild). Left adnexum displays no mass and no fullness. No erythema around the vagina. No foreign body around the vagina. No signs of injury around the vagina. No vaginal discharge found.  Small amount of blood coming from cervical os. No CMT or friability of the cervix. No significant tenderness on exam.   Musculoskeletal: Normal range of motion.  Neurological: She is alert and oriented to person, place, and time. She has normal strength.  Skin: Skin is warm and dry. She is not diaphoretic. No erythema.  Psychiatric: She has a normal mood and affect. Her behavior is normal.    ED Course  Procedures (including critical care time) Labs Review Labs Reviewed  WET PREP, GENITAL - Abnormal; Notable for the following:    WBC, Wet Prep HPF POC FEW (*)    All other components within normal limits  URINALYSIS, ROUTINE W REFLEX MICROSCOPIC - Abnormal; Notable for the following:    APPearance HAZY (*)    Hgb urine dipstick LARGE (*)    Leukocytes, UA SMALL (*)    All other components within normal limits  GC/CHLAMYDIA PROBE AMP  URINE MICROSCOPIC-ADD ON  POCT PREGNANCY, URINE   Imaging Review Ct Abdomen Pelvis Wo Contrast  03/11/2013   CLINICAL DATA:  Left-sided pain.  EXAM: CT ABDOMEN AND PELVIS WITHOUT CONTRAST  TECHNIQUE: Multidetector CT imaging of the abdomen and pelvis was performed following the standard protocol without intravenous contrast.  COMPARISON:  None.  FINDINGS: Liver normal. Spleen normal. Pancreas normal. Gallbladder is nondistended.  Adrenals  normal. No hydronephrosis or obstructing ureteral stone. The bladder is nondistended. Uterus and adnexa are unremarkable. No free pelvic fluid.  No significant adenopathy.  Abdominal aorta normal in caliber.  Appendix is normal. No inflammatory changes in of the right or left lower quadrant. Stool is noted throughout the colon. There is no bowel distention. Stomach is nondistended. No mesenteric masses are noted.  No significant bony abnormality. Bone island right acetabulum. Heart size normal. Lung bases are clear. Small umbilical hernia. No significant bowel herniation.  IMPRESSION: No significant abnormality.   Electronically Signed   By: Maisie Fushomas  Register   On: 03/11/2013 19:17   [place] Transvaginal Non-ob  03/11/2013   CLINICAL DATA:  Left lower quadrant pain. Rule left ovarian portions.  EXAM: TRANSABDOMINAL AND TRANSVAGINAL ULTRASOUND OF PELVIS  DOPPLER ULTRASOUND OF OVARIES  TECHNIQUE: Both transabdominal and transvaginal ultrasound examinations of the pelvis were performed. Transabdominal technique  was performed for global imaging of the pelvis including uterus, ovaries, adnexal regions, and pelvic cul-de-sac.  It was necessary to proceed with endovaginal exam following the transabdominal exam to visualize the endometrium, ovaries and uterus. Color and duplex Doppler ultrasound was utilized to evaluate blood flow to the ovaries.  COMPARISON:  12/10/2012  FINDINGS: Uterus  Measurements: 7.3 x 4.0 x 4.3 cm. No fibroids or other mass visualized.  Endometrium  Thickness: 8.4 mm.  No focal abnormality visualized.  Right ovary  Measurements: 3.3 x 2.1 x 1.9 cm. Normal appearance/no adnexal mass.  Left ovary  Measurements: 3.6 x 2.5 x 2.3 cm. Normal appearance/no adnexal mass.  Pulsed Doppler evaluation of both ovaries demonstrates normal low-resistance arterial and venous waveforms.  Other findings  Both ovaries are prominent in size with multiple follicles which may be associated with polycystic ovarian syndrome  in the appropriate clinical context.  IMPRESSION: 1. No acute abnormality. No sonographic evidence for ovarian torsion.   Electronically Signed   By: Signa Kell M.D.   On: 03/11/2013 16:57   US Pelvis Complete  03/11/2013   CLINICAL DATA:  Left lower quadrant pain. Rule left ovarian portions.  EXAM: TRANSABDOMINAL AND TRANSVAGINAL ULTRASOUND OF PELVIS  DOPPLER ULTRASOUND OF OVARIES  TECHNIQUE: Both transabdominal and transvaginal ultrasound examinations of the pelvis were performed. Transabdominal technique was performed for global imaging of the pelvis including uterus, ovaries, adnexal regions, and pelvic cul-de-sac.  It was necessary to proceed with endovaginal exam following the transabdominal exam to visualize the endometrium, ovaries and uterus. Color and duplex Doppler ultrasound was utilized to evaluate blood flow to the ovaries.  COMPARISON:  12/10/2012  FINDINGS: Uterus  Measurements: 7.3 x 4.0 x 4.3 cm. No fibroids or other mass visualized.  Endometrium  Thickness: 8.4 mm.  No focal abnormality visualized.  Right ovary  Measurements: 3.3 x 2.1 x 1.9 cm. Normal appearance/no adnexal mass.  Left ovary  Measurements: 3.6 x 2.5 x 2.3 cm. Normal appearance/no adnexal mass.  Pulsed Doppler evaluation of both ovaries demonstrates normal low-resistance arterial and venous waveforms.  Other findings  Both ovaries are prominent in size with multiple follicles which may be associated with polycystic ovarian syndrome in the appropriate clinical context.  IMPRESSION: 1. No acute abnormality. No sonographic evidence for ovarian torsion.   Electronically Signed   By: Signa Kell M.D.   On: 03/11/2013 16:57   [place] Art/ven Flow Abd Pelv Doppler  03/11/2013   CLINICAL DATA:  Left lower quadrant pain. Rule left ovarian portions.  EXAM: TRANSABDOMINAL AND TRANSVAGINAL ULTRASOUND OF PELVIS  DOPPLER ULTRASOUND OF OVARIES  TECHNIQUE: Both transabdominal and transvaginal ultrasound examinations of the pelvis were  performed. Transabdominal technique was performed for global imaging of the pelvis including uterus, ovaries, adnexal regions, and pelvic cul-de-sac.  It was necessary to proceed with endovaginal exam following the transabdominal exam to visualize the endometrium, ovaries and uterus. Color and duplex Doppler ultrasound was utilized to evaluate blood flow to the ovaries.  COMPARISON:  12/10/2012  FINDINGS: Uterus  Measurements: 7.3 x 4.0 x 4.3 cm. No fibroids or other mass visualized.  Endometrium  Thickness: 8.4 mm.  No focal abnormality visualized.  Right ovary  Measurements: 3.3 x 2.1 x 1.9 cm. Normal appearance/no adnexal mass.  Left ovary  Measurements: 3.6 x 2.5 x 2.3 cm. Normal appearance/no adnexal mass.  Pulsed Doppler evaluation of both ovaries demonstrates normal low-resistance arterial and venous waveforms.  Other findings  Both ovaries are prominent in size with multiple follicles  which may be associated with polycystic ovarian syndrome in the appropriate clinical context.  IMPRESSION: 1. No acute abnormality. No sonographic evidence for ovarian torsion.   Electronically Signed   By: Signa Kell M.D.   On: 03/11/2013 16:57    EKG Interpretation   None       MDM   Final diagnoses:  Vaginal bleeding  Abdominal pain    Pt presents to ED with vaginal bleeding and LLQ abd pain similar to prior ovarian cyst. Pelvic US done which shows no acute abnormality. No concern for torsion. Pelvic exam benign. Small amount of vaginal bleeding coming from cervical os. No CMT. No concern for PID. CT scan done to r/o KS given flank like nature of pain. This study shows no significant abnormality. Patient feels improved prior to discharge. Encouraged to follow up with GYN.  Return instructions given. Vital signs stable for discharge. Patient / Family / Caregiver informed of clinical course, understand medical decision-making process, and agree with plan.     Mora Bellman, PA-C 03/12/13 [phone]

## 2013-03-11 NOTE — ED Notes (Signed)
Left sided abd pain and vag bleeding x 3 days it is not time for her period 2/7 LMP  Hurts to void

## 2013-03-11 NOTE — ED Notes (Signed)
Reviewing discharge instructions with pacific interpreters

## 2013-03-12 LAB — GC/CHLAMYDIA PROBE AMP
CT PROBE, AMP APTIMA: NEGATIVE
GC PROBE AMP APTIMA: NEGATIVE

## 2013-03-13 NOTE — ED Provider Notes (Signed)
Medical screening examination/treatment/procedure(s) were performed by non-physician practitioner and as supervising physician I was immediately available for consultation/collaboration.  EKG Interpretation   None        Flint MelterElliott L Asahd Can, MD 03/13/13 [id number]

## 2013-10-02 ENCOUNTER — Inpatient Hospital Stay (HOSPITAL_COMMUNITY): Payer: Self-pay

## 2013-10-02 ENCOUNTER — Encounter (HOSPITAL_COMMUNITY): Payer: Self-pay | Admitting: *Deleted

## 2013-10-02 ENCOUNTER — Inpatient Hospital Stay (HOSPITAL_COMMUNITY)
Admission: AD | Admit: 2013-10-02 | Discharge: 2013-10-02 | Disposition: A | Discharge status: Home / Self Care | Payer: Self-pay | Source: Ambulatory Visit | Attending: Obstetrics and Gynecology | Admitting: Obstetrics and Gynecology

## 2013-10-02 DIAGNOSIS — O9989 Other specified diseases and conditions complicating pregnancy, childbirth and the puerperium: Secondary | ICD-10-CM

## 2013-10-02 DIAGNOSIS — O26891 Other specified pregnancy related conditions, first trimester: Secondary | ICD-10-CM

## 2013-10-02 DIAGNOSIS — N949 Unspecified condition associated with female genital organs and menstrual cycle: Secondary | ICD-10-CM | POA: Insufficient documentation

## 2013-10-02 DIAGNOSIS — O99891 Other specified diseases and conditions complicating pregnancy: Secondary | ICD-10-CM | POA: Insufficient documentation

## 2013-10-02 DIAGNOSIS — R102 Pelvic and perineal pain: Secondary | ICD-10-CM

## 2013-10-02 LAB — URINE MICROSCOPIC-ADD ON

## 2013-10-02 LAB — WET PREP, GENITAL
CLUE CELLS WET PREP: NONE SEEN
Trich, Wet Prep: NONE SEEN
Yeast Wet Prep HPF POC: NONE SEEN

## 2013-10-02 LAB — CBC
HCT: 35.4 % — ABNORMAL LOW (ref 36.0–46.0)
Hemoglobin: 11.8 g/dL — ABNORMAL LOW (ref 12.0–15.0)
MCH: 27.3 pg (ref 26.0–34.0)
MCHC: 33.3 g/dL (ref 30.0–36.0)
MCV: 81.8 fL (ref 78.0–100.0)
PLATELETS: 151 10*3/uL (ref 150–400)
RBC: 4.33 MIL/uL (ref 3.87–5.11)
RDW: 16.1 % — AB (ref 11.5–15.5)
WBC: 8.8 10*3/uL (ref 4.0–10.5)

## 2013-10-02 LAB — URINALYSIS, ROUTINE W REFLEX MICROSCOPIC
BILIRUBIN URINE: NEGATIVE
GLUCOSE, UA: NEGATIVE mg/dL
HGB URINE DIPSTICK: NEGATIVE
Ketones, ur: NEGATIVE mg/dL
NITRITE: NEGATIVE
PH: 5.5 (ref 5.0–8.0)
PROTEIN: NEGATIVE mg/dL
SPECIFIC GRAVITY, URINE: 1.01 (ref 1.005–1.030)
UROBILINOGEN UA: 0.2 mg/dL (ref 0.0–1.0)

## 2013-10-02 LAB — POCT PREGNANCY, URINE: PREG TEST UR: POSITIVE — AB

## 2013-10-02 LAB — HCG, QUANTITATIVE, PREGNANCY: HCG, BETA CHAIN, QUANT, S: 133401 m[IU]/mL — AB (ref <5)

## 2013-10-02 NOTE — Progress Notes (Signed)
Pt instructed to take Tylenol as directed on container

## 2013-10-02 NOTE — MAU Note (Signed)
Pt states she has had a positive pregnancy test at home and is having pain tonight. Pt states she had bleeding yesterday when she wiped x 6

## 2013-10-02 NOTE — MAU Provider Note (Signed)
History     CSN: [id number]  Arrival date and time: 10/02/13 0045   First Provider Initiated Contact with Patient 10/02/13 [phone]      Chief Complaint  Patient presents with  . Abdominal Pain  . Vaginal Bleeding  . Possible Pregnancy   Abdominal Pain  Vaginal Bleeding Associated symptoms include abdominal pain.  Possible Pregnancy Associated symptoms include abdominal pain.   Rhonda Townsend is a 33 y.o. a [id number] at Unknown who presents today with cramping and spotting. She has had the cramping for about 4 days. She rates her pain 7/10. She states that she has had spotting off and on as well. She has not established care with an OB she is unsure where she will go.   Past Medical History  Diagnosis Date  . Tachycardia 2014  . Medical history non-contributory     Past Surgical History  Procedure Laterality Date  . Cesarean section      2003 (1st preg in [place])    Family History  Problem Relation Age of Onset  . Alcohol abuse Neg Hx   . Arthritis Neg Hx   . Asthma Neg Hx   . Birth defects Neg Hx   . Cancer Neg Hx   . COPD Neg Hx   . Depression Neg Hx   . Diabetes Neg Hx   . Drug abuse Neg Hx   . Early death Neg Hx   . Hearing loss Neg Hx   . Heart disease Neg Hx   . Hypertension Neg Hx   . Hyperlipidemia Neg Hx   . Kidney disease Neg Hx   . Learning disabilities Neg Hx   . Mental illness Neg Hx   . Mental retardation Neg Hx   . Miscarriages / Stillbirths Neg Hx   . Stroke Neg Hx   . Vision loss Neg Hx     History  Substance Use Topics  . Smoking status: Never Smoker   . Smokeless tobacco: Never Used  . Alcohol Use: No    Allergies: No Known Allergies  Prescriptions prior to admission  Medication Sig Dispense Refill  . HYDROcodone-acetaminophen (NORCO/VICODIN) 5-325 MG per tablet Take 1-2 tablets by mouth every 4 (four) hours as needed.  12 tablet  0  . promethazine (PHENERGAN) 25 MG tablet Take 1 tablet (25 mg total) by mouth every 6  (six) hours as needed for nausea or vomiting.  12 tablet  0    Review of Systems  Gastrointestinal: Positive for abdominal pain.  Genitourinary: Positive for vaginal bleeding.   Physical Exam   Blood pressure 114/69, pulse 82, temperature 98.9 F (37.2 C), resp. rate 16, height 5' 1.5" (1.562 m), weight 62.596 kg (138 lb), last menstrual period 07/29/2013, SpO2 100.00%.  Physical Exam  Nursing note and vitals reviewed. Constitutional: She is oriented to person, place, and time. She appears well-developed and well-nourished. No distress.  Cardiovascular: Normal rate.   Respiratory: Effort normal.  GI: Soft. There is no tenderness. There is no rebound.  Genitourinary:   External: no lesion Vagina: small amount of white discharge Cervix: pink, smooth, no CMT, closed/firm  Uterus: slightly enlarged  Adnexa: NT   Neurological: She is alert and oriented to person, place, and time.  Skin: Skin is warm and dry.  Psychiatric: She has a normal mood and affect.    MAU Course  Procedures  Results for orders placed during the hospital encounter of 10/02/13 (from the past 24 hour(s))  URINALYSIS, ROUTINE W REFLEX MICROSCOPIC  Status: Abnormal   Collection Time    10/02/13 12:52 AM      Result Value Ref Range   Color, Urine YELLOW  YELLOW   APPearance CLEAR  CLEAR   Specific Gravity, Urine 1.010  1.005 - 1.030   pH 5.5  5.0 - 8.0   Glucose, UA NEGATIVE  NEGATIVE mg/dL   Hgb urine dipstick NEGATIVE  NEGATIVE   Bilirubin Urine NEGATIVE  NEGATIVE   Ketones, ur NEGATIVE  NEGATIVE mg/dL   Protein, ur NEGATIVE  NEGATIVE mg/dL   Urobilinogen, UA 0.2  0.0 - 1.0 mg/dL   Nitrite NEGATIVE  NEGATIVE   Leukocytes, UA SMALL (*) NEGATIVE  URINE MICROSCOPIC-ADD ON     Status: None   Collection Time    10/02/13 12:52 AM      Result Value Ref Range   Squamous Epithelial / LPF RARE  RARE   WBC, UA 3-6  <3 WBC/hpf   RBC / HPF 0-2  <3 RBC/hpf   Bacteria, UA RARE  RARE  POCT PREGNANCY, URINE      Status: Abnormal   Collection Time    10/02/13  1:09 AM      Result Value Ref Range   Preg Test, Ur POSITIVE (*) NEGATIVE  CBC     Status: Abnormal   Collection Time    10/02/13  1:14 AM      Result Value Ref Range   WBC 8.8  4.0 - 10.5 K/uL   RBC 4.33  3.87 - 5.11 MIL/uL   Hemoglobin 11.8 (*) 12.0 - 15.0 g/dL   HCT 16.1 (*) 09.6 - 04.5 %   MCV 81.8  78.0 - 100.0 fL   MCH 27.3  26.0 - 34.0 pg   MCHC 33.3  30.0 - 36.0 g/dL   RDW 40.9 (*) 81.1 - 91.4 %   Platelets 151  150 - 400 K/uL  HCG, QUANTITATIVE, PREGNANCY     Status: Abnormal   Collection Time    10/02/13  1:14 AM      Result Value Ref Range   hCG, Beta Chain, Quant, Kathie Rhodes [id number] (*) <5 mIU/mL  WET PREP, GENITAL     Status: Abnormal   Collection Time    10/02/13  1:59 AM      Result Value Ref Range   Yeast Wet Prep HPF POC NONE SEEN  NONE SEEN   Trich, Wet Prep NONE SEEN  NONE SEEN   Clue Cells Wet Prep HPF POC NONE SEEN  NONE SEEN   WBC, Wet Prep HPF POC FEW (*) NONE SEEN   US Ob Comp Less 14 Wks  10/02/2013   CLINICAL DATA:  Cramping and spotting. Estimated gestational age by LMP is 9 weeks 2 days. Quantitative beta HCG is 133,401.  EXAM: OBSTETRIC <14 WK ULTRASOUND  TECHNIQUE: Transabdominal ultrasound was performed for evaluation of the gestation as well as the maternal uterus and adnexal regions.  COMPARISON:  CT abdomen and pelvis 03/11/2013  FINDINGS: Intrauterine gestational sac: A single intrauterine pregnancy is demonstrated.  Yolk sac:  Yolk sac is present.  Embryo:  Fetal pole is present.  Cardiac Activity: Fetal cardiac activity is observed.  Heart Rate: 169 bpm  CRL:   20.6  mm   8 w 5 d                  [place] EDC: 05/09/2014  Maternal uterus/adnexae: No myometrial mass lesions identified. No subchorionic hemorrhage. Both ovaries are visualized and demonstrate normal follicular changes.  No abnormal adnexal masses. No free pelvic fluid collections.  IMPRESSION: Single intrauterine pregnancy. Estimated gestational age  by crown-rump length is 8 weeks 5 days. No acute complication is suggested.   Electronically Signed   By: Burman Nieves M.D.   On: 10/02/2013 02:50     Assessment and Plan   1. Pelvic pain affecting pregnancy in first trimester, antepartum   bleeding precautions  First trimester danger signs  Return to MAU as needed [facility] as soon as possible.   Follow-up Information   Schedule an appointment as soon as possible for a visit with [facility] [place].   Contact information:   [address] [place] [address] [phone]       Tawnya Crook 10/02/2013, 2:00 AM

## 2013-10-02 NOTE — Discharge Instructions (Signed)
Primer trimestre de embarazo (First Trimester of Pregnancy) El primer trimestre de embarazo se extiende desde la semana1 hasta el final de la semana12 (mes1 al mes3). Una semana despus de que un espermatozoide fecunda un vulo, este se implantar en la pared uterina. Este embrin comenzar a desarrollarse hasta convertirse en un beb. Sus genes y los de su pareja forman el beb. Los genes del varn determinan si ser un nio o una nia. Entre la semana6 y la8, se forman los ojos y el rostro, y los latidos del corazn pueden verse en la ecografa. Al final de las 12semanas, todos los rganos del beb estn formados.  Ahora que est embarazada, querr hacer todo lo que est a su alcance para tener un beb sano. Dos de las cosas ms importantes son tener una buena atencin prenatal y seguir las indicaciones del mdico. La atencin prenatal incluye toda la asistencia mdica que usted recibe antes del nacimiento del beb. Esta ayudar a prevenir, detectar y tratar cualquier problema durante el embarazo y el parto. CAMBIOS EN EL ORGANISMO Su organismo atraviesa por muchos cambios durante el embarazo, y estos varan de una mujer a otra.   Al principio, puede aumentar o bajar algunos kilos.  Puede tener malestar estomacal (nuseas) y vomitar. Si no puede controlar los vmitos, llame al mdico.  Puede cansarse con facilidad.  Es posible que tenga dolores de cabeza que pueden aliviarse con los medicamentos que el mdico le permita tomar.  Puede orinar con mayor frecuencia. El dolor al orinar puede significar que usted tiene una infeccin de la vejiga.  Debido al embarazo, puede tener acidez estomacal.  Puede estar estreida, ya que ciertas hormonas enlentecen los movimientos de los msculos que empujan los desechos a travs de los intestinos.  Pueden aparecer hemorroides o abultarse e hincharse las venas (venas varicosas).  Las mamas pueden empezar a agrandarse y estar sensibles. Los pezones  pueden sobresalir ms, y el tejido que los rodea (areola) tornarse ms oscuro.  Las encas pueden sangrar y estar sensibles al cepillado y al hilo dental.  Pueden aparecer zonas oscuras o manchas (cloasma, mscara del embarazo) en el rostro que probablemente se atenuarn despus del nacimiento del beb.  Los perodos menstruales se interrumpirn.  Tal vez no tenga apetito.  Puede sentir un fuerte deseo de consumir ciertos alimentos.  Puede tener cambios a nivel emocional da a da, por ejemplo, por momentos puede estar emocionada por el embarazo y por otros preocuparse porque algo pueda salir mal con el embarazo o el beb.  Tendr sueos ms vvidos y extraos.  Tal vez haya cambios en el cabello que pueden incluir su engrosamiento, crecimiento rpido y cambios en la textura. A algunas mujeres tambin se les cae el cabello durante o despus del embarazo, o tienen el cabello seco o fino. Lo ms probable es que el cabello se le normalice despus del nacimiento del beb. QU DEBE ESPERAR EN LAS CONSULTAS PRENATALES Durante una visita prenatal de rutina:  La pesarn para asegurarse de que usted y el beb estn creciendo normalmente.  Le controlarn la presin arterial.  Le medirn el abdomen para controlar el desarrollo del beb.  Se escucharn los latidos cardacos a partir de la semana10 o la12 de embarazo, aproximadamente.  Se analizarn los resultados de los estudios solicitados en visitas anteriores. El mdico puede preguntarle:  Cmo se siente.  Si siente los movimientos del beb.  Si ha tenido sntomas anormales, como prdida de lquido, sangrado, dolores de cabeza intensos o   clicos abdominales.  Si tiene alguna pregunta. Otros estudios que pueden realizarse durante el primer trimestre incluyen lo siguiente:  Anlisis de sangre para determinar el tipo de sangre y detectar la presencia de infecciones previas. Adems, se los usar para controlar si los niveles de hierro  son bajos (anemia) y determinar los anticuerpos Rh. En una etapa ms avanzada del embarazo, se harn anlisis de sangre para saber si tiene diabetes, junto con otros estudios si surgen problemas.  Anlisis de orina para detectar infecciones, diabetes o protenas en la orina.  Una ecografa para confirmar que el beb crece y se desarrolla correctamente.  Una amniocentesis para diagnosticar posibles problemas genticos.  Estudios del feto para descartar espina bfida y sndrome de Down.  Es posible que necesite otras pruebas adicionales. INSTRUCCIONES PARA EL CUIDADO EN EL HOGAR  Medicamentos:  Siga las indicaciones del mdico en relacin con el uso de medicamentos. Durante el embarazo, hay medicamentos que pueden tomarse y otros que no.  Tome las vitaminas prenatales como se le indic.  Si est estreida, tome un laxante suave, si el mdico lo autoriza. Dieta  Consuma alimentos balanceados. Elija alimentos variados, como carne o protenas de origen vegetal, pescado, leche y productos lcteos descremados, verduras, frutas y panes y cereales integrales. El mdico la ayudar a determinar la cantidad de peso que puede aumentar.  No coma carne cruda ni quesos sin cocinar. Estos elementos contienen bacterias que pueden causar defectos congnitos en el beb.  La ingesta diaria de cuatro o cinco comidas pequeas en lugar de tres comidas abundantes puede ayudar a aliviar las nuseas y los vmitos. Si empieza a tener nuseas, comer algunas galletas saladas puede ser de ayuda. Beber lquidos entre las comidas en lugar de tomarlos durante las comidas tambin puede ayudar a calmar las nuseas y los vmitos.  Si est estreida, consuma alimentos con alto contenido de fibra, como verduras y frutas frescas, y cereales integrales. Beba suficiente lquido para mantener la orina clara o de color amarillo plido. Actividad y ejercicios  Haga ejercicio solamente como se lo haya indicado el mdico. El  ejercicio la ayudar a:  Controlar el peso.  Mantenerse en forma.  Estar preparada para el trabajo de parto y el parto.  Los dolores, los clicos en la parte baja del abdomen o los calambres en la cintura son un buen indicio de que debe dejar de hacer ejercicios. Consulte al mdico antes de seguir haciendo ejercicios normales.  Intente no estar de pie durante mucho tiempo. Mueva las piernas con frecuencia si debe estar de pie en un lugar durante mucho tiempo.  Evite levantar pesos excesivos.  Use zapatos de tacones bajos y mantenga una buena postura.  Puede seguir teniendo relaciones sexuales, excepto que el mdico le indique lo contrario. Alivio del dolor o las molestias  Use un sostn que le brinde buen soporte si siente dolor a la palpacin en las mamas.  Dese baos de asiento con agua tibia para aliviar el dolor o las molestias causadas por las hemorroides. Use crema antihemorroidal si el mdico se lo permite.  Descanse con las piernas elevadas si tiene calambres o dolor de cintura.  Si tiene venas varicosas en las piernas, use medias de descanso. Eleve los pies durante 15minutos, 3 o 4veces por da. Limite la cantidad de sal en su dieta. Cuidados prenatales  Programe las visitas prenatales para la semana12 de embarazo. Generalmente se programan cada mes al principio y se hacen ms frecuentes en los 2 ltimos meses antes   del parto.  Escriba sus preguntas. Llvelas cuando concurra a las visitas prenatales.  Concurra a todas las visitas prenatales como se lo haya indicado el mdico. Seguridad  Colquese el cinturn de seguridad cuando conduzca.  Haga una lista de los nmeros de telfono de emergencia, que incluya los nmeros de telfono de familiares, amigos, el hospital y los departamentos de polica y bomberos. Consejos generales  Pdale al mdico que la derive a clases de educacin prenatal en su localidad. Debe comenzar a tomar las clases antes de entrar en el mes6  de embarazo.  Pida ayuda si tiene necesidades nutricionales o de asesoramiento durante el embarazo. El mdico puede aconsejarla o derivarla a especialistas para que la ayuden con diferentes necesidades.  No se d baos de inmersin en agua caliente, baos turcos ni saunas.  No se haga duchas vaginales ni use tampones o toallas higinicas perfumadas.  No mantenga las piernas cruzadas durante mucho tiempo.  Evite el contacto con las bandejas sanitarias de los gatos y la tierra que estos animales usan. Estos elementos contienen bacterias que pueden causar defectos congnitos al beb y la posible prdida del feto debido a un aborto espontneo o muerte fetal.  No fume, no consuma hierbas ni medicamentos que no hayan sido recetados por el mdico. Las sustancias qumicas que estos productos contienen afectan la formacin y el desarrollo del beb.  Programe una cita con el dentista. En su casa, lvese los dientes con un cepillo dental blando y psese el hilo dental con suavidad. SOLICITE ATENCIN MDICA SI:   Tiene mareos.  Siente clicos leves, presin en la pelvis o dolor persistente en el abdomen.  Tiene nuseas, vmitos o diarrea persistentes.  Tiene secrecin vaginal con mal olor.  Siente dolor al orinar.  Tiene el rostro, las manos, las piernas o los tobillos ms hinchados. SOLICITE ATENCIN MDICA DE INMEDIATO SI:   Tiene fiebre.  Tiene una prdida de lquido por la vagina.  Tiene sangrado o pequeas prdidas vaginales.  Siente dolor intenso o clicos en el abdomen.  Sube o baja de peso rpidamente.  Vomita sangre de color rojo brillante o material que parezca granos de caf.  Ha estado expuesta a la rubola y no ha sufrido la enfermedad.  Ha estado expuesta a la quinta enfermedad o a la varicela.  Tiene un dolor de cabeza intenso.  Le falta el aire.  Sufre cualquier tipo de traumatismo, por ejemplo, debido a una cada o un accidente automovilstico. Document  Released: 10/19/2004 Document Revised: 05/26/2013 ExitCare Patient Information 2015 [employer]. This information is not intended to replace advice given to you by your health care provider. Make sure you discuss any questions you have with your health care provider.   

## 2013-10-02 NOTE — MAU Note (Signed)
Pt reports pain in lower abd, positive home preg test. Had some spotting yesterday. LMP 07/29/2013. Rectal pressure.

## 2013-10-02 NOTE — Progress Notes (Signed)
I assisted the patient with admission paper work., also I was in the room with Herbert Seta MD, and Massie Bougie RN, with a explanation about care plan. Also I help Belnida Rn later, with a discharge instructions and paper work  By Orlan Leavens, Spanish Interpreter.

## 2013-10-03 LAB — URINE CULTURE
CULTURE: NO GROWTH
Colony Count: NO GROWTH

## 2013-10-06 NOTE — MAU Provider Note (Signed)
Attestation of Attending Supervision of Advanced Practitioner: Evaluation and management procedures were performed by the PA/NP/CNM/OB Fellow under my supervision/collaboration. Chart reviewed and agree with management and plan.  Saanvi Hakala V 10/06/2013 4:51 PM

## 2013-11-24 ENCOUNTER — Encounter (HOSPITAL_COMMUNITY): Payer: Self-pay | Admitting: *Deleted

## 2014-01-11 ENCOUNTER — Encounter (HOSPITAL_COMMUNITY): Payer: Self-pay | Admitting: *Deleted

## 2014-01-11 ENCOUNTER — Inpatient Hospital Stay (HOSPITAL_COMMUNITY)
Admission: AD | Admit: 2014-01-11 | Discharge: 2014-01-11 | Disposition: A | Discharge status: Home / Self Care | Payer: Medicaid Other | Source: Ambulatory Visit | Attending: Family Medicine | Admitting: Family Medicine

## 2014-01-11 DIAGNOSIS — N949 Unspecified condition associated with female genital organs and menstrual cycle: Secondary | ICD-10-CM

## 2014-01-11 DIAGNOSIS — O26892 Other specified pregnancy related conditions, second trimester: Secondary | ICD-10-CM | POA: Insufficient documentation

## 2014-01-11 DIAGNOSIS — Z3A23 23 weeks gestation of pregnancy: Secondary | ICD-10-CM | POA: Insufficient documentation

## 2014-01-11 DIAGNOSIS — R102 Pelvic and perineal pain: Secondary | ICD-10-CM | POA: Insufficient documentation

## 2014-01-11 DIAGNOSIS — O9989 Other specified diseases and conditions complicating pregnancy, childbirth and the puerperium: Secondary | ICD-10-CM

## 2014-01-11 LAB — URINE MICROSCOPIC-ADD ON

## 2014-01-11 LAB — URINALYSIS, ROUTINE W REFLEX MICROSCOPIC
BILIRUBIN URINE: NEGATIVE
Glucose, UA: NEGATIVE mg/dL
Hgb urine dipstick: NEGATIVE
KETONES UR: NEGATIVE mg/dL
Nitrite: NEGATIVE
PH: 6 (ref 5.0–8.0)
Protein, ur: NEGATIVE mg/dL
Specific Gravity, Urine: >1.03 — ABNORMAL HIGH (ref 1.005–1.030)
UROBILINOGEN UA: 0.2 mg/dL (ref 0.0–1.0)

## 2014-01-11 LAB — CBC
HCT: 36.1 % (ref 36.0–46.0)
HEMOGLOBIN: 12 g/dL (ref 12.0–15.0)
MCH: 29.6 pg (ref 26.0–34.0)
MCHC: 33.2 g/dL (ref 30.0–36.0)
MCV: 88.9 fL (ref 78.0–100.0)
PLATELETS: 151 10*3/uL (ref 150–400)
RBC: 4.06 MIL/uL (ref 3.87–5.11)
RDW: 15.6 % — ABNORMAL HIGH (ref 11.5–15.5)
WBC: 8.7 10*3/uL (ref 4.0–10.5)

## 2014-01-11 NOTE — Progress Notes (Signed)
Assisted midwife with interpretation of patient assessment. Spanish Interpreter - Joselyn GlassmanBenita Townsend

## 2014-01-11 NOTE — MAU Note (Signed)
Pt presents to MAU with complaints of pain in her left lower side. Denies any vaginal bleeding or LOF. No PNC

## 2014-01-11 NOTE — Progress Notes (Signed)
Assisted RN with interpretation of initial assessment. Spanish Interpreter - Joselyn GlassmanBenita Sanchez

## 2014-01-11 NOTE — Progress Notes (Signed)
Assisted RN and midwife with interpretation of discharge instructions.  Spanish Interpreter - Joselyn GlassmanBenita Sanchez

## 2014-01-11 NOTE — Progress Notes (Signed)
Assisted with interpretation of registration.  Spanish Interpreter - Joselyn GlassmanBenita Sanchez

## 2014-01-11 NOTE — Discharge Instructions (Signed)
Round Ligament Pain During Pregnancy   Round ligament pain is a sharp pain or jabbing feeling often felt in the lower belly or groin area on one or both sides. It is one of the most common complaints during pregnancy and is considered a normal part of pregnancy. It is most often felt during the second trimester.   Here is what you need to know about round ligament pain, including some tips to help you feel better.   Causes of Round Ligament Pain   Several thick ligaments surround and support your womb (uterus) as it grows during pregnancy. One of them is called the round ligament.   The round ligament connects the front part of the womb to your groin, the area where your legs attach to your pelvis. The round ligament normally tightens and relaxes slowly.   As your baby and womb grow, the round ligament stretches. That makes it more likely to become strained.   Sudden movements can cause the ligament to tighten quickly, like a rubber band snapping. This causes a sudden and quick jabbing feeling.   Symptoms of Round Ligament Pain   Round ligament pain can be concerning and uncomfortable. But it is considered normal as your body changes during pregnancy.   The symptoms of round ligament pain include a sharp, sudden spasm in the belly. It usually affects the right side, but it may happen on both sides. The pain only lasts a few seconds.   Exercise may cause the pain, as will rapid movements such as:  sneezing  coughing  laughing  rolling over in bed  standing up too quickly   Treatment of Round Ligament Pain   Here are some tips that may help reduce your discomfort:   Pain relief. Take over-the-counter acetaminophen for pain, if necessary. Ask your doctor if this is OK.   Exercise. Get plenty of exercise to keep your stomach (core) muscles strong. Doing stretching exercises or prenatal yoga can be helpful. Ask your doctor which exercises are safe for you and your baby.   A helpful  exercise involves putting your hands and knees on the floor, lowering your head, and pushing your backside into the air.   Avoid sudden movements. Change positions slowly (such as standing up or sitting down) to avoid sudden movements that may cause stretching and pain.   Flex your hips. Bend and flex your hips before you cough, sneeze, or laugh to avoid pulling on the ligaments.   Apply warmth. A heating pad or warm bath may be helpful. Ask your doctor if this is OK. Extreme heat can be dangerous to the baby.   You should try to modify your daily activity level and avoid positions that may worsen the condition.   When to Call the Doctor/Midwife   Always tell your doctor or midwife about any type of pain you have during pregnancy. Round ligament pain is quick and doesn't last long.   Call your health care provider immediately if you have:  severe pain  fever  chills  pain on urination  difficulty walking   Belly pain during pregnancy can be due to many different causes. It is important for your doctor to rule out more serious conditions, including pregnancy complications such as placenta abruption or non-pregnancy illnesses such as:  inguinal hernia  appendicitis  stomach, liver, and kidney problems  Preterm labor pains may sometimes be mistaken for round ligament pain.    Dolor abdominal en el embarazo (Abdominal Pain During Pregnancy)  El dolor abdominal es frecuente durante el Makotiembarazo. Generalmente no causa ningn dao. El dolor abdominal puede tener numerosas causas. Algunas causas son ms graves que otras. Ciertas causas de dolor abdominal durante el embarazo se diagnostican fcilmente. A veces, se tarda un tiempo para llegar al diagnstico. Otras veces la causa no se conoce. El dolor abdominal puede estar relacionado con [place]una alteracin del [place]barazo, o puede deberse a una causa totalmente diferente. Por este motivo, siempre consulte a su mdico cuando sienta molestias  abdominales. INSTRUCCIONES PARA EL CUIDADO EN EL HOGAR  Est atenta al dolor para ver si hay cambios. Las siguientes indicaciones ayudarn a Psychologist, educationalaliviar cualquier Longs Drug Storesmolestia que pueda sentir:  No Chiropodisttenga relaciones sexuales y no coloque nada dentro de la vagina hasta que los sntomas hayan desaparecido completamente.  Descanse todo lo que pueda RadioShackhasta que el dolor se le haya calmado.  Si siente nuseas, beba lquidos claros. Evite los alimentos slidos mientras sienta malestar o tenga nuseas.  Tome slo medicamentos de venta libre o recetados, segn las indicaciones del mdico.  Cumpla con todas las visitas de control, segn le indique su mdico. SOLICITE ATENCIN MDICA DE INMEDIATO SI:  Tiene un sangrado, prdida de lquidos o elimina tejidos por la vagina.  El dolor o los clicos Robesoniaaumentan.  Tiene vmitos persistentes.  Comienza a Financial risk analystsentir dolor al orinar u Centex Corporationobserva sangre.  Tiene fiebre.  Nota que los movimientos del beb disminuyen.  Siente intensa debilidad o se marea.  Tiene dificultad para respirar con o sin dolor abdominal.  Siente un dolor de cabeza intenso junto al dolor abdominal.  Shelle Ironiene una secrecin vaginal anormal con dolor abdominal.  Tiene diarrea persistente.  El dolor abdominal sigue o empeora an despus de Field seismologisthacer reposo. ASEGRESE DE QUE:   Comprende estas instrucciones.  Controlar su afeccin.  Recibir ayuda de inmediato si no mejora o si empeora. Document Released: 01/09/2005 Document Revised: 10/30/2012 [facility]Exit[facility] Patient Information 2015 [place]Care, [place]. This information is not intended to replace advice given to you by your health care provider. Make sure you discuss any questions you have with your health care provider.

## 2014-01-11 NOTE — MAU Provider Note (Signed)
History     CSN: [id number]  Arrival date and time: 01/11/14 1558   None     Chief Complaint  Patient presents with  . no PNC   . ovary pain    HPI This is a 33 y.o. female at 4049w1d who presents with c/o LLQ pain for 2 months with new left leg pain. Also has left hip pain. Denies leaking or bleeding. Has not gotten Prenatal care yet. States HD told her she could not come until after 6.5 months.   RN Note: Pt presents to MAU with complaints of pain in her left lower side. Denies any vaginal bleeding or LOF. No PNC          OB History    Gravida Para Term Preterm AB TAB SAB Ectopic Multiple Living   6 5 4 1  0 0 0 0 0 5      Past Medical History  Diagnosis Date  . Tachycardia 2014  . Medical history non-contributory     Past Surgical History  Procedure Laterality Date  . Cesarean section      2003 [place]    Family History  Problem Relation Age of Onset  . Alcohol abuse Neg Hx   . Arthritis Neg Hx   . Asthma Neg Hx   . Birth defects Neg Hx   . Cancer Neg Hx   . COPD Neg Hx   . Depression Neg Hx   . Diabetes Neg Hx   . Drug abuse Neg Hx   . Early death Neg Hx   . Hearing loss Neg Hx   . Heart disease Neg Hx   . Hypertension Neg Hx   . Hyperlipidemia Neg Hx   . Kidney disease Neg Hx   . Learning disabilities Neg Hx   . Mental illness Neg Hx   . Mental retardation Neg Hx   . Miscarriages / Stillbirths Neg Hx   . Stroke Neg Hx   . Vision loss Neg Hx     History  Substance Use Topics  . Smoking status: Never Smoker   . Smokeless tobacco: Never Used  . Alcohol Use: No    Allergies: No Known Allergies  Prescriptions prior to admission  Medication Sig Dispense Refill Last Dose  . HYDROcodone-acetaminophen (NORCO/VICODIN) 5-325 MG per tablet Take 1-2 tablets by mouth every 4 (four) hours as needed. 12 tablet 0 More than a month at Unknown time  . promethazine (PHENERGAN) 25 MG tablet Take 1 tablet (25 mg total) by mouth every 6 (six)  hours as needed for nausea or vomiting. 12 tablet 0 More than a month at Unknown time    Review of Systems  Constitutional: Negative for fever, chills and malaise/fatigue.  Gastrointestinal: Positive for abdominal pain (LLQ). Negative for nausea, vomiting, diarrhea and constipation (last BM today).  Genitourinary: Negative for dysuria.  Musculoskeletal:       Leg and hip pain on left   Neurological: Negative for dizziness.   Physical Exam   Blood pressure 119/62, pulse 89, temperature 98 F (36.7 C), resp. rate 18, last menstrual period 07/29/2013.  Physical Exam  Constitutional: She is oriented to person, place, and time. She appears well-developed and well-nourished. No distress.  HENT:  Head: Normocephalic.  Respiratory: Effort normal.  GI: Soft. She exhibits no distension and no mass. There is tenderness (over round ligament). There is no rebound and no guarding.  Genitourinary: Vagina normal. No vaginal discharge found.  Musculoskeletal: Normal range of motion.  Neurological: She is alert and oriented to person, place, and time.  Skin: Skin is warm and dry.  Psychiatric: She has a normal mood and affect.    MAU Course  Procedures  MDM Will check CBC and do cultures (July visit only had wet prep). Results for orders placed or performed during the hospital encounter of 01/11/14 (from the past 24 hour(s))  Urinalysis, Routine w reflex microscopic     Status: Abnormal   Collection Time: 01/11/14  4:30 PM  Result Value Ref Range   Color, Urine YELLOW YELLOW   APPearance CLEAR CLEAR   Specific Gravity, Urine >1.030 (H) 1.005 - 1.030   pH 6.0 5.0 - 8.0   Glucose, UA NEGATIVE NEGATIVE mg/dL   Hgb urine dipstick NEGATIVE NEGATIVE   Bilirubin Urine NEGATIVE NEGATIVE   Ketones, ur NEGATIVE NEGATIVE mg/dL   Protein, ur NEGATIVE NEGATIVE mg/dL   Urobilinogen, UA 0.2 0.0 - 1.0 mg/dL   Nitrite NEGATIVE NEGATIVE   Leukocytes, UA SMALL (A) NEGATIVE  Urine microscopic-add on      Status: Abnormal   Collection Time: 01/11/14  4:30 PM  Result Value Ref Range   Squamous Epithelial / LPF FEW (A) RARE   WBC, UA 3-6 <3 WBC/hpf   RBC / HPF 0-2 <3 RBC/hpf   Bacteria, UA FEW (A) RARE  CBC     Status: Abnormal   Collection Time: 01/11/14  5:40 PM  Result Value Ref Range   WBC 8.7 4.0 - 10.5 K/uL   RBC 4.06 3.87 - 5.11 MIL/uL   Hemoglobin 12.0 12.0 - 15.0 g/dL   HCT 14.736.1 82.936.0 - 56.246.0 %   MCV 88.9 78.0 - 100.0 fL   MCH 29.6 26.0 - 34.0 pg   MCHC 33.2 30.0 - 36.0 g/dL   RDW 13.015.6 (H) 86.511.5 - 78.415.5 %   Platelets 151 150 - 400 K/uL       Assessment and Plan  A:  SIUP at 2364w2d       Probable round ligament pain  P:  Discharge home       [facility]in[facility]        Lilyan Punt Burleson NP will send message to [facility]        Comfort measures  [facility] L.[facility]. CampusWILLIAMS,MARIE 01/11/2014, 5:32 PM

## 2014-01-12 LAB — GC/CHLAMYDIA PROBE AMP
CT PROBE, AMP APTIMA: NEGATIVE
GC Probe RNA: NEGATIVE

## 2014-01-12 LAB — HIV ANTIBODY (ROUTINE TESTING W REFLEX): HIV 1&2 Ab, 4th Generation: NONREACTIVE

## 2014-01-13 LAB — CULTURE, OB URINE
Colony Count: NO GROWTH
Culture: NO GROWTH
Special Requests: NORMAL

## 2014-01-21 ENCOUNTER — Encounter (HOSPITAL_COMMUNITY): Payer: Self-pay | Admitting: *Deleted

## 2014-01-21 ENCOUNTER — Inpatient Hospital Stay (HOSPITAL_COMMUNITY)
Admission: AD | Admit: 2014-01-21 | Discharge: 2014-01-21 | Disposition: A | Discharge status: Home / Self Care | Payer: Medicaid Other | Source: Ambulatory Visit | Attending: Family Medicine | Admitting: Family Medicine

## 2014-01-21 ENCOUNTER — Inpatient Hospital Stay (HOSPITAL_COMMUNITY): Payer: Medicaid Other

## 2014-01-21 DIAGNOSIS — W1830XA Fall on same level, unspecified, initial encounter: Secondary | ICD-10-CM

## 2014-01-21 DIAGNOSIS — O9989 Other specified diseases and conditions complicating pregnancy, childbirth and the puerperium: Secondary | ICD-10-CM | POA: Insufficient documentation

## 2014-01-21 DIAGNOSIS — R1084 Generalized abdominal pain: Secondary | ICD-10-CM | POA: Insufficient documentation

## 2014-01-21 DIAGNOSIS — W109XXA Fall (on) (from) unspecified stairs and steps, initial encounter: Secondary | ICD-10-CM | POA: Insufficient documentation

## 2014-01-21 DIAGNOSIS — O9A219 Injury, poisoning and certain other consequences of external causes complicating pregnancy, unspecified trimester: Secondary | ICD-10-CM | POA: Insufficient documentation

## 2014-01-21 DIAGNOSIS — Y92009 Unspecified place in unspecified non-institutional (private) residence as the place of occurrence of the external cause: Secondary | ICD-10-CM | POA: Insufficient documentation

## 2014-01-21 DIAGNOSIS — S3991XA Unspecified injury of abdomen, initial encounter: Secondary | ICD-10-CM

## 2014-01-21 DIAGNOSIS — Z3A24 24 weeks gestation of pregnancy: Secondary | ICD-10-CM | POA: Insufficient documentation

## 2014-01-21 DIAGNOSIS — M545 Low back pain: Secondary | ICD-10-CM | POA: Insufficient documentation

## 2014-01-21 DIAGNOSIS — S3992XA Unspecified injury of lower back, initial encounter: Secondary | ICD-10-CM

## 2014-01-21 DIAGNOSIS — Y92099 Unspecified place in other non-institutional residence as the place of occurrence of the external cause: Secondary | ICD-10-CM

## 2014-01-21 DIAGNOSIS — W19XXXA Unspecified fall, initial encounter: Secondary | ICD-10-CM

## 2014-01-21 DIAGNOSIS — Z3A25 25 weeks gestation of pregnancy: Secondary | ICD-10-CM | POA: Insufficient documentation

## 2014-01-21 LAB — URINALYSIS, ROUTINE W REFLEX MICROSCOPIC
Bilirubin Urine: NEGATIVE
Glucose, UA: NEGATIVE mg/dL
HGB URINE DIPSTICK: NEGATIVE
Ketones, ur: NEGATIVE mg/dL
Nitrite: NEGATIVE
PH: 6 (ref 5.0–8.0)
Protein, ur: NEGATIVE mg/dL
Urobilinogen, UA: 0.2 mg/dL (ref 0.0–1.0)

## 2014-01-21 LAB — RPR

## 2014-01-21 LAB — URINE MICROSCOPIC-ADD ON

## 2014-01-21 LAB — HEPATITIS B SURFACE ANTIGEN: HEP B S AG: NEGATIVE

## 2014-01-21 LAB — RUBELLA SCREEN: Rubella: 14.8 Index — ABNORMAL HIGH (ref <0.90)

## 2014-01-21 MED ORDER — ACETAMINOPHEN 325 MG PO TABS
650.0000 mg | ORAL_TABLET | Freq: Once | ORAL | Status: AC
  Administered 2014-01-21: 650 mg via ORAL
  Filled 2014-01-21: qty 2

## 2014-01-21 NOTE — MAU Provider Note (Signed)
None     Chief Complaint:  Rhonda Townsend   Rhonda Townsend is  33 y.o. [id number] at 113w4d presents complaining of Fall at home at 2200.  Now is having lower back pain and lower generalized abdominal pain.  No bleeding, no cramping.  Obstetrical/Gynecological History: OB History    Gravida Para Term Preterm AB TAB SAB Ectopic Multiple Living   6 5 4 1  0 0 0 0 0 5     Past Medical History: Past Medical History  Diagnosis Date  . Tachycardia 2014  . Medical history non-contributory     Past Surgical History: Past Surgical History  Procedure Laterality Date  . Cesarean section      2003 [place]    Family History: Family History  Problem Relation Age of Onset  . Alcohol abuse Neg Hx   . Arthritis Neg Hx   . Asthma Neg Hx   . Birth defects Neg Hx   . Cancer Neg Hx   . COPD Neg Hx   . Depression Neg Hx   . Diabetes Neg Hx   . Drug abuse Neg Hx   . Early death Neg Hx   . Hearing loss Neg Hx   . Heart disease Neg Hx   . Hypertension Neg Hx   . Hyperlipidemia Neg Hx   . Kidney disease Neg Hx   . Learning disabilities Neg Hx   . Mental illness Neg Hx   . Mental retardation Neg Hx   . Miscarriages / Stillbirths Neg Hx   . Stroke Neg Hx   . Vision loss Neg Hx     Social History: History  Substance Use Topics  . Smoking status: Never Smoker   . Smokeless tobacco: Never Used  . Alcohol Use: No    Allergies: No Known Allergies  Meds:  Prescriptions prior to admission  Medication Sig Dispense Refill Last Dose  . HYDROcodone-acetaminophen (NORCO/VICODIN) 5-325 MG per tablet Take 1-2 tablets by mouth every 4 (four) hours as needed. 12 tablet 0 More than a month at Unknown time  . promethazine (PHENERGAN) 25 MG tablet Take 1 tablet (25 mg total) by mouth every 6 (six) hours as needed for nausea or vomiting. 12 tablet 0 More than a month at Unknown time    Review of Systems   Constitutional: Negative for fever and chills Eyes: Negative for visual  disturbances Respiratory: Negative for shortness of breath, dyspnea Cardiovascular: Negative for chest pain or palpitations  Gastrointestinal: Negative for vomiting, diarrhea and constipation Genitourinary: Negative for dysuria and urgency Musculoskeletal: Negative for joint pain, myalgias  Neurological: Negative for dizziness and headaches     Physical Exam  Blood pressure 110/66, pulse 86, temperature 98.6 F (37 C), temperature source Oral, resp. rate 16, height 5' 1.5" (1.562 m), weight 70.761 kg (156 lb), last menstrual period 07/29/2013, SpO2 100 %. GENERAL: Well-developed, well-nourished female in no acute distress.  LUNGS: Clear to auscultation bilaterally.  HEART: Regular rate and rhythm. ABDOMEN: Soft, nontender, nondistended, gravid. No guarding or rebound tenderness EXTREMITIES: Nontender, no edema, 2+ distal pulses. DTR's 2+ FHT:  Baseline rate 145 bpm   Variability moderate  Accelerations present   Decelerations none Contractions: Every 0 mins   Labs: Results for orders placed or performed during the hospital encounter of 01/21/14 (from the past 24 hour(s))  Urinalysis, Routine w reflex microscopic   Collection Time: 01/21/14  1:06 AM  Result Value Ref Range   Color, Urine YELLOW YELLOW   APPearance CLEAR  CLEAR   Specific Gravity, Urine <1.005 (L) 1.005 - 1.030   pH 6.0 5.0 - 8.0   Glucose, UA NEGATIVE NEGATIVE mg/dL   Hgb urine dipstick NEGATIVE NEGATIVE   Bilirubin Urine NEGATIVE NEGATIVE   Ketones, ur NEGATIVE NEGATIVE mg/dL   Protein, ur NEGATIVE NEGATIVE mg/dL   Urobilinogen, UA 0.2 0.0 - 1.0 mg/dL   Nitrite NEGATIVE NEGATIVE   Leukocytes, UA TRACE (A) NEGATIVE  Urine microscopic-add on   Collection Time: 01/21/14  1:06 AM  Result Value Ref Range   Squamous Epithelial / LPF FEW (A) RARE   WBC, UA 0-2 <3 WBC/hpf   RBC / HPF 0-2 <3 RBC/hpf   Bacteria, UA FEW (A) RARE   Imaging Studies:  No results found.  Assessment: Rhonda Townsend is   33 y.o. [phone] at 164w4d presents with SP fall.  Plan: US, EFM for 4 hours.  Get Prenatal labs (No PNC--plans to go to HD soon)  CRESENZO-DISHMAN,Yohan Samons 12/30/20152:19 AM  US normal without signs of abruption.  EFM normal. PT discharged with bleeding/pain precautions.  CRESENZO-DISHMAN,Ohana Birdwell 6:54 AM

## 2014-01-21 NOTE — Progress Notes (Signed)
I assisted Diplomatic Services operational officerDeborah RN, with some more questions, by Orlan LeavensViria Alvarez, Interpreter

## 2014-01-21 NOTE — Discharge Instructions (Signed)
°¿Qué debo saber acerca de las lesiones durante el embarazo? °(What Do I Need to Know About Injuries During Pregnancy?) °Los traumatismos son la causa más frecuente de lesión y muerte en las embarazadas, y también pueden causar daños importantes o la muerte del bebé. °En el vientre (útero), el bebé está protegido por una bolsa llena de líquido (saco amniótico). Si se produce un traumatismo directo de alto impacto en el abdomen y la pelvis, el bebé puede sufrir daños. Este tipo de traumatismo puede causar el desgarro del útero, la separación de la placenta de la pared uterina (desprendimiento de la placenta) o la rotura del saco amniótico (ruptura de las membranas). Estas lesiones pueden disminuir o interrumpir la irrigación de sangre al bebé, o provocar el trabajo de parto antes de lo previsto. Generalmente, las caídas menores y los accidentes automovilísticos de bajo impacto no dañan al bebé, incluso si usted sufre lesiones muy leves. °¿QUÉ TIPOS DE LESIONES PUEDEN AFECTAR MI EMBARAZO? °Las causas más frecuentes de lesión o muerte de un bebé incluyen lo siguiente: °· Caídas. Las caídas son más frecuentes en el segundo y el tercer trimestre del embarazo. Los factores que aumentan el riesgo de caídas son: °¨ Aumento de peso. °¨ Cambio en el centro de gravedad. °¨ Tropezones con un objeto que no puede ver. °¨ Falta de rigidez (flacidez) de los ligamentos, lo que provoca movimientos menos coordinados (puede sentirse torpe). °¨ Caídas mientras se realizan actividades de alto riesgo, como equitación o esquí. °· Accidentes automovilísticos. Es importante usar correctamente el cinturón de seguridad, con el cinturón del regazo por debajo del abdomen, y conducir siempre con cuidado. °· Violencia o agresión doméstica. °· Quemaduras (por fuego o electricidad). °Las causas más frecuentes de lesiones o muerte de las embarazadas incluyen lo siguiente: °· Lesiones que causan una hemorragia grave, shock y la pérdida de la  irrigación sanguínea a los principales órganos. °· Lesiones en la cabeza o el cuello que causan lesiones cerebrales o espinales graves. °· Traumatismos en el tórax que pueden causar una lesión directa en el corazón y los pulmones, o cualquier lesión que afecta la zona de las costillas. Los traumatismos en estas zonas pueden causar un paro cardiorrespiratorio. °¿QUÉ PUEDO HACER PARA PROTEGERME Y PROTEGER A MI BEBÉ DE LAS LESIONES MIENTRAS ESTOY EMBARAZADA? °· Quite las alfombrillas con las que puede resbalarse y los objetos sueltos en el piso que aumentan el riesgo de tropezones. °· No camine sobre pisos mojados o resbalosos. °· Use un calzado cómodo con suela de buena adherencia. No use zapatos con tacones altos. °· Use siempre el cinturón de seguridad del modo correcto, con el cinturón del regazo por debajo del abdomen, y conduzca con cuidado. No ande en motocicleta mientras está embarazada. °· No participe en actividades ni practique deportes de alto impacto. °· Evite encender fuego, levantar recipientes pesados que contengan líquidos calientes, o reparar problemas eléctricos. °· Utilice los medicamentos de venta libre o recetados para calmar el dolor, el malestar o la fiebre, según se lo indique el médico. °· Conozca su tipo de sangre y el tipo de sangre del padre en caso de que tenga una hemorragia vaginal o sufra una lesión debido a la cual sea necesaria una transfusión de sangre. °· Comuníquese con el servicio de emergencias de su localidad (911 en los [place]) si es víctima de violencia o agresión doméstica. El abuso a la mujer puede ser causa significativa de traumatismo durante el embarazo. Para obtener ayuda y apoyo, póngase en contacto con   la Línea directa nacional para la violencia doméstica (National Domestic Violence Hotline). °¿CUÁNDO DEBO BUSCAR ASISTENCIA MÉDICA INMEDIATA?  °· Se cae sobre el abdomen o sufre un accidente o una lesión muy fuertes. °· Sufrió una agresión (doméstica o de otro  tipo). °· Tuvo un accidente automovilístico. °· Presenta una hemorragia vaginal abundante. °· Tiene pérdida de líquido por la vagina. °· Siente contracciones uterinas (cólicos pélvicos, dolor o mucho dolor de espalda). °· Se siente débil, se desmaya o presenta vómitos persistentes luego de cualquier traumatismo. °· Sufrió una quemadura grave, por ejemplo, quemaduras en el rostro, el cuello, las manos o los genitales, o las quemaduras son más grandes que el tamaño de la palma de la mano en cualquier otro lugar. °· Tiene rigidez o dolor en el cuello después de una caída o debido a otro traumatismo. °· Siente dolor de cabeza o tiene problemas visuales después de una caída o algún otro traumatismo. °· No siente que el bebé se mueve o el bebé no se mueve tanto como antes de sufrir una caída u otro traumatismo. °Document Released: 01/09/2005 Document Revised: 05/26/2013 °ExitCare® Patient Information ©2015 [employer]. This information is not intended to replace advice given to you by your health care provider. Make sure you discuss any questions you have with your health care provider. ° ° °

## 2014-01-21 NOTE — MAU Note (Signed)
INTERPRETER-   VIREA     IN L/D.

## 2014-01-21 NOTE — MAU Note (Addendum)
PT SAYS  SHE FELL  AT 10PM  DOWN  12 STEPS  -  BOUNCED  DOWN ON  BUTT-  BUT  HIT HEAD ON FIRST   STEP.     SHE   VOMITED AT   BOTTOM OF   STEPS.  NO NAUSEA  NOW.   HAS H/A - STARTED AFTER FALL.   DID NOT HIT ABD-  BUT ABD HURTS CRAMPS - WHEN SHE MOVES. Marland Kitchen.     GETS PNC-  NONE-   OTHER  BABIES - CLINIC.    SAYS  WAS WAITING FOR APPOINTMENT ON 02-12-2014   AT  [facility]   FOR PAPERWORK

## 2014-01-21 NOTE — MAU Note (Signed)
Pt reports she fell down 12 steps on her butt. This happened at 2200, denies bleeding. Pt reports she is having a lot of pain in her neck area and her back.

## 2014-01-21 NOTE — Progress Notes (Signed)
I assisted Diplomatic Services operational officerDeborah RN and Drenda FreezeFran Midwife with discharge instructions, by Orlan LeavensViria Alvarez, Interpreter.

## 2014-01-21 NOTE — Progress Notes (Signed)
I assisted Diplomatic Services operational officerDeborah RN with some questions and she review Haywood LassoLynette notes about the patient, Midwife Drenda FreezeFran came to ask questions too, by Orlan LeavensViria Alvarez Spanish Interpreter.

## 2014-01-21 NOTE — Progress Notes (Signed)
I assisted Scientist, forensicLynette RN with initial questions by Orlan LeavensViria Alvarez Interpreter

## 2014-01-23 NOTE — L&D Delivery Note (Cosign Needed)
Delivery Note Pt pushed well and at 2:35 AM a viable female was delivered via VBAC (Presentation: Right Occiput Anterior).  APGAR:8 ,9 ; weight: 7+12.4.  Cord clamped and cut by family member. Hospital cord blood sample collected. Placenta status: Intact, Spontaneous w/ large amount of blood, however FF with massage. Cord: 3 vessels   Anesthesia: Epidural  Episiotomy: None Lacerations: None Est. Blood Loss (mL):  500  Mom to postpartum.  Baby to Couplet care / Skin to Skin. Given cytotec 800mcg due to large amt of blood with delivery of placenta, and then given methergine series due to sm trickle. While on L&D had a sm amt of placental membrane to come out with uterine massage; bldg slowed.  Cam HaiSHAW, Paislynn Hegstrom CNM 05/07/2014, 3:12 AM

## 2014-02-02 ENCOUNTER — Encounter: Payer: Self-pay | Admitting: Obstetrics and Gynecology

## 2014-02-02 ENCOUNTER — Ambulatory Visit (INDEPENDENT_AMBULATORY_CARE_PROVIDER_SITE_OTHER): Payer: Medicaid Other | Admitting: Obstetrics and Gynecology

## 2014-02-02 VITALS — BP 110/57 | HR 80 | Temp 97.4°F | Wt 156.6 lb

## 2014-02-02 DIAGNOSIS — F32A Depression, unspecified: Secondary | ICD-10-CM

## 2014-02-02 DIAGNOSIS — Z23 Encounter for immunization: Secondary | ICD-10-CM

## 2014-02-02 DIAGNOSIS — Z3482 Encounter for supervision of other normal pregnancy, second trimester: Secondary | ICD-10-CM

## 2014-02-02 DIAGNOSIS — Z3483 Encounter for supervision of other normal pregnancy, third trimester: Secondary | ICD-10-CM

## 2014-02-02 DIAGNOSIS — F329 Major depressive disorder, single episode, unspecified: Secondary | ICD-10-CM

## 2014-02-02 DIAGNOSIS — O99342 Other mental disorders complicating pregnancy, second trimester: Secondary | ICD-10-CM

## 2014-02-02 MED ORDER — SERTRALINE HCL 50 MG PO TABS: ORAL_TABLET | ORAL | Status: DC

## 2014-02-02 MED ORDER — TETANUS-DIPHTH-ACELL PERTUSSIS 5-2.5-18.5 LF-MCG/0.5 IM SUSP
0.5000 mL | Freq: Once | INTRAMUSCULAR | Status: AC
  Administered 2014-02-02: 0.5 mL via INTRAMUSCULAR

## 2014-02-02 NOTE — Progress Notes (Signed)
S: Pt states that she feels well other than feeling very anxious and depressed. States she had this with previous pregnancies, but worse now. Some passive SI, most recently 2 mos ago and none currently. Feels very anxious and overwhelmed by caring for 5 children at home. Feels safe at home, denies DV. Interested in [place], took post partum w/ previous pregnancies.   + FM, no VB, no LOF, no contractions.    A/P: #) Depression - start zoloft to titrate to 100 mg/day. Pysch referral. Strict ER/MAU precautions with worsening pregnancy. Gave [facility] crisis number. Also met with SW who will see pt in [facility] weekly.  #) [place] - Reviewed PNL, all WNL. Glucola today, flu today. Anatomy scan ordered. Flu, tdap today

## 2014-02-02 NOTE — Progress Notes (Signed)
Reports pelvic pressure  Okey Regalarol used for interpreter

## 2014-02-02 NOTE — Patient Instructions (Signed)
Phone First. [facility] is available 24 hours a day, 7 days a week. Customer Service Specialists will assist you to find a crisis provider that is well-matched with your needs. Your local number is: [phone]

## 2014-02-02 NOTE — Progress Notes (Signed)
Anatomy U/S scheduled for 02/06/14 at 8:30 am.

## 2014-02-03 LAB — GLUCOSE TOLERANCE, 1 HOUR (50G) W/O FASTING: Glucose, 1 Hour GTT: 75 mg/dL (ref 70–140)

## 2014-02-04 ENCOUNTER — Encounter: Payer: Medicaid Other | Admitting: Family

## 2014-02-04 LAB — POCT URINALYSIS DIP (DEVICE)
Bilirubin Urine: NEGATIVE
GLUCOSE, UA: NEGATIVE mg/dL
Hgb urine dipstick: NEGATIVE
Ketones, ur: NEGATIVE mg/dL
NITRITE: NEGATIVE
PH: 5.5 (ref 5.0–8.0)
Protein, ur: NEGATIVE mg/dL
Specific Gravity, Urine: 1.015 (ref 1.005–1.030)
Urobilinogen, UA: 0.2 mg/dL (ref 0.0–1.0)

## 2014-02-06 ENCOUNTER — Ambulatory Visit (HOSPITAL_COMMUNITY)
Admission: RE | Admit: 2014-02-06 | Discharge: 2014-02-06 | Disposition: A | Discharge status: Home / Self Care | Payer: Self-pay | Source: Ambulatory Visit | Attending: Obstetrics and Gynecology | Admitting: Obstetrics and Gynecology

## 2014-02-06 DIAGNOSIS — Z3A27 27 weeks gestation of pregnancy: Secondary | ICD-10-CM | POA: Insufficient documentation

## 2014-02-06 DIAGNOSIS — Z3482 Encounter for supervision of other normal pregnancy, second trimester: Secondary | ICD-10-CM | POA: Insufficient documentation

## 2014-02-06 DIAGNOSIS — Z3689 Encounter for other specified antenatal screening: Secondary | ICD-10-CM | POA: Insufficient documentation

## 2014-02-18 ENCOUNTER — Ambulatory Visit (INDEPENDENT_AMBULATORY_CARE_PROVIDER_SITE_OTHER): Payer: Self-pay | Admitting: Obstetrics and Gynecology

## 2014-02-18 VITALS — BP 103/60 | HR 78 | Wt 158.2 lb

## 2014-02-18 DIAGNOSIS — O0933 Supervision of pregnancy with insufficient antenatal care, third trimester: Secondary | ICD-10-CM

## 2014-02-18 DIAGNOSIS — Z3483 Encounter for supervision of other normal pregnancy, third trimester: Secondary | ICD-10-CM

## 2014-02-18 LAB — POCT URINALYSIS DIP (DEVICE)
BILIRUBIN URINE: NEGATIVE
Glucose, UA: NEGATIVE mg/dL
HGB URINE DIPSTICK: NEGATIVE
Ketones, ur: NEGATIVE mg/dL
Nitrite: NEGATIVE
Protein, ur: NEGATIVE mg/dL
Specific Gravity, Urine: 1.015 (ref 1.005–1.030)
Urobilinogen, UA: 0.2 mg/dL (ref 0.0–1.0)
pH: 6.5 (ref 5.0–8.0)

## 2014-02-18 NOTE — Progress Notes (Signed)
Has had breast soreness on left>right for 2 months. No nipple discharge or lump. Breast exam: sym, nipples erect, gestational without discreet mass, NT, no lymphadenopathy. Continue to monitor.  Denies anxiety/depression symptoms off Zoloft (prescribed 02/02/14) and affect is appropriate. Will see SW in [place]>  F/U each visit.  Anatomic scan all nl at 27 wks.

## 2014-02-18 NOTE — Progress Notes (Signed)
Pt stopped taking zoloft. Spanish interpreter present for check in: Rhonda MollDorita Townsend.

## 2014-02-25 ENCOUNTER — Ambulatory Visit (INDEPENDENT_AMBULATORY_CARE_PROVIDER_SITE_OTHER): Payer: Self-pay | Admitting: Physician Assistant

## 2014-02-25 VITALS — BP 106/63 | HR 89 | Temp 98.3°F | Wt 160.2 lb

## 2014-02-25 DIAGNOSIS — Z3483 Encounter for supervision of other normal pregnancy, third trimester: Secondary | ICD-10-CM

## 2014-02-25 LAB — POCT URINALYSIS DIP (DEVICE)
Bilirubin Urine: NEGATIVE
Glucose, UA: NEGATIVE mg/dL
Hgb urine dipstick: NEGATIVE
KETONES UR: NEGATIVE mg/dL
NITRITE: NEGATIVE
Protein, ur: NEGATIVE mg/dL
Specific Gravity, Urine: <=1.005 (ref 1.005–1.030)
Urobilinogen, UA: 0.2 mg/dL (ref 0.0–1.0)
pH: 6 (ref 5.0–8.0)

## 2014-02-25 MED ORDER — SERTRALINE HCL 50 MG PO TABS: ORAL_TABLET | ORAL | Status: DC

## 2014-02-25 NOTE — Progress Notes (Signed)
30 weeks, stable without complaint.  Is having occ ctx.  Denies LOF, vaginal bleeding, dysuria.  Endorses good fetal movement.  PNV qd. RTC 2 weeks Pt reports she was not able to get zoloft rx from pharmacy previously and never started this medication.  She is complaining of increased depressive symptoms at this time.  Will begin medication.  Zoloft 50mg .  Take 1/2 tab x 1 week and then increase to 1 full tab daily.  She will then be back for OBF and we can re-assess.  May need to increase medication to 100mg  at some point.

## 2014-02-25 NOTE — Progress Notes (Signed)
Spanish interpreter present for encounter:Rhonda Townsend

## 2014-02-25 NOTE — Patient Instructions (Signed)
Tercer trimestre de embarazo (Third Trimester of Pregnancy) El tercer trimestre va desde la semana29 hasta la 42, desde el sptimo hasta el noveno mes, y es la poca en la que el feto crece ms rpidamente. Hacia el final del noveno mes, el feto mide alrededor de 20pulgadas (45cm) de largo y pesa entre 6 y 10 libras (2,700 y 4,500kg).  CAMBIOS EN EL ORGANISMO Su organismo atraviesa por muchos cambios durante el embarazo, y estos varan de una mujer a otra.   Seguir aumentando de peso. Es de esperar que aumente entre 25 y 35libras (11 y 16kg) hacia el final del embarazo.  Podrn aparecer las primeras estras en las caderas, el abdomen y las mamas.  Puede tener necesidad de orinar con ms frecuencia porque el feto baja hacia la pelvis y ejerce presin sobre la vejiga.  Debido al embarazo podr sentir acidez estomacal con frecuencia.  Puede estar estreida, ya que ciertas hormonas enlentecen los movimientos de los msculos que empujan los desechos a travs de los intestinos.  Pueden aparecer hemorroides o abultarse e hincharse las venas (venas varicosas).  Puede sentir dolor plvico debido al aumento de peso y a que las hormonas del embarazo relajan las articulaciones entre los huesos de la pelvis. El dolor de espalda puede ser consecuencia de la sobrecarga de los msculos que soportan la postura.  Tal vez haya cambios en el cabello que pueden incluir su engrosamiento, crecimiento rpido y cambios en la textura. Adems, a algunas mujeres se les cae el cabello durante o despus del embarazo, o tienen el cabello seco o fino. Lo ms probable es que el cabello se le normalice despus del nacimiento del beb.  Las mamas seguirn creciendo y le dolern. A veces, puede haber una secrecin amarilla de las mamas llamada calostro.  El ombligo puede salir hacia afuera.  Puede sentir que le falta el aire debido a que se expande el tero.  Puede notar que el feto "baja" o lo siente ms bajo, en el  abdomen.  Puede tener una prdida de secrecin mucosa con sangre. Esto suele ocurrir en el trmino de unos pocos das a una semana antes de que comience el trabajo de parto.  El cuello del tero se vuelve delgado y blando (se borra) cerca de la fecha de parto. QU DEBE ESPERAR EN LOS EXMENES PRENATALES  Le harn exmenes prenatales cada 2semanas hasta la semana36. A partir de ese momento le harn exmenes semanales. Durante una visita prenatal de rutina:  La pesarn para asegurarse de que usted y el feto estn creciendo normalmente.  Le tomarn la presin arterial.  Le medirn el abdomen para controlar el desarrollo del beb.  Se escucharn los latidos cardacos fetales.  Se evaluarn los resultados de los estudios solicitados en visitas anteriores.  Le revisarn el cuello del tero cuando est prxima la fecha de parto para controlar si este se ha borrado. Alrededor de la semana36, el mdico le revisar el cuello del tero. Al mismo tiempo, realizar un anlisis de las secreciones del tejido vaginal. Este examen es para determinar si hay un tipo de bacteria, estreptococo Grupo B. El mdico le explicar esto con ms detalle. El mdico puede preguntarle lo siguiente:  Cmo le gustara que fuera el parto.  Cmo se siente.  Si siente los movimientos del beb.  Si ha tenido sntomas anormales, como prdida de lquido, sangrado, dolores de cabeza intensos o clicos abdominales.  Si tiene alguna pregunta. Otros exmenes o estudios de deteccin que pueden realizarse   durante el tercer trimestre incluyen lo siguiente:  Anlisis de sangre para controlar las concentraciones de hierro (anemia).  Controles fetales para determinar su salud, nivel de actividad y crecimiento. Si tiene alguna enfermedad o hay problemas durante el embarazo, le harn estudios. FALSO TRABAJO DE PARTO Es posible que sienta contracciones leves e irregulares que finalmente desaparecen. Se llaman contracciones de  Braxton Hicks o falso trabajo de parto. Las contracciones pueden durar horas, das o incluso semanas, antes de que el verdadero trabajo de parto se inicie. Si las contracciones ocurren a intervalos regulares, se intensifican o se hacen dolorosas, lo mejor es que la revise el mdico.  SIGNOS DE TRABAJO DE PARTO   Clicos de tipo menstrual.  Contracciones cada 5minutos o menos.  Contracciones que comienzan en la parte superior del tero y se extienden hacia abajo, a la zona inferior del abdomen y la espalda.  Sensacin de mayor presin en la pelvis o dolor de espalda.  Una secrecin de mucosidad acuosa o con sangre que sale de la vagina. Si tiene alguno de estos signos antes de la semana37 del embarazo, llame a su mdico de inmediato. Debe concurrir al hospital para que la controlen inmediatamente. INSTRUCCIONES PARA EL CUIDADO EN EL HOGAR   Evite fumar, consumir hierbas, beber alcohol y tomar frmacos que no le hayan recetado. Estas sustancias qumicas afectan la formacin y el desarrollo del beb.  Siga las indicaciones del mdico en relacin con el uso de medicamentos. Durante el embarazo, hay medicamentos que son seguros de tomar y otros que no.  Haga actividad fsica solo en la forma indicada por el mdico. Sentir clicos uterinos es un buen signo para detener la actividad fsica.  Contine comiendo alimentos que sanos con regularidad.  Use un sostn que le brinde buen soporte si le duelen las mamas.  No se d baos de inmersin en agua caliente, baos turcos ni saunas.  Colquese el cinturn de seguridad cuando conduzca.  No coma carne cruda ni queso sin cocinar; evite el contacto con las bandejas sanitarias de los gatos y la tierra que estos animales usan. Estos elementos contienen grmenes que pueden causar defectos congnitos en el beb.  Tome las vitaminas prenatales.  Si est estreida, pruebe un laxante suave (si el mdico lo autoriza). Consuma ms alimentos ricos en  fibra, como vegetales y frutas frescos y cereales integrales. Beba gran cantidad de lquido para mantener la orina de tono claro o color amarillo plido.  Dese baos de asiento con agua tibia para aliviar el dolor o las molestias causadas por las hemorroides. Use una crema para las hemorroides si el mdico la autoriza.  Si tiene venas varicosas, use medias de descanso. Eleve los pies durante 15minutos, 3 o 4veces por da. Limite la cantidad de sal en su dieta.  Evite levantar objetos pesados, use zapatos de tacones bajos y mantenga una buena postura.  Descanse con las piernas elevadas si tiene calambres o dolor de cintura.  Visite a su dentista si no lo ha hecho durante el embarazo. Use un cepillo de dientes blando para higienizarse los dientes y psese el hilo dental con suavidad.  Puede seguir manteniendo relaciones sexuales, a menos que el mdico le indique lo contrario.  No haga viajes largos excepto que sea absolutamente necesario y solo con la autorizacin del mdico.  Tome clases prenatales para entender, practicar y hacer preguntas sobre el trabajo de parto y el parto.  Haga un ensayo de la partida al hospital.  Prepare el bolso que   llevar al hospital.  Prepare la habitacin del beb.  Concurra a todas las visitas prenatales segn las indicaciones de su mdico. SOLICITE ATENCIN MDICA SI:  No est segura de que est en trabajo de parto o de que ha roto la bolsa de las aguas.  Tiene mareos.  Siente clicos leves, presin en la pelvis o dolor persistente en el abdomen.  Tiene nuseas, vmitos o diarrea persistentes.  Tiene secrecin vaginal con mal olor.  Siente dolor al orinar. SOLICITE ATENCIN MDICA DE INMEDIATO SI:   Tiene fiebre.  Tiene una prdida de lquido por la vagina.  Tiene sangrado o pequeas prdidas vaginales.  Siente dolor intenso o clicos en el abdomen.  Sube o baja de peso rpidamente.  Tiene dificultad para respirar y siente dolor de  pecho.  Sbitamente se le hinchan mucho el rostro, las manos, los tobillos, los pies o las piernas.  No ha sentido los movimientos del beb durante una hora.  Siente un dolor de cabeza intenso que no se alivia con medicamentos.  Hay cambios en la visin. Document Released: 10/19/2004 Document Revised: 01/14/2013 ExitCare Patient Information 2015 [employer]. This information is not intended to replace advice given to you by your health care provider. Make sure you discuss any questions you have with your health care provider.  

## 2014-03-11 ENCOUNTER — Ambulatory Visit (INDEPENDENT_AMBULATORY_CARE_PROVIDER_SITE_OTHER): Payer: Self-pay | Admitting: Obstetrics & Gynecology

## 2014-03-11 VITALS — BP 119/67 | HR 93 | Wt 163.9 lb

## 2014-03-11 DIAGNOSIS — Z3483 Encounter for supervision of other normal pregnancy, third trimester: Secondary | ICD-10-CM

## 2014-03-11 DIAGNOSIS — O34219 Maternal care for unspecified type scar from previous cesarean delivery: Secondary | ICD-10-CM

## 2014-03-11 DIAGNOSIS — O3421 Maternal care for scar from previous cesarean delivery: Secondary | ICD-10-CM

## 2014-03-11 DIAGNOSIS — Z789 Other specified health status: Secondary | ICD-10-CM

## 2014-03-11 DIAGNOSIS — Z603 Acculturation difficulty: Secondary | ICD-10-CM

## 2014-03-11 LAB — POCT URINALYSIS DIP (DEVICE)
BILIRUBIN URINE: NEGATIVE
Glucose, UA: NEGATIVE mg/dL
HGB URINE DIPSTICK: NEGATIVE
Ketones, ur: NEGATIVE mg/dL
NITRITE: NEGATIVE
Protein, ur: NEGATIVE mg/dL
Specific Gravity, Urine: <=1.005 (ref 1.005–1.030)
UROBILINOGEN UA: 0.2 mg/dL (ref 0.0–1.0)
pH: 6 (ref 5.0–8.0)

## 2014-03-11 NOTE — Progress Notes (Signed)
Patient is Spanish-speaking only, Spanish interpreter present for this encounter. Counseled about TOLAC, had four VBACs already, desires another TOLAC.  Consent signed today for TOLAC. As for depression symptoms, she takes Zoloft 25 mg currently, advised to increase to 50 mg daily (1 tablet daily).  Depression deterioration precautions reviewed.  No other complaints or concerns.  Labor and fetal movement precautions reviewed.

## 2014-03-11 NOTE — Patient Instructions (Signed)
Parto vaginal despus de una cesrea (Vaginal Birth After Cesarean Delivery) Un parto vaginal despus de un parto por cesrea es dar a luz por la vagina despus de haber dado a luz por medio de una intervencin quirrgica. En el pasado, si una mujer tena un beb por cesrea, todos los partos posteriores deban hacerse por cesrea. Esto ya no es as. Puede ser seguro para la mam intentar un parto vaginal luego de una cesrea.  Es importante que converse con su mdico desde comienzos del embarazo de modo que pueda comprender los riesgos, beneficios y opciones. Le dar tiempo para decidir qu es lo mejor en su caso particular. La decisin final de tener un parto vaginal o por cesrea debe tomarse en conjunto, entre usted y el mdico. Cualquier cambio en su salud o la de su beb durante el embarazo puede ser motivo de un cambio de decisin respecto del parto vaginal.  LAS MUJERES QUE OPTAN POR EL PARTO VAGINAL, DEBEN CONSULTAR AL MDICO PARA ASEGURARSE DE QUE:  La cesrea anterior se haya realizado con un corte (incisin) uterino transversal (no con una incisin vertical clsica).  El canal de parto es lo suficientemente grande como para que pase el nio.  No ha sido sometida a otras operaciones del tero.  Durante el trabajo de parto, le realizarn un monitoreo fetal electrnico, en todo momento.  Habr un quirfano disponible y listo en caso de necesitar una cesrea de emergencia.  Un mdico y personal de quirfano estarn disponibles en todo momento durante el trabajo de parto, para realizar una cesrea en caso de ser necesario.  Habr un anestesista disponible en caso de necesitar una cesrea de emergencia.  La nursery est lista y cuenta con personal especializado y el equipo disponible para cuidar al beb en caso de emergencia. BENEFICIOS DEL PARTO VAGINAL:  Permanencia ms breve en el hospital.  Prevencin de los riesgos asociados con el parto por cesrea, por ejemplo:  Complicaciones  quirrgicas, como apertura o hernia de la incisin.  Lesiones en otros rganos.  Fiebre. Esto puede ocurrir si aparece una infeccin despus de la ciruga. Tambin puede ocurrir como reaccin a los medicamentos administrados para adormecerla durante la ciruga.  Menos prdida de sangre y menos probabilidad de necesitar una transfusin sangunea.  Menor riesgo de cogulos sanguneos e infeccin.  Tiempo ms corto de recuperacin.  Menor riesgo de remocin del tero (histerectoma).  Menor riesgo de que la placenta cubra parcial o completamente la abertura del tero (placenta previa) en embarazos futuros.  Menos riesgos en el trabajo de parto y el parto futuros. RIESGOS  Ruptura del tero. Esto ocurre en menos del 1% de los partos vaginales. El riesgo de que eso suceda es mayor si:  Se toman medidas para iniciar el proceso del trabajo de parto (inducir el parto) o estimular o intensificar las contracciones (aumentar el trabajo de parto).  Se usan medicamentos para ablandar (madurar) el cuello del tero.  Es necesario extraer el tero (histerectoma) si se rompe. No debe llevarse a cabo si:  La cesrea previa se realiz con una incisin vertical (clsica) o con forma de T, o usted no sabe cul de ellas le han practicado.  Ha sufrido ruptura del tero.  Ha tenido ciertos tipos de ciruga en el tero, como la extirpacin de fibromas uterinos. Pregntele a su mdico sobre otros tipos de cirugas que le impiden tener un parto vaginal.  Tiene ciertos problemas mdicos o relacionados con el parto (obsttricos).  El beb est en   problemas.  Tuvo dos cesreas previas y ningn parto vaginal. OTRAS COSAS QUE DEBE SABER:  La anestesia peridural es segura.  Es seguro dar vuelta al beb si se encuentra de nalgas (intentar una versin ceflica externa).  Es seguro intentarlo en caso de mellizos.  El parto vaginal puede no ser apropiado si el beb pesa 8,8lb (4kg) o ms. Sin embargo,  las predicciones de peso no son siempre exactas y no deben ser lo nico a tenerse en cuenta para decidir si el parto vaginal es lo indicado para usted.  Hay aumento en el porcentaje de fracasos si el intervalo entre la cesrea y el parto vaginal es de menos de 19 meses.  Su mdico puede aconsejarle no tener un parto vaginal si tiene preeclampsia (hipertensin, protena en la orina e hinchazn en la cara y las extremidades).  El parto vaginal suele ser exitoso si ya tuvo un parto vaginal previamente.  Tambin suele ser exitoso cuando el trabajo de parto comienza espontneamente antes de la fecha.  El parto vaginal despus de una cesrea es similar a un parto espontneo vaginal normal. Document Released: 06/28/2007 Document Revised: 10/30/2012 ExitCare Patient Information 2015 [employer]. This information is not intended to replace advice given to you by your health care provider. Make sure you discuss any questions you have with your health care provider.  

## 2014-03-17 ENCOUNTER — Encounter: Payer: Self-pay | Admitting: *Deleted

## 2014-03-25 ENCOUNTER — Encounter: Payer: Self-pay | Admitting: Physician Assistant

## 2014-03-25 ENCOUNTER — Ambulatory Visit (INDEPENDENT_AMBULATORY_CARE_PROVIDER_SITE_OTHER): Payer: Self-pay | Admitting: Physician Assistant

## 2014-03-25 VITALS — BP 100/58 | HR 82 | Wt 165.1 lb

## 2014-03-25 DIAGNOSIS — Z3483 Encounter for supervision of other normal pregnancy, third trimester: Secondary | ICD-10-CM

## 2014-03-25 DIAGNOSIS — L309 Dermatitis, unspecified: Secondary | ICD-10-CM

## 2014-03-25 LAB — POCT URINALYSIS DIP (DEVICE)
BILIRUBIN URINE: NEGATIVE
Glucose, UA: NEGATIVE mg/dL
Hgb urine dipstick: NEGATIVE
KETONES UR: NEGATIVE mg/dL
NITRITE: NEGATIVE
PH: 7.5 (ref 5.0–8.0)
Protein, ur: NEGATIVE mg/dL
Specific Gravity, Urine: 1.015 (ref 1.005–1.030)
Urobilinogen, UA: 0.2 mg/dL (ref 0.0–1.0)

## 2014-03-25 MED ORDER — NYSTATIN-TRIAMCINOLONE 100000-0.1 UNIT/GM-% EX CREA: 1.0000 "application " | TOPICAL_CREAM | Freq: Two times a day (BID) | CUTANEOUS | Status: DC

## 2014-03-25 NOTE — Progress Notes (Signed)
34 weeks, stable.  Notes itching at nipples.  Has tiny raised bumps within the areola bilaterally.   Notes good fetal movement.  Denies LOF, vag discharge, dysuria.   PNV qd Urine cx pending Increase po fluids Nystatin cream to nipples RTC 1 wekk

## 2014-03-25 NOTE — Patient Instructions (Signed)
Contracciones de Braxton Hicks °(Braxton Hicks Contractions) °Durante el embarazo, pueden presentarse contracciones uterinas que no siempre indican que está en trabajo de parto.  °¿QUÉ SON LAS CONTRACCIONES DE BRAXTON HICKS?  °Las contracciones que se presentan antes del trabajo de parto se conocen como contracciones de Braxton Hicks o falso trabajo de parto. Hacia el final del embarazo (32 a 34 semanas), estas contracciones pueden aparecen con más frecuencia y volverse más intensas. No corresponden al trabajo de parto verdadero porque estas contracciones no producen el agrandamiento (la dilatación) y el afinamiento del cuello del útero. Algunas veces, es difícil distinguirlas del trabajo de parto verdadero porque en algunos casos pueden ser muy intensas, y las personas tienen diferentes niveles de tolerancia al dolor. No debe sentirse avergonzada si concurre al hospital con falso trabajo de parto. En ocasiones, la única forma de saber si el trabajo de parto es verdadero es que el médico determine si hay cambios en el cuello del útero. °Si no hay problemas prenatales u otras complicaciones de salud asociadas con el embarazo, no habrá inconvenientes si la envían a su casa con falso trabajo de parto y espera que comience el verdadero. °CÓMO DIFERENCIAR EL TRABAJO DE PARTO FALSO DEL VERDADERO °Falso trabajo de parto °· Las contracciones del falso trabajo de parto duran menos y no son tan intensas como las verdaderas. °· Generalmente son irregulares. °· A menudo, se sienten en la parte delantera de la parte baja del abdomen y en la ingle, °· y pueden desaparecer cuando camina o cambia de posición mientras está acostada. °· Las contracciones se vuelven más débiles y su duración es menor a medida que el tiempo transcurre. °· Por lo general, no se hacen progresivamente más intensas, regulares y cercanas entre sí como en el caso del trabajo de parto verdadero. °Verdadero trabajo de parto °· Las contracciones del verdadero  trabajo de parto duran de 30 a 70 segundos, son muy regulares y suelen volverse más intensas, y aumenta su frecuencia. °· No desaparecen cuando camina. °· La molestia generalmente se siente en la parte superior del útero y se extiende hacia la zona inferior del abdomen y hacia la cintura. °· El médico podrá examinarla para determinar si el trabajo de parto es verdadero. El examen mostrará si el cuello del útero se está dilatando y afinando. °LO QUE DEBE RECORDAR °· Continúe haciendo los ejercicios habituales y siga otras indicaciones que el médico le dé. °· Tome todos los medicamentos como le indicó el médico. °· Concurra a las visitas prenatales regulares. °· Coma y beba con moderación si cree que está en trabajo de parto. °· Si las contracciones de Braxton Hicks le provocan incomodidad: °¨ Cambie de posición: si está acostada o descansando, camine; si está caminando, descanse. °¨ Siéntese y descanse en una bañera con agua tibia. °¨ Beba 2 o 3 vasos de agua. La deshidratación puede provocar contracciones. °¨ Respire lenta y profundamente varias veces por hora. °¿CUÁNDO DEBO BUSCAR ASISTENCIA MÉDICA INMEDIATA? °Solicite atención médica de inmediato si: °· Las contracciones se intensifican, se hacen más regulares y cercanas entre sí. °· Tiene una pérdida de líquido por la vagina. °· Tiene fiebre. °· Elimina mucosidad manchada con sangre. °· Tiene una hemorragia vaginal abundante. °· Tiene dolor abdominal permanente. °· Tiene un dolor en la zona lumbar que nunca tuvo antes. °· Siente que la cabeza del bebé empuja hacia abajo y ejerce presión en la zona pélvica. °· El bebé no se mueve tanto como solía. °Document Released: 10/19/2004 Document Revised: 01/14/2013 °ExitCare® Patient   Information ©2015 [employer]. This information is not intended to replace advice given to you by your health care provider. Make sure you discuss any questions you have with your health care provider. ° °

## 2014-03-27 LAB — CULTURE, OB URINE

## 2014-03-31 ENCOUNTER — Ambulatory Visit (INDEPENDENT_AMBULATORY_CARE_PROVIDER_SITE_OTHER): Payer: Self-pay | Admitting: Obstetrics & Gynecology

## 2014-03-31 ENCOUNTER — Encounter: Payer: Self-pay | Admitting: Obstetrics & Gynecology

## 2014-03-31 VITALS — BP 112/69 | HR 84 | Temp 98.3°F | Wt 168.0 lb

## 2014-03-31 DIAGNOSIS — Z3483 Encounter for supervision of other normal pregnancy, third trimester: Secondary | ICD-10-CM

## 2014-03-31 LAB — POCT URINALYSIS DIP (DEVICE)
Bilirubin Urine: NEGATIVE
GLUCOSE, UA: NEGATIVE mg/dL
HGB URINE DIPSTICK: NEGATIVE
Ketones, ur: NEGATIVE mg/dL
Nitrite: NEGATIVE
Protein, ur: NEGATIVE mg/dL
Specific Gravity, Urine: 1.015 (ref 1.005–1.030)
UROBILINOGEN UA: 0.2 mg/dL (ref 0.0–1.0)
pH: 7 (ref 5.0–8.0)

## 2014-03-31 NOTE — Progress Notes (Signed)
Spanish Interpreter for encounter Rhonda Townsend No concerns today.

## 2014-03-31 NOTE — Patient Instructions (Signed)
Tercer trimestre de embarazo (Third Trimester of Pregnancy) El tercer trimestre va desde la semana29 hasta la 42, desde el sptimo hasta el noveno mes, y es la poca en la que el feto crece ms rpidamente. Hacia el final del noveno mes, el feto mide alrededor de 20pulgadas (45cm) de largo y pesa entre 6 y 10 libras (2,700 y 4,500kg).  CAMBIOS EN EL ORGANISMO Su organismo atraviesa por muchos cambios durante el embarazo, y estos varan de una mujer a otra.   Seguir aumentando de peso. Es de esperar que aumente entre 25 y 35libras (11 y 16kg) hacia el final del embarazo.  Podrn aparecer las primeras estras en las caderas, el abdomen y las mamas.  Puede tener necesidad de orinar con ms frecuencia porque el feto baja hacia la pelvis y ejerce presin sobre la vejiga.  Debido al embarazo podr sentir acidez estomacal con frecuencia.  Puede estar estreida, ya que ciertas hormonas enlentecen los movimientos de los msculos que empujan los desechos a travs de los intestinos.  Pueden aparecer hemorroides o abultarse e hincharse las venas (venas varicosas).  Puede sentir dolor plvico debido al aumento de peso y a que las hormonas del embarazo relajan las articulaciones entre los huesos de la pelvis. El dolor de espalda puede ser consecuencia de la sobrecarga de los msculos que soportan la postura.  Tal vez haya cambios en el cabello que pueden incluir su engrosamiento, crecimiento rpido y cambios en la textura. Adems, a algunas mujeres se les cae el cabello durante o despus del embarazo, o tienen el cabello seco o fino. Lo ms probable es que el cabello se le normalice despus del nacimiento del beb.  Las mamas seguirn creciendo y le dolern. A veces, puede haber una secrecin amarilla de las mamas llamada calostro.  El ombligo puede salir hacia afuera.  Puede sentir que le falta el aire debido a que se expande el tero.  Puede notar que el feto "baja" o lo siente ms bajo, en el  abdomen.  Puede tener una prdida de secrecin mucosa con sangre. Esto suele ocurrir en el trmino de unos pocos das a una semana antes de que comience el trabajo de parto.  El cuello del tero se vuelve delgado y blando (se borra) cerca de la fecha de parto. QU DEBE ESPERAR EN LOS EXMENES PRENATALES  Le harn exmenes prenatales cada 2semanas hasta la semana36. A partir de ese momento le harn exmenes semanales. Durante una visita prenatal de rutina:  La pesarn para asegurarse de que usted y el feto estn creciendo normalmente.  Le tomarn la presin arterial.  Le medirn el abdomen para controlar el desarrollo del beb.  Se escucharn los latidos cardacos fetales.  Se evaluarn los resultados de los estudios solicitados en visitas anteriores.  Le revisarn el cuello del tero cuando est prxima la fecha de parto para controlar si este se ha borrado. Alrededor de la semana36, el mdico le revisar el cuello del tero. Al mismo tiempo, realizar un anlisis de las secreciones del tejido vaginal. Este examen es para determinar si hay un tipo de bacteria, estreptococo Grupo B. El mdico le explicar esto con ms detalle. El mdico puede preguntarle lo siguiente:  Cmo le gustara que fuera el parto.  Cmo se siente.  Si siente los movimientos del beb.  Si ha tenido sntomas anormales, como prdida de lquido, sangrado, dolores de cabeza intensos o clicos abdominales.  Si tiene alguna pregunta. Otros exmenes o estudios de deteccin que pueden realizarse   durante el tercer trimestre incluyen lo siguiente:  Anlisis de sangre para controlar las concentraciones de hierro (anemia).  Controles fetales para determinar su salud, nivel de actividad y crecimiento. Si tiene alguna enfermedad o hay problemas durante el embarazo, le harn estudios. FALSO TRABAJO DE PARTO Es posible que sienta contracciones leves e irregulares que finalmente desaparecen. Se llaman contracciones de  Braxton Hicks o falso trabajo de parto. Las contracciones pueden durar horas, das o incluso semanas, antes de que el verdadero trabajo de parto se inicie. Si las contracciones ocurren a intervalos regulares, se intensifican o se hacen dolorosas, lo mejor es que la revise el mdico.  SIGNOS DE TRABAJO DE PARTO   Clicos de tipo menstrual.  Contracciones cada 5minutos o menos.  Contracciones que comienzan en la parte superior del tero y se extienden hacia abajo, a la zona inferior del abdomen y la espalda.  Sensacin de mayor presin en la pelvis o dolor de espalda.  Una secrecin de mucosidad acuosa o con sangre que sale de la vagina. Si tiene alguno de estos signos antes de la semana37 del embarazo, llame a su mdico de inmediato. Debe concurrir al hospital para que la controlen inmediatamente. INSTRUCCIONES PARA EL CUIDADO EN EL HOGAR   Evite fumar, consumir hierbas, beber alcohol y tomar frmacos que no le hayan recetado. Estas sustancias qumicas afectan la formacin y el desarrollo del beb.  Siga las indicaciones del mdico en relacin con el uso de medicamentos. Durante el embarazo, hay medicamentos que son seguros de tomar y otros que no.  Haga actividad fsica solo en la forma indicada por el mdico. Sentir clicos uterinos es un buen signo para detener la actividad fsica.  Contine comiendo alimentos que sanos con regularidad.  Use un sostn que le brinde buen soporte si le duelen las mamas.  No se d baos de inmersin en agua caliente, baos turcos ni saunas.  Colquese el cinturn de seguridad cuando conduzca.  No coma carne cruda ni queso sin cocinar; evite el contacto con las bandejas sanitarias de los gatos y la tierra que estos animales usan. Estos elementos contienen grmenes que pueden causar defectos congnitos en el beb.  Tome las vitaminas prenatales.  Si est estreida, pruebe un laxante suave (si el mdico lo autoriza). Consuma ms alimentos ricos en  fibra, como vegetales y frutas frescos y cereales integrales. Beba gran cantidad de lquido para mantener la orina de tono claro o color amarillo plido.  Dese baos de asiento con agua tibia para aliviar el dolor o las molestias causadas por las hemorroides. Use una crema para las hemorroides si el mdico la autoriza.  Si tiene venas varicosas, use medias de descanso. Eleve los pies durante 15minutos, 3 o 4veces por da. Limite la cantidad de sal en su dieta.  Evite levantar objetos pesados, use zapatos de tacones bajos y mantenga una buena postura.  Descanse con las piernas elevadas si tiene calambres o dolor de cintura.  Visite a su dentista si no lo ha hecho durante el embarazo. Use un cepillo de dientes blando para higienizarse los dientes y psese el hilo dental con suavidad.  Puede seguir manteniendo relaciones sexuales, a menos que el mdico le indique lo contrario.  No haga viajes largos excepto que sea absolutamente necesario y solo con la autorizacin del mdico.  Tome clases prenatales para entender, practicar y hacer preguntas sobre el trabajo de parto y el parto.  Haga un ensayo de la partida al hospital.  Prepare el bolso que   llevar al hospital.  Prepare la habitacin del beb.  Concurra a todas las visitas prenatales segn las indicaciones de su mdico. SOLICITE ATENCIN MDICA SI:  No est segura de que est en trabajo de parto o de que ha roto la bolsa de las aguas.  Tiene mareos.  Siente clicos leves, presin en la pelvis o dolor persistente en el abdomen.  Tiene nuseas, vmitos o diarrea persistentes.  Tiene secrecin vaginal con mal olor.  Siente dolor al orinar. SOLICITE ATENCIN MDICA DE INMEDIATO SI:   Tiene fiebre.  Tiene una prdida de lquido por la vagina.  Tiene sangrado o pequeas prdidas vaginales.  Siente dolor intenso o clicos en el abdomen.  Sube o baja de peso rpidamente.  Tiene dificultad para respirar y siente dolor de  pecho.  Sbitamente se le hinchan mucho el rostro, las manos, los tobillos, los pies o las piernas.  No ha sentido los movimientos del beb durante una hora.  Siente un dolor de cabeza intenso que no se alivia con medicamentos.  Hay cambios en la visin. Document Released: 10/19/2004 Document Revised: 01/14/2013 ExitCare Patient Information 2015 [employer]. This information is not intended to replace advice given to you by your health care provider. Make sure you discuss any questions you have with your health care provider.  

## 2014-03-31 NOTE — Progress Notes (Signed)
Pt with no problems.  Plans VBAC. occ ctx.  NO LOF or VB Cx next visit

## 2014-04-06 ENCOUNTER — Ambulatory Visit (INDEPENDENT_AMBULATORY_CARE_PROVIDER_SITE_OTHER): Payer: Self-pay | Admitting: Family Medicine

## 2014-04-06 VITALS — BP 119/61 | HR 87 | Wt 169.3 lb

## 2014-04-06 DIAGNOSIS — O3421 Maternal care for scar from previous cesarean delivery: Secondary | ICD-10-CM

## 2014-04-06 DIAGNOSIS — O34219 Maternal care for unspecified type scar from previous cesarean delivery: Secondary | ICD-10-CM

## 2014-04-06 LAB — POCT URINALYSIS DIP (DEVICE)
Bilirubin Urine: NEGATIVE
Glucose, UA: NEGATIVE mg/dL
Hgb urine dipstick: NEGATIVE
KETONES UR: NEGATIVE mg/dL
Nitrite: NEGATIVE
PH: 6.5 (ref 5.0–8.0)
PROTEIN: NEGATIVE mg/dL
SPECIFIC GRAVITY, URINE: 1.02 (ref 1.005–1.030)
Urobilinogen, UA: 0.2 mg/dL (ref 0.0–1.0)

## 2014-04-06 LAB — OB RESULTS CONSOLE GC/CHLAMYDIA
Chlamydia: NEGATIVE
Gonorrhea: NEGATIVE

## 2014-04-06 LAB — CULTURE, BETA STREP (GROUP B ONLY): STREPTOCOCCUS GROUP B (STREPTOCOCCUS AGALACTIAE): NEGATIVE

## 2014-04-06 LAB — OB RESULTS CONSOLE GBS: STREP GROUP B AG: NEGATIVE

## 2014-04-06 NOTE — Progress Notes (Signed)
Patient is 34 y.o. [id number].  +FM, denies LOF, VB, vaginal discharge.  Overall feeling well.  Has noted some contractions irregular. - GBS and G/C done today

## 2014-04-07 LAB — GC/CHLAMYDIA PROBE AMP
CT Probe RNA: NEGATIVE
GC Probe RNA: NEGATIVE

## 2014-04-07 LAB — LAB REPORT - SCANNED
Chlamydia DNA Probe w/Rflx: NEGATIVE
N GONORRHOEAE DNA PROBE W/RFLX: NEGATIVE

## 2014-04-08 LAB — CULTURE, BETA STREP (GROUP B ONLY)

## 2014-04-14 ENCOUNTER — Ambulatory Visit (INDEPENDENT_AMBULATORY_CARE_PROVIDER_SITE_OTHER): Payer: Self-pay | Admitting: Obstetrics & Gynecology

## 2014-04-14 VITALS — BP 114/65 | HR 92 | Temp 98.3°F | Wt 171.5 lb

## 2014-04-14 DIAGNOSIS — Z3483 Encounter for supervision of other normal pregnancy, third trimester: Secondary | ICD-10-CM

## 2014-04-14 LAB — POCT URINALYSIS DIP (DEVICE)
BILIRUBIN URINE: NEGATIVE
Glucose, UA: NEGATIVE mg/dL
Hgb urine dipstick: NEGATIVE
Ketones, ur: NEGATIVE mg/dL
NITRITE: NEGATIVE
PH: 7 (ref 5.0–8.0)
Protein, ur: NEGATIVE mg/dL
Specific Gravity, Urine: 1.015 (ref 1.005–1.030)
Urobilinogen, UA: 0.2 mg/dL (ref 0.0–1.0)

## 2014-04-14 NOTE — Progress Notes (Signed)
occasional contractions, no VB or ROM, precautions given

## 2014-04-14 NOTE — Progress Notes (Signed)
[place] Rhonda Townsend used for interpreter

## 2014-04-21 ENCOUNTER — Encounter: Payer: Self-pay | Admitting: *Deleted

## 2014-04-21 ENCOUNTER — Encounter: Payer: Self-pay | Admitting: Obstetrics & Gynecology

## 2014-04-27 ENCOUNTER — Ambulatory Visit (INDEPENDENT_AMBULATORY_CARE_PROVIDER_SITE_OTHER): Payer: Self-pay | Admitting: Advanced Practice Midwife

## 2014-04-27 VITALS — BP 119/70 | HR 95 | Temp 98.2°F | Wt 175.8 lb

## 2014-04-27 DIAGNOSIS — O3421 Maternal care for scar from previous cesarean delivery: Secondary | ICD-10-CM

## 2014-04-27 DIAGNOSIS — O34219 Maternal care for unspecified type scar from previous cesarean delivery: Secondary | ICD-10-CM

## 2014-04-27 LAB — POCT URINALYSIS DIP (DEVICE)
BILIRUBIN URINE: NEGATIVE
Glucose, UA: NEGATIVE mg/dL
Hgb urine dipstick: NEGATIVE
KETONES UR: NEGATIVE mg/dL
Nitrite: NEGATIVE
Protein, ur: NEGATIVE mg/dL
Specific Gravity, Urine: 1.01 (ref 1.005–1.030)
Urobilinogen, UA: 0.2 mg/dL (ref 0.0–1.0)
pH: 6.5 (ref 5.0–8.0)

## 2014-04-27 NOTE — Progress Notes (Signed)
Doing well.  Good fetal movement, denies vaginal bleeding, LOF, contractions every 10-12 minutes, mild.  Desires cervical exam.  Reviewed signs of labor/reasons to come to hospital.

## 2014-04-27 NOTE — Progress Notes (Signed)
Moderate Leukocytes  Delorise RoyalsJulie Sowell Spanish Interpreter

## 2014-05-06 ENCOUNTER — Ambulatory Visit (INDEPENDENT_AMBULATORY_CARE_PROVIDER_SITE_OTHER): Payer: Self-pay | Admitting: Certified Nurse Midwife

## 2014-05-06 ENCOUNTER — Inpatient Hospital Stay (HOSPITAL_COMMUNITY)
Admission: AD | Admit: 2014-05-06 | Discharge: 2014-05-08 | DRG: 775 | Disposition: A | Discharge status: Home / Self Care | Payer: Medicaid Other | Source: Ambulatory Visit | Attending: Obstetrics and Gynecology | Admitting: Obstetrics and Gynecology

## 2014-05-06 ENCOUNTER — Encounter (HOSPITAL_COMMUNITY): Payer: Self-pay | Admitting: *Deleted

## 2014-05-06 VITALS — BP 126/80 | HR 82 | Wt 178.9 lb

## 2014-05-06 DIAGNOSIS — F329 Major depressive disorder, single episode, unspecified: Secondary | ICD-10-CM | POA: Diagnosis present

## 2014-05-06 DIAGNOSIS — O4290 Premature rupture of membranes, unspecified as to length of time between rupture and onset of labor, unspecified weeks of gestation: Secondary | ICD-10-CM | POA: Diagnosis present

## 2014-05-06 DIAGNOSIS — O3421 Maternal care for scar from previous cesarean delivery: Principal | ICD-10-CM | POA: Diagnosis present

## 2014-05-06 DIAGNOSIS — O99344 Other mental disorders complicating childbirth: Secondary | ICD-10-CM | POA: Diagnosis present

## 2014-05-06 DIAGNOSIS — Z79899 Other long term (current) drug therapy: Secondary | ICD-10-CM

## 2014-05-06 DIAGNOSIS — Z3A40 40 weeks gestation of pregnancy: Secondary | ICD-10-CM

## 2014-05-06 DIAGNOSIS — F419 Anxiety disorder, unspecified: Secondary | ICD-10-CM | POA: Diagnosis present

## 2014-05-06 DIAGNOSIS — Z3493 Encounter for supervision of normal pregnancy, unspecified, third trimester: Secondary | ICD-10-CM

## 2014-05-06 LAB — POCT URINALYSIS DIP (DEVICE)
Bilirubin Urine: NEGATIVE
Glucose, UA: NEGATIVE mg/dL
Hgb urine dipstick: NEGATIVE
Ketones, ur: NEGATIVE mg/dL
Nitrite: NEGATIVE
Protein, ur: NEGATIVE mg/dL
Specific Gravity, Urine: 1.015 (ref 1.005–1.030)
Urobilinogen, UA: 0.2 mg/dL (ref 0.0–1.0)
pH: 7 (ref 5.0–8.0)

## 2014-05-06 MED ORDER — LACTATED RINGERS IV SOLN
INTRAVENOUS | Status: DC
  Administered 2014-05-06 – 2014-05-07 (×2): via INTRAVENOUS

## 2014-05-06 NOTE — Progress Notes (Signed)
Leukocytes: small 

## 2014-05-06 NOTE — Patient Instructions (Signed)
Parto vaginal despus de una cesrea (Vaginal Birth After Cesarean Delivery) Un parto vaginal despus de un parto por cesrea es dar a luz por la vagina despus de haber dado a luz por medio de una intervencin quirrgica. En el pasado, si una mujer tena un beb por cesrea, todos los partos posteriores deban hacerse por cesrea. Esto ya no es as. Puede ser seguro para la mam intentar un parto vaginal luego de una cesrea.  Es importante que converse con su mdico desde comienzos del embarazo de modo que pueda comprender los riesgos, beneficios y opciones. Le dar tiempo para decidir qu es lo mejor en su caso particular. La decisin final de tener un parto vaginal o por cesrea debe tomarse en conjunto, entre usted y el mdico. Cualquier cambio en su salud o la de su beb durante el embarazo puede ser motivo de un cambio de decisin respecto del parto vaginal.  LAS MUJERES QUE OPTAN POR EL PARTO VAGINAL, DEBEN CONSULTAR AL MDICO PARA ASEGURARSE DE QUE:  La cesrea anterior se haya realizado con un corte (incisin) uterino transversal (no con una incisin vertical clsica).  El canal de parto es lo suficientemente grande como para que pase el nio.  No ha sido sometida a otras operaciones del tero.  Durante el trabajo de parto, le realizarn un monitoreo fetal electrnico, en todo momento.  Habr un quirfano disponible y listo en caso de necesitar una cesrea de emergencia.  Un mdico y personal de quirfano estarn disponibles en todo momento durante el trabajo de parto, para realizar una cesrea en caso de ser necesario.  Habr un anestesista disponible en caso de necesitar una cesrea de emergencia.  La nursery est lista y cuenta con personal especializado y el equipo disponible para cuidar al beb en caso de emergencia. BENEFICIOS DEL PARTO VAGINAL:  Permanencia ms breve en el hospital.  Prevencin de los riesgos asociados con el parto por cesrea, por ejemplo:  Complicaciones  quirrgicas, como apertura o hernia de la incisin.  Lesiones en otros rganos.  Fiebre. Esto puede ocurrir si aparece una infeccin despus de la ciruga. Tambin puede ocurrir como reaccin a los medicamentos administrados para adormecerla durante la ciruga.  Menos prdida de sangre y menos probabilidad de necesitar una transfusin sangunea.  Menor riesgo de cogulos sanguneos e infeccin.  Tiempo ms corto de recuperacin.  Menor riesgo de remocin del tero (histerectoma).  Menor riesgo de que la placenta cubra parcial o completamente la abertura del tero (placenta previa) en embarazos futuros.  Menos riesgos en el trabajo de parto y el parto futuros. RIESGOS  Ruptura del tero. Esto ocurre en menos del 1% de los partos vaginales. El riesgo de que eso suceda es mayor si:  Se toman medidas para iniciar el proceso del trabajo de parto (inducir el parto) o estimular o intensificar las contracciones (aumentar el trabajo de parto).  Se usan medicamentos para ablandar (madurar) el cuello del tero.  Es necesario extraer el tero (histerectoma) si se rompe. No debe llevarse a cabo si:  La cesrea previa se realiz con una incisin vertical (clsica) o con forma de T, o usted no sabe cul de ellas le han practicado.  Ha sufrido ruptura del tero.  Ha tenido ciertos tipos de ciruga en el tero, como la extirpacin de fibromas uterinos. Pregntele a su mdico sobre otros tipos de cirugas que le impiden tener un parto vaginal.  Tiene ciertos problemas mdicos o relacionados con el parto (obsttricos).  El beb est en   problemas.  Tuvo dos cesreas previas y ningn parto vaginal. OTRAS COSAS QUE DEBE SABER:  La anestesia peridural es segura.  Es seguro dar vuelta al beb si se encuentra de nalgas (intentar una versin ceflica externa).  Es seguro intentarlo en caso de mellizos.  El parto vaginal puede no ser apropiado si el beb pesa 8,8lb (4kg) o ms. Sin embargo,  las predicciones de peso no son siempre exactas y no deben ser lo nico a tenerse en cuenta para decidir si el parto vaginal es lo indicado para usted.  Hay aumento en el porcentaje de fracasos si el intervalo entre la cesrea y el parto vaginal es de menos de 19 meses.  Su mdico puede aconsejarle no tener un parto vaginal si tiene preeclampsia (hipertensin, protena en la orina e hinchazn en la cara y las extremidades).  El parto vaginal suele ser exitoso si ya tuvo un parto vaginal previamente.  Tambin suele ser exitoso cuando el trabajo de parto comienza espontneamente antes de la fecha.  El parto vaginal despus de una cesrea es similar a un parto espontneo vaginal normal. Document Released: 06/28/2007 Document Revised: 10/30/2012 ExitCare Patient Information 2015 [employer]. This information is not intended to replace advice given to you by your health care provider. Make sure you discuss any questions you have with your health care provider.  

## 2014-05-06 NOTE — Progress Notes (Signed)
AFI and BPP this week. Pt is having contractions every 8-10 minutes today that are painful Labor precautions given Pt is agreeable to POC.

## 2014-05-06 NOTE — H&P (Signed)
LABOR ADMISSION HISTORY AND PHYSICAL  Rhonda Townsend is a 34 y.o. female [phone] with IUP at [redacted]w[redacted]d by LMP presenting for SROM. She noticed fluid leakage earlier this evening, after a routine prenatal care appt this morning where there were no concerns. She has been experiencing CTX every 5 minutes She reports +FMs, No LOF, no VB, no blurry vision, headaches. She plans on breast and bottle feeding. She requests BTL for birth control. Of note, pt has a hx of depression and regularly takes Zoloft, though she discontinued use one week ago.  Dating: By LMP --->  Estimated Date of Delivery: 05/05/14  Sono:    , CWD, normal anatomy, cephalic presentation, 983g, 38% EFW   Prenatal History/Complications:  Prenatal Transfer Tool  Maternal Diabetes: No Genetic Screening: Declined Maternal Ultrasounds/Referrals: Normal Fetal Ultrasounds or other Referrals:  None Maternal Substance Abuse:  No Significant Maternal Medications:  Meds include: Zoloft Significant Maternal Lab Results: None    Clinic LROB Prenatal Labs  Dating U/S Blood type:     Genetic Screen Too late Antibody:   Anatomic [place] Normal Rubella: 14.80 (12/30 0300)  GTT Early:               Third trimester: 75 RPR: NON REAC (12/30 0300)   Flu vaccine   02/02/14 HBsAg: NEGATIVE (12/30 0300)   TDaP vaccine   02/02/14                                            Rhogam: HIV: NONREACTIVE (12/20 1740)   GBS       Negative                      (For PCN allergy, check sensitivities) AVW:[id number] (03/14 0000)  Contraception BTL Pap:  Baby Food Breast   Circumcision Declined   Pediatrician    Support Person      Past Medical History: Past Medical History  Diagnosis Date  . Tachycardia 2014  . Medical history non-contributory   . Blood transfusion without reported diagnosis     pp hemorrhage    Past Surgical History: Past Surgical History  Procedure Laterality Date  . Cesarean section      2003 (1st preg in [place])     Obstetrical History: OB History    Gravida Para Term Preterm AB TAB SAB Ectopic Multiple Living   0 0 0 0 0 5      Social History: History   Social History  . Marital Status: Married    Spouse Name: N/A  . Number of Children: N/A  . Years of Education: N/A   Social History Main Topics  . Smoking status: Never Smoker   . Smokeless tobacco: Never Used  . Alcohol Use: No  . Drug Use: No  . Sexual Activity: Yes   Other Topics Concern  . None   Social History Narrative    Family History: Family History  Problem Relation Age of Onset  . Alcohol abuse Neg Hx   . Arthritis Neg Hx   . Asthma Neg Hx   . Birth defects Neg Hx   . Cancer Neg Hx   . COPD Neg Hx   . Depression Neg Hx   . Diabetes Neg Hx   . Drug abuse Neg Hx   . Early death Neg Hx   . Hearing  loss Neg Hx   . Heart disease Neg Hx   . Hypertension Neg Hx   . Hyperlipidemia Neg Hx   . Kidney disease Neg Hx   . Learning disabilities Neg Hx   . Mental illness Neg Hx   . Mental retardation Neg Hx   . Miscarriages / Stillbirths Neg Hx   . Stroke Neg Hx   . Vision loss Neg Hx     Allergies: No Known Allergies  Prescriptions prior to admission  Medication Sig Dispense Refill Last Dose  . nystatin-triamcinolone (MYCOLOG II) cream Apply 1 application topically 2 (two) times daily. (Patient not taking: Reported on 05/06/2014) 30 g 0 Not Taking  . prenatal vitamin w/FE, FA (PRENATAL 1 + 1) 27-1 MG TABS tablet Take 1 tablet by mouth daily at 12 noon.   Taking  . sertraline (ZOLOFT) 50 MG tablet Take 1/2 tab (25 mg) by mouth daily x 1 wk, then 1 tab (50 mg) PO q day (Patient not taking: Reported on 05/06/2014) 30 tablet 0 Not Taking     Review of Systems   All systems reviewed and negative except as stated in HPI  Blood pressure 134/80, pulse 79, temperature 98.1 F (36.7 C), temperature source Oral, resp. rate 18, height 5\' 1"  (1.549 m), weight 81.194 kg (179 lb), last menstrual period  07/29/2013, SpO2 100 %. General appearance: alert, cooperative and no distress Lungs: clear to auscultation bilaterally Heart: regular rate and rhythm Abdomen: soft, non-tender; bowel sounds normal Pelvic: Pooling noted Extremities: no sign of DVT Presentation: cephalic Fetal monitoringBaseline: 125 bpm, Variability: Good {> 6 bpm), Accelerations: Reactive and Decelerations: Absent Uterine activityDate/time of onset: 05/07/14, Frequency: Every 5 minutes and Intensity: strong Dilation: 4 Effacement (%): 70 Station: -3 Exam by:: L.Duncan DullWeston,RN    Results for orders placed or performed in visit on 05/06/14 (from the past 24 hour(s))  POCT urinalysis dip (device)   Collection Time: 05/06/14  9:39 AM  Result Value Ref Range   Glucose, UA NEGATIVE NEGATIVE mg/dL   Bilirubin Urine NEGATIVE NEGATIVE   Ketones, ur NEGATIVE NEGATIVE mg/dL   Specific Gravity, Urine 1.015 1.005 - 1.030   Hgb urine dipstick NEGATIVE NEGATIVE   pH 7.0 5.0 - 8.0   Protein, ur NEGATIVE NEGATIVE mg/dL   Urobilinogen, UA 0.2 0.0 - 1.0 mg/dL   Nitrite NEGATIVE NEGATIVE   Leukocytes, UA SMALL (A) NEGATIVE  Fern test positive on 05/07/14  Patient Active Problem List   Diagnosis Date Noted  . Language barrier, speaks Spanish only 03/11/2014  . Traumatic injury during pregnancy, antepartum   . Postpartum depression, postpartum condition 07/17/2012  . Supervision of other normal pregnancy 06/12/2012  . Previous cesarean delivery, antepartum condition or complication 02/28/2012    Assessment: Rhonda Townsend is a 34 y.o. [id number] at 6960w1d here for SROM  #Labor:Expectant management #Pain: 8/10, Epidural requested #FWB: Cat 1 #ID:  GBS Neg #MOF: Breast #MOC: BTL #Circ:  Declined  Therese SarahKevin A Applegate, Medical Student 05/07/2014, 12:03 AM   CNM fellow attestation:  I have seen and examined this patient; I agree with above documentation in the medical student's note.   Rhonda Townsend  is a 34 y.o. [phone] here for SROM @ 2140 and ctx.  PE: BP 118/76 mmHg  Pulse 86  Temp(Src) 98.6 F (37 C) (Oral)  Resp 18  Ht 5\' 1"  (1.549 m)  Wt 81.194 kg (179 lb)  BMI 33.84 kg/m2  SpO2 100%  LMP 07/29/2013 (Exact Date) Gen: calm comfortable, NAD Resp: normal  effort, no distress Abd: gravid FHR: +accels, no decels Ctx: q 3-4 mins SSE: +pool, +fern Cx per RN: 4/70/vtx  ROS, labs, PMH reviewed  Assess/Plan: IUP@ term SROM/early labor Desires VBAC  Admit to [facility] Expectant management Anticipate SVD  Cam Hai CNM 05/07/2014, 1:44 AM

## 2014-05-06 NOTE — MAU Note (Signed)
Pt reports ROM at 2148

## 2014-05-07 ENCOUNTER — Encounter (HOSPITAL_COMMUNITY): Payer: Self-pay | Admitting: *Deleted

## 2014-05-07 ENCOUNTER — Inpatient Hospital Stay (HOSPITAL_COMMUNITY): Payer: Medicaid Other | Admitting: Anesthesiology

## 2014-05-07 DIAGNOSIS — O4290 Premature rupture of membranes, unspecified as to length of time between rupture and onset of labor, unspecified weeks of gestation: Secondary | ICD-10-CM | POA: Diagnosis present

## 2014-05-07 DIAGNOSIS — O99344 Other mental disorders complicating childbirth: Secondary | ICD-10-CM | POA: Diagnosis present

## 2014-05-07 DIAGNOSIS — O3421 Maternal care for scar from previous cesarean delivery: Secondary | ICD-10-CM | POA: Diagnosis present

## 2014-05-07 DIAGNOSIS — F329 Major depressive disorder, single episode, unspecified: Secondary | ICD-10-CM | POA: Diagnosis present

## 2014-05-07 DIAGNOSIS — F419 Anxiety disorder, unspecified: Secondary | ICD-10-CM | POA: Diagnosis present

## 2014-05-07 DIAGNOSIS — Z79899 Other long term (current) drug therapy: Secondary | ICD-10-CM | POA: Diagnosis not present

## 2014-05-07 DIAGNOSIS — Z3A40 40 weeks gestation of pregnancy: Secondary | ICD-10-CM | POA: Diagnosis not present

## 2014-05-07 LAB — CBC
HCT: 40 % (ref 36.0–46.0)
HEMATOCRIT: 31.6 % — AB (ref 36.0–46.0)
Hemoglobin: 10.7 g/dL — ABNORMAL LOW (ref 12.0–15.0)
Hemoglobin: 13.6 g/dL (ref 12.0–15.0)
MCH: 29.4 pg (ref 26.0–34.0)
MCH: 29.8 pg (ref 26.0–34.0)
MCHC: 33.9 g/dL (ref 30.0–36.0)
MCHC: 34 g/dL (ref 30.0–36.0)
MCV: 86.8 fL (ref 78.0–100.0)
MCV: 87.7 fL (ref 78.0–100.0)
PLATELETS: 124 10*3/uL — AB (ref 150–400)
PLATELETS: 128 10*3/uL — AB (ref 150–400)
RBC: 3.64 MIL/uL — AB (ref 3.87–5.11)
RBC: 4.56 MIL/uL (ref 3.87–5.11)
RDW: 15.6 % — ABNORMAL HIGH (ref 11.5–15.5)
RDW: 15.8 % — ABNORMAL HIGH (ref 11.5–15.5)
WBC: 17.4 10*3/uL — AB (ref 4.0–10.5)
WBC: 8 10*3/uL (ref 4.0–10.5)

## 2014-05-07 LAB — HIV ANTIBODY (ROUTINE TESTING W REFLEX): HIV Screen 4th Generation wRfx: NONREACTIVE

## 2014-05-07 LAB — TYPE AND SCREEN
ABO/RH(D): O POS
Antibody Screen: NEGATIVE

## 2014-05-07 LAB — RPR: RPR Ser Ql: NONREACTIVE

## 2014-05-07 MED ORDER — OXYCODONE-ACETAMINOPHEN 5-325 MG PO TABS: 2.0000 | ORAL_TABLET | ORAL | Status: DC | PRN

## 2014-05-07 MED ORDER — MISOPROSTOL 200 MCG PO TABS
800.0000 ug | ORAL_TABLET | Freq: Once | ORAL | Status: AC
  Administered 2014-05-07: 800 ug via RECTAL

## 2014-05-07 MED ORDER — METHYLERGONOVINE MALEATE 0.2 MG/ML IJ SOLN
0.2000 mg | INTRAMUSCULAR | Status: DC | PRN
  Administered 2014-05-07: 0.2 mg via INTRAMUSCULAR

## 2014-05-07 MED ORDER — ZOLPIDEM TARTRATE 5 MG PO TABS: 5.0000 mg | ORAL_TABLET | Freq: Every evening | ORAL | Status: DC | PRN

## 2014-05-07 MED ORDER — SENNOSIDES-DOCUSATE SODIUM 8.6-50 MG PO TABS
2.0000 | ORAL_TABLET | ORAL | Status: DC
  Administered 2014-05-07: 2 via ORAL
  Filled 2014-05-07: qty 2

## 2014-05-07 MED ORDER — EPHEDRINE 5 MG/ML INJ
10.0000 mg | INTRAVENOUS | Status: DC | PRN
  Filled 2014-05-07: qty 2

## 2014-05-07 MED ORDER — LACTATED RINGERS IV SOLN
500.0000 mL | Freq: Once | INTRAVENOUS | Status: AC
  Administered 2014-05-07: 500 mL via INTRAVENOUS

## 2014-05-07 MED ORDER — ONDANSETRON HCL 4 MG/2ML IJ SOLN
4.0000 mg | INTRAMUSCULAR | Status: DC | PRN
  Administered 2014-05-07: 4 mg via INTRAVENOUS
  Filled 2014-05-07: qty 2

## 2014-05-07 MED ORDER — LIDOCAINE HCL (PF) 1 % IJ SOLN
30.0000 mL | INTRAMUSCULAR | Status: DC | PRN
Start: 2014-05-07 — End: 2014-05-07
  Filled 2014-05-07: qty 30

## 2014-05-07 MED ORDER — DIPHENHYDRAMINE HCL 25 MG PO CAPS: 25.0000 mg | ORAL_CAPSULE | Freq: Four times a day (QID) | ORAL | Status: DC | PRN

## 2014-05-07 MED ORDER — DIBUCAINE 1 % RE OINT: 1.0000 "application " | TOPICAL_OINTMENT | RECTAL | Status: DC | PRN

## 2014-05-07 MED ORDER — METHYLERGONOVINE MALEATE 0.2 MG PO TABS: 0.2000 mg | ORAL_TABLET | ORAL | Status: DC | PRN

## 2014-05-07 MED ORDER — ACETAMINOPHEN 325 MG PO TABS: 650.0000 mg | ORAL_TABLET | ORAL | Status: DC | PRN

## 2014-05-07 MED ORDER — FENTANYL CITRATE 0.05 MG/ML IJ SOLN: 100.0000 ug | INTRAMUSCULAR | Status: DC | PRN

## 2014-05-07 MED ORDER — CITRIC ACID-SODIUM CITRATE 334-500 MG/5ML PO SOLN: 30.0000 mL | ORAL | Status: DC | PRN

## 2014-05-07 MED ORDER — PRENATAL MULTIVITAMIN CH
1.0000 | ORAL_TABLET | Freq: Every day | ORAL | Status: DC
  Administered 2014-05-08: 1 via ORAL
  Filled 2014-05-07: qty 1

## 2014-05-07 MED ORDER — ONDANSETRON HCL 4 MG/2ML IJ SOLN: 4.0000 mg | Freq: Four times a day (QID) | INTRAMUSCULAR | Status: DC | PRN

## 2014-05-07 MED ORDER — PHENYLEPHRINE 40 MCG/ML (10ML) SYRINGE FOR IV PUSH (FOR BLOOD PRESSURE SUPPORT)
80.0000 ug | PREFILLED_SYRINGE | INTRAVENOUS | Status: DC | PRN
  Filled 2014-05-07: qty 20
  Filled 2014-05-07: qty 2

## 2014-05-07 MED ORDER — IBUPROFEN 600 MG PO TABS
600.0000 mg | ORAL_TABLET | Freq: Four times a day (QID) | ORAL | Status: DC
  Administered 2014-05-07 – 2014-05-08 (×5): 600 mg via ORAL
  Filled 2014-05-07 (×5): qty 1

## 2014-05-07 MED ORDER — LANOLIN HYDROUS EX OINT: TOPICAL_OINTMENT | CUTANEOUS | Status: DC | PRN

## 2014-05-07 MED ORDER — LIDOCAINE HCL (PF) 1 % IJ SOLN
INTRAMUSCULAR | Status: DC | PRN
  Administered 2014-05-07 (×2): 5 mL
  Administered 2014-05-07: 3 mL

## 2014-05-07 MED ORDER — ONDANSETRON HCL 4 MG PO TABS: 4.0000 mg | ORAL_TABLET | ORAL | Status: DC | PRN

## 2014-05-07 MED ORDER — FENTANYL 2.5 MCG/ML BUPIVACAINE 1/10 % EPIDURAL INFUSION (WH - ANES)
14.0000 mL/h | INTRAMUSCULAR | Status: DC | PRN
  Administered 2014-05-07 (×2): 14 mL/h via EPIDURAL
  Filled 2014-05-07: qty 125

## 2014-05-07 MED ORDER — BENZOCAINE-MENTHOL 20-0.5 % EX AERO
1.0000 "application " | INHALATION_SPRAY | CUTANEOUS | Status: DC | PRN
  Administered 2014-05-07: 1 via TOPICAL
  Filled 2014-05-07: qty 56

## 2014-05-07 MED ORDER — LACTATED RINGERS IV SOLN: 500.0000 mL | INTRAVENOUS | Status: DC | PRN

## 2014-05-07 MED ORDER — ACETAMINOPHEN 325 MG PO TABS
650.0000 mg | ORAL_TABLET | ORAL | Status: DC | PRN
  Administered 2014-05-07 – 2014-05-08 (×3): 650 mg via ORAL
  Filled 2014-05-07 (×3): qty 2

## 2014-05-07 MED ORDER — MISOPROSTOL 200 MCG PO TABS
ORAL_TABLET | ORAL | Status: AC
  Filled 2014-05-07: qty 4

## 2014-05-07 MED ORDER — SIMETHICONE 80 MG PO CHEW: 80.0000 mg | CHEWABLE_TABLET | ORAL | Status: DC | PRN

## 2014-05-07 MED ORDER — PHENYLEPHRINE 40 MCG/ML (10ML) SYRINGE FOR IV PUSH (FOR BLOOD PRESSURE SUPPORT)
80.0000 ug | PREFILLED_SYRINGE | INTRAVENOUS | Status: DC | PRN
  Administered 2014-05-07: 80 ug via INTRAVENOUS
  Filled 2014-05-07: qty 2

## 2014-05-07 MED ORDER — OXYTOCIN 40 UNITS IN LACTATED RINGERS INFUSION - SIMPLE MED
62.5000 mL/h | INTRAVENOUS | Status: DC
  Filled 2014-05-07: qty 1000

## 2014-05-07 MED ORDER — TETANUS-DIPHTH-ACELL PERTUSSIS 5-2.5-18.5 LF-MCG/0.5 IM SUSP: 0.5000 mL | Freq: Once | INTRAMUSCULAR | Status: DC

## 2014-05-07 MED ORDER — METHYLERGONOVINE MALEATE 0.2 MG/ML IJ SOLN: 0.2000 mg | INTRAMUSCULAR | Status: DC | PRN

## 2014-05-07 MED ORDER — OXYCODONE-ACETAMINOPHEN 5-325 MG PO TABS
1.0000 | ORAL_TABLET | ORAL | Status: DC | PRN
Start: 2014-05-07 — End: 2014-05-08
  Administered 2014-05-07 – 2014-05-08 (×2): 1 via ORAL
  Filled 2014-05-07 (×2): qty 1

## 2014-05-07 MED ORDER — OXYCODONE-ACETAMINOPHEN 5-325 MG PO TABS: 1.0000 | ORAL_TABLET | ORAL | Status: DC | PRN

## 2014-05-07 MED ORDER — DIPHENHYDRAMINE HCL 50 MG/ML IJ SOLN: 12.5000 mg | INTRAMUSCULAR | Status: DC | PRN

## 2014-05-07 MED ORDER — OXYTOCIN BOLUS FROM INFUSION
500.0000 mL | INTRAVENOUS | Status: DC
  Administered 2014-05-07: 500 mL via INTRAVENOUS

## 2014-05-07 MED ORDER — WITCH HAZEL-GLYCERIN EX PADS: 1.0000 "application " | MEDICATED_PAD | CUTANEOUS | Status: DC | PRN

## 2014-05-07 MED ORDER — METHYLERGONOVINE MALEATE 0.2 MG/ML IJ SOLN
INTRAMUSCULAR | Status: AC
  Filled 2014-05-07: qty 1

## 2014-05-07 NOTE — Anesthesia Postprocedure Evaluation (Signed)
  Anesthesia Post-op Note  Patient: [name]  Procedure(s) Performed: * No procedures listed *  Patient Location: PACU and Mother/Baby  Anesthesia Type:Epidural  Level of Consciousness: awake, alert , oriented and patient cooperative  Airway and Oxygen Therapy: Patient Spontanous Breathing  Post-op Pain: none  Post-op Assessment: Post-op Vital signs reviewed, Patient's Cardiovascular Status Stable, Respiratory Function Stable, Patent Airway, No signs of Nausea or vomiting, Adequate PO intake, Pain level controlled, No headache, No backache, No residual numbness and No residual motor weakness  Post-op Vital Signs: Reviewed and stable  Last Vitals:  Filed Vitals:   05/07/14 0632  BP: 107/59  Pulse: 103  Temp: 37.4 C  Resp: 18    Complications: No apparent anesthesia complications

## 2014-05-07 NOTE — Progress Notes (Signed)
UR chart review completed.  

## 2014-05-07 NOTE — Progress Notes (Signed)
MOB was referred for history of depression/anxiety. * Referral screened out by Clinical Social Worker because none of the following criteria appear to apply: ~ History of anxiety/depression during this pregnancy, or of post-partum depression. ~ Diagnosis of anxiety and/or depression within last 3 years OR * MOB's symptoms currently being treated with medication and/or therapy. Please contact the Clinical Social Worker if needs arise, or if MOB requests.   

## 2014-05-07 NOTE — Progress Notes (Signed)
I assisted ArtistMargaret RN from Lactation with some questions, I also ordered patients dinner, snack, and breakfast, by Orlan LeavensViria Alvarez

## 2014-05-07 NOTE — Anesthesia Procedure Notes (Signed)
Epidural Patient location during procedure: OB  Staffing Anesthesiologist: Risha Barretta Performed by: anesthesiologist   Preanesthetic Checklist Completed: patient identified, site marked, surgical consent, pre-op evaluation, timeout performed, IV checked, risks and benefits discussed and monitors and equipment checked  Epidural Patient position: sitting Prep: DuraPrep Patient monitoring: heart rate, continuous pulse ox and blood pressure Approach: right paramedian Location: L3-L4 Injection technique: LOR saline  Needle:  Needle type: Tuohy  Needle gauge: 17 G Needle length: 9 cm and 9 Needle insertion depth: 6 cm Catheter type: closed end flexible Catheter size: 20 Guage Catheter at skin depth: 10 cm Test dose: negative  Assessment Events: blood not aspirated, injection not painful, no injection resistance, negative IV test and no paresthesia  Additional Notes   Patient tolerated the insertion well without complications.   

## 2014-05-07 NOTE — Lactation Note (Signed)
This note was copied from the chart of Rhonda Townsend. Lactation Consultation Note  Patient Name: Rhonda Townsend ZOXWR'UToday's Date: 05/07/2014 Reason for consult: Initial assessment;Other (Comment) (limited [place] - Spanish , [place] , [facility] interprerter present. Encouraged mom to page with feeding cues )  Baby is 14 hours old and has been to the breast x5 8-30 mins . Has voided , no stools as of yet. LC discussed with mom calling on her nurses light when baby shows  Feeding cues , so feeding assessment can be done. Discussed with mom why latch check needs to be done.  Per mom active with [facility] [facility].  Mother informed of post-discharge support and given phone number to the lactation department, including services for phone call assistance; out-patient appointments; and breastfeeding support group. List of other breastfeeding resources in the community given in the handout. Encouraged mother to call for problems or concerns related to breastfeeding.   Maternal Data Does the patient have breastfeeding experience prior to this delivery?: Yes  Feeding Feeding Type:  (per mom recently breast fed at 4pm for 8 mins. ) Length of feed: 10 min  LATCH Score/Interventions                      Lactation Tools Discussed/Used WIC Program: Yes (per mom [place] )   Consult Status Consult Status: Follow-up Date: 05/07/14 Follow-up type: In-patient    Kathrin Greathouseorio, Claudia Greenley Ann 05/07/2014, 4:45 PM

## 2014-05-07 NOTE — Anesthesia Preprocedure Evaluation (Signed)

## 2014-05-08 ENCOUNTER — Other Ambulatory Visit: Payer: Self-pay

## 2014-05-08 DIAGNOSIS — M79629 Pain in unspecified upper arm: Secondary | ICD-10-CM

## 2014-05-08 MED ORDER — IBUPROFEN 600 MG PO TABS: 600.0000 mg | ORAL_TABLET | Freq: Four times a day (QID) | ORAL | Status: DC | PRN

## 2014-05-08 NOTE — Progress Notes (Signed)
Interpreter in to do discharge teaching. All questions answered. Patient understood all teaching

## 2014-05-08 NOTE — Discharge Instructions (Signed)
Cuidados en el postparto luego de un parto vaginal  °(Postpartum Care After Vaginal Delivery) °Después del parto (período de postparto), la estadía normal en el hospital es de 24-72 horas. Si hubo problemas con el trabajo de parto o el parto, o si tiene otros problemas médicos, es posible que deba permanecer en el hospital por más tiempo.  °Mientras esté en el hospital, recibirá ayuda e instrucciones sobre cómo cuidar de usted misma y de su bebé recién nacido durante el postparto.  °Mientras esté en el hospital:  °· Asegúrese de decirle a las enfermeras si siente dolor o malestar, así como donde siente el dolor y qué empeora el dolor. °· Si usted tuvo una incisión cerca de la vagina (episiotomía) o si ha tenido algún desgarro durante el parto, las enfermeras le pondrán hielo sobre la episiotomía o el desgarro. Las bolsas de hielo pueden ayudar a reducir el dolor y la hinchazón. °· Si está amamantando, puede sentir contracciones dolorosas en el útero durante algunas semanas. Esto es normal. Las contracciones ayudan a que el útero vuelva a su tamaño normal. °· Es normal tener algo de sangrado después del parto. °¨ Durante los primeros 1-3 días después del parto, el flujo es de color rojo y la cantidad puede ser similar a un período. °¨ Es frecuente que el flujo se inicie y se detenga. °¨ En los primeros días, puede eliminar algunos coágulos pequeños. Informe a las enfermeras si elimina coágulos grandes o aumenta el flujo. °¨ No  elimine los coágulos de sangre por el inodoro antes de que la enfermera los vea. °¨ Durante los próximos 3 a 10 días después del parto, el flujo debe ser más acuoso y rosado o marrón. °¨ De diez a catorce días después del parto, el flujo debe ser una pequeña cantidad de secreción de color blanco amarillento. °¨ La cantidad de flujo disminuirá en las primeras semanas después del parto. El flujo puede detenerse en 6-8 semanas. La mayoría de las mujeres no tienen más flujo a las 12 semanas  después del parto. °· Usted debe cambiar sus apósitos con frecuencia. °· Lávese bien las manos con agua y jabón durante al menos 20 segundos después de cambiar el apósito, usar el baño o antes de sostener o alimentar a su recién nacido. °· Usted podrá sentir como que tiene que vaciar la vejiga durante las primeras 6-8 horas después del parto. °· En caso de que sienta debilidad, mareo o desmayo, llame a la enfermera antes de levantarse de la cama por primera vez y antes de tomar una ducha por primera vez. °· Dentro de los primeros días después del parto, sus mamas pueden comenzar a estar sensibles y llenas. Esto se llama congestión. La sensibilidad en los senos por lo general desaparece dentro de las 48-72 horas después de que ocurre la congestión. También puede notar que la leche se escapa de sus senos. Si no está amamantando no estimule sus pechos. La estimulación de las mamas hace que sus senos produzcan más leche. °· Pasar tanto tiempo como le sea posible con el bebé recién nacido es muy importante. Durante ese tiempo, usted y su bebé deben sentirse cerca y conocerse uno al otro. Tener al bebé en su habitación (alojamiento conjunto) ayudará a fortalecer el vínculo con el bebé recién nacido. Esto le dará tiempo para conocerlo y atenderlo de manera cómoda. °· Las hormonas se modifican después del parto. A veces, los cambios hormonales pueden causar tristeza o ganas de llorar por un tiempo. Estos sentimientos   no deben durar ms de Hughes Supplyunos pocos das. Si duran ms que eso, debe hablar con su mdico.  Si lo desea, hable con su mdico acerca de los mtodos de planificacin familiar o mtodos anticonceptivos.  Hable con su mdico acerca de las vacunas. El mdico puede indicarle que se aplique las siguientes vacunas antes de salir del hospital:  [place] contra el ttanos, la difteria y la tos ferina (Tdap) o el ttanos y la difteria (Td). Es muy importante que usted y su familia (incluyendo a los abuelos) u otras  personas que cuidan al recin nacido estn al da con las vacunas Tdap o Td. Las vacunas Tdap o Td pueden ayudar a proteger al recin nacido de enfermedades.  Inmunizacin contra la rubola.  Inmunizacin contra la varicela.  Inmunizacin contra la gripe. Usted debe recibir esta vacunacin anual si no la ha recibido Academic librariandurante el embarazo. Document Released: 11/06/2006 Document Revised: 10/04/2011 [facility] Patient Information 2015 [place], [place]. This information is not intended to replace advice given to you by your health care provider. Make sure you discuss any questions you have with your health care provider. Sndrome del tnel carpiano  (Carpal Tunnel Syndrome)  El tnel carpiano es un espacio estrecho ubicado en el lado palmar de la [place]. Est formado por los huesos de la [place] y los ligamentos. Los nervios, vasos sanguneos y tendones pasan a travs del tnel carpiano. Los movimientos de la [place] o ciertas enfermedades pueden causar hinchazn del tnel. Esta hinchazn comprime el nervio principal en la mueca ((nervio mediano) y ocasiona un trastorno doloroso que se denomina sndrome del tnel carpiano. CAUSAS   Movimientos repetidos de CHS Incla mueca.  Lesiones en la [place].  Ciertas enfermedades como la artritis, la diabetes, el alcoholismo, el hipertiroidismo o la insuficiencia renal.  Obesidad.  Embarazo. SNTOMAS   Sensacin de "pinchazos" en los dedos o la mano.  Hormigueo o entumecimiento en los dedos o en la mano.  Sensacin dolorosa en todo el brazo.  Dolor en la mueca que sube por el brazo hasta el hombro.  Dolor que baja por la mano o los dedos.  Sensacin de [place] Oildebilidad en las manos. DIAGNSTICO  El mdico le har una historia clnica y un examen fsico. Puede ser necesario hacer un electromiograma. Esta prueba mide las seales elctricas enviadas por los msculos. Generalmente el paso de las seales elctricas es impedido por el sndrome del tnel carpiano.  Tambin puede ser necesario que le tomen radiografas.  TRATAMIENTO  El sndrome del tnel carpiano puede curarse espontneamente sin tratamiento. El Office Depotmdico le indicar el uso de un cabestrillo para la [place] o que tome medicamentos como antiinflamatorios no esteroides. Las inyecciones de cortisona pueden ayudar. A veces es necesaria la ciruga para liberar el nervio comprimido.  INSTRUCCIONES PARA EL CUIDADO EN EL HOGAR   Tome todos los medicamentos segn le indic su mdico. Slo tome medicamentos de venta libre o recetados para Primary school teachercalmar el dolor, las molestias o bajar la fiebre segn las indicaciones de su mdico.  Si le aconsejaron usar un cabestrillo para evitar que la [place] se doble, selo como le indicaron. Es importante que use el cabestrillo durante la noche. selo mientras sienta dolor o adormecimiento en la mano, el brazo o la [place]. Esto puede durar entre 1 y 2 meses.  Haga reposar la [place] de toda actividad que le cause dolor. Si sus sntomas estn relacionados con Kathie Dikeel trabajo, deber conversar con su empleador acerca de la posibilidad de cambiar a una tarea que no  requiera el uso de la [place].  Aplique hielo en la mueca despus de los perodos prolongados de [place].  Ponga el hielo en una bolsa plstica.  Colquese una toalla entre la piel y la bolsa de hielo.  Deje el hielo en el lugar durante 15 a 20 minutos, 3 a 4 veces por da.  Cumpla con todas las visitas de control, segn le indique su mdico. Aqu se incluyen derivaciones a un ortopedista, fisioterapia y rehabilitacin. Gildardo Griffes en obtener la asistencia necesaria puede dar como resultado una demora o fracaso en la curacin. SOLICITE ATENCIN MDICA DE INMEDIATO SI:   Desarrolla nuevos e inexplicables sntomas.  Los sntomas actuales empeoran y la medicacin no los [place]. ASEGRESE DE QUE:   Comprende estas instrucciones.  Controlar su enfermedad.  Solicitar ayuda de inmediato si no mejora o si  empeora. Document Released: 01/09/2005 Document Revised: 10/04/2011 [facility] Patient Information 2015 [place], [place]. This information is not intended to replace advice given to you by your health care provider. Make sure you discuss any questions you have with your health care provider.

## 2014-05-08 NOTE — Progress Notes (Signed)
Interpreter in earlier this am to go over plan of care and answer patient questions. Patient stated she is still dizzy but fundus is even and firm and lochia is small. MD aware.

## 2014-05-08 NOTE — Progress Notes (Signed)
VASCULAR LAB PRELIMINARY  PRELIMINARY  PRELIMINARY  PRELIMINARY  Right upper extremity venous duplex completed.    Preliminary report:  Right :  No evidence of DVT or superficial thrombosis.    Rhonda Townsend, RVS 05/08/2014, 11:56 AM

## 2014-05-08 NOTE — Discharge Summary (Signed)
Obstetric Discharge Summary Reason for Admission: onset of labor and rupture of membranes Prenatal Procedures: none Intrapartum Procedures: spontaneous vaginal delivery Postpartum Procedures: none Complications-Operative and Postpartum: none HEMOGLOBIN  Date Value Ref Range Status  05/07/2014 10.7* 12.0 - 15.0 g/dL Final    Comment:    DELTA CHECK NOTED REPEATED TO VERIFY    HCT  Date Value Ref Range Status  05/07/2014 31.6* 36.0 - 46.0 % Final   Rhonda Townsend is a 33yo [id number] admitted at 40.2wks with active labor/SROM. She progressed to SVD within a few hours. Immed after placenta delivered she had an approximate 500cc gush of blood which slowed with massage. Cytotec and methergine series given with good result. By PPD#1 she reported feeling sl dizzy w/ amb, but her pulse remained nl. Hgb the morning after delivery- 10.7. She also c/o her right arm feeling 'numb' from the elbow to the hand and it was sl more edematous with a weaker grip than her left. Doppler studies- nl. It was determined that she was stable for d/c. She was breastfeeding and desired BTL, but will pursue a form of LARC PP. She will have a 2 week f/u due to her arm weakness and also for a mental health check as she has a strong hx of depression/PPD. She stopped Zoloft a couple of weeks ago and declines restarting it at this time.  Physical Exam:  General: alert, cooperative and no distress  Heart: RRR Lungs: nl effort Right arm: sl more edematous than L, grip not as strong Lochia: appropriate Uterine Fundus: firm DVT Evaluation: No evidence of DVT seen on physical exam.  Discharge Diagnoses: Term Pregnancy-delivered  Discharge Information: Date: 05/08/2014 Activity: pelvic rest Diet: routine Medications: PNV and Ibuprofen Condition: stable Instructions: refer to practice specific booklet Discharge to: home Follow-up Information    [facility] Schedule an appointment as  soon as possible for a visit in 2 weeks.   Why:  We will see you in 2 weeks to follow up on your right arm, and to check to see if you are having signs of depression.   Contact information:   [address] [place] [id number] [phone]      Newborn Data: Live born female  Birth Weight: 7 lb 12.4 oz (3527 g) APGAR: 8, 9  Home with mother.  Cam HaiSHAW, Sakai Heinle CNM 05/08/2014, 12:29 PM

## 2014-05-28 ENCOUNTER — Ambulatory Visit (INDEPENDENT_AMBULATORY_CARE_PROVIDER_SITE_OTHER): Payer: Self-pay | Admitting: Obstetrics and Gynecology

## 2014-05-28 ENCOUNTER — Encounter: Payer: Self-pay | Admitting: Obstetrics and Gynecology

## 2014-05-28 VITALS — BP 104/63 | HR 77 | Temp 98.1°F | Wt 154.4 lb

## 2014-05-28 DIAGNOSIS — R202 Paresthesia of skin: Secondary | ICD-10-CM

## 2014-05-28 DIAGNOSIS — R200 Anesthesia of skin: Secondary | ICD-10-CM

## 2014-05-28 MED ORDER — SERTRALINE HCL 50 MG PO TABS: 50.0000 mg | ORAL_TABLET | Freq: Every day | ORAL | Status: DC

## 2014-05-28 NOTE — Addendum Note (Signed)
Addended by: Faythe CasaBELLAMY, Zayvon Alicea M on: 05/28/2014 04:29 PM   Modules accepted: Orders

## 2014-05-28 NOTE — Progress Notes (Signed)
Patient ID: Rhonda Townsend, female   DOB: 1980/10/22, 34 y.o.   MRN: [id number] 34 yo here for early postpartum check to screen for postpartum depression and follow up on her right arm numbness. Patient reports that she is alone carrying for her children all the time. She feels overwhelmed at times and often cries a lot. She denies any suicidal or homicidal ideations and is willing to restart zoloft to see if it will help. Patient reports persistent right arm numbness since delivery. The numbness involves her right forearm and last 3 digits of her right hand. It makes it hard for her to care for her baby sometimes.   GENERAL: Well-developed, well-nourished female in no acute distress.  EXTREMITIES: No cyanosis, clubbing, or edema, 2+ distal pulses. Equal strength and tone in both upper extremity. Equal hand grip.  A/P 34 yo s/p vaginal delivery on 4/15 with clinical signs of depression and unresolved right hand numbness - Rx Zoloft provided - Referal to ortho provided - RTC in 3 weeks for postpartum visit

## 2014-05-28 NOTE — Progress Notes (Addendum)
[facility] @ [phone].  Appt scheduled for Dr. Myra RudeGioffrey on May 14th @ 0930.    Pt is aware that she will need to bring $250 to the appt due to self pay.  Pt has started the process for [employer]illard'sMedicaid insurance.   Front office to fax office notes to [phone].   [place], front desk, called pt with appt information.

## 2014-06-15 ENCOUNTER — Encounter: Payer: Self-pay | Admitting: Obstetrics & Gynecology

## 2014-06-15 ENCOUNTER — Ambulatory Visit (INDEPENDENT_AMBULATORY_CARE_PROVIDER_SITE_OTHER): Payer: Self-pay | Admitting: Obstetrics & Gynecology

## 2014-06-15 VITALS — BP 119/72 | HR 74 | Temp 98.3°F | Wt 158.5 lb

## 2014-06-15 DIAGNOSIS — O99345 Other mental disorders complicating the puerperium: Principal | ICD-10-CM

## 2014-06-15 DIAGNOSIS — F53 Postpartum depression: Secondary | ICD-10-CM

## 2014-06-15 DIAGNOSIS — Z3009 Encounter for other general counseling and advice on contraception: Secondary | ICD-10-CM

## 2014-06-15 MED ORDER — SERTRALINE HCL 50 MG PO TABS: 50.0000 mg | ORAL_TABLET | Freq: Every day | ORAL | Status: DC

## 2014-06-15 MED ORDER — NORETHINDRONE 0.35 MG PO TABS: 1.0000 | ORAL_TABLET | Freq: Every day | ORAL | Status: DC

## 2014-06-15 NOTE — Patient Instructions (Signed)
Depresin posparto y baby blues (Postpartum Depression and Baby Blues) El perodo del posparto comienza inmediatamente despus del nacimiento de un beb y suele ser una poca de gran felicidad y mucho entusiasmo. Tambin es tiempo de muchos cambios en la vida de los padres. Sin importar cuntos hijos tenga una madre, cada nio plantea nuevos desafos y una nueva dinmica a la familia. Es frecuente que haya sentimientos de entusiasmo junto con cambios confusos en el estado de nimo, las emociones y los pensamientos. Todas las madres corren riesgo de tener depresin posparto o "baby blues". Estos cambios en el estado de nimo pueden presentarse inmediatamente despus del parto o muchos meses despus del nacimiento de un nio. El baby blues o la depresin posparto pueden ser leves o graves. Adems, la depresin posparto puede desaparecer con bastante rapidez o puede ser una enfermedad de larga evolucin.  CAUSAS Se cree que el aumento de las concentraciones hormonales y su rpida disminucin es la causa principal de la depresin posparto y el baby blues. Durante y despus del embarazo, un nmero de hormonas cambian. El estrgeno y la progesterona generalmente disminuyen inmediatamente despus del parto. Tambin disminuyen con rapidez las concentraciones de la hormona tiroidea y de varios esteroides del cortisol. Otros factores que juegan un papel en estos cambios anmicos incluyen los sucesos importantes de la vida y la gentica.  FACTORES DE RIESGO Si tiene alguno de los siguientes riesgos de desarrollar baby blues o depresin posparto, sepa a qu sntomas debe estar atenta durante el perodo del posparto. Los factores de riesgo que pueden aumentar la probabilidad de tener baby blues o depresin posparto incluyen lo siguiente:  Antecedentes personales o familiares de depresin.  Depresin durante el embarazo.  Problemas anmicos premenstruales o que guardan relacin con los anticonceptivos orales.  Mucho  estrs en la vida.  Conflictos maritales.  Falta de una red de apoyo social.  Tener un beb con necesidades especiales.  Problemas de salud, como diabetes. SIGNOS Y SNTOMAS Los sntomas de baby blues incluyen lo siguiente:  Cambios en el estado de nimo durante lapsos breves, como pasar de la felicidad extrema a la tristeza.  Falta de concentracin.  Dificultad para dormir.  Ataques de llanto, sensibilidad emocional.  Irritabilidad.  Ansiedad. Generalmente, los sntomas de depresin posparto comienzan en el trmino del primer mes despus del parto. Estos sntomas incluyen:  Dificultad para dormir o somnolencia excesiva.  Prdida de peso notable.  Agitacin.  Sentimientos de inutilidad.  Falta de inters en las actividades o la comida. La psicosis posparto es una enfermedad muy grave que puede ser peligrosa. Afortunadamente, es poco frecuente. Si aparece alguno de los siguientes sntomas, se debe recibir atencin mdica de inmediato. Los sntomas de psicosis posparto incluyen lo siguiente:   Alucinaciones y delirios.  Comportamiento atpico o desorganizado.  Confusin o desorientacin. DIAGNSTICO  El diagnstico se realiza mediante la evaluacin de los sntomas. No hay exmenes mdicos ni pruebas de laboratorio que permitan hacer un diagnstico, pero hay varios cuestionarios que el mdico puede usar para identificar a las personas que tienen baby blues, depresin posparto o psicosis. A menudo se usa una herramienta de deteccin sistemtica llamada [place] de depresin posnatal de [place] para diagnosticar la depresin durante el perodo del posparto.  TRATAMIENTO Generalmente, el baby blues desaparece solo en el trmino de 1 o 2semanas. A menudo, todo lo que se necesita es apoyo social. Se le recomendar que duerma y descanse lo suficiente. En ocasiones, se le pueden administrar medicamentos para ayudarla a dormir.    La depresin posparto requiere tratamiento porque  puede durar varios meses o ms tiempo si no se la trata. El tratamiento puede incluir terapia individual o grupal, medicamentos o ambos para abordar los factores sociales, fisiolgicos y psicolgicos que pueden tener influencia en la depresin. Tambin pueden recomendarse enfticamente el ejercicio regular, una alimentacin sana, el descanso y el apoyo social.  La psicosis posparto es una enfermedad ms grave y requiere tratamiento inmediato. A menudo es necesaria la hospitalizacin. INSTRUCCIONES PARA EL CUIDADO EN EL HOGAR  Descanse todo lo posible. Tome una siesta cuando el beb duerme.  Haga ejercicios regularmente. Para algunas mujeres, el yoga y las caminatas son beneficiosas.  Consuma una dieta equilibrada y nutritiva.  Haga pequeas cosas que disfruta. Tome una taza de t, dese un bao de burbujas, lea su revista favorita o escuche su msica predilecta.  Evite el alcohol.  Pida ayuda con los quehaceres domsticos, la cocina, las compras de comida o las obligaciones diarias si lo necesita. No intente hacer todo.  Hable con personas allegadas sobre cmo se siente. Busque el apoyo de su pareja, sus familiares, sus amigos o de otras madres primerizas.  Intente pensar positivamente. Piense en aquellas cosas por las que se siente agradecida.  No pase mucho tiempo sola.  Tome solo medicamentos de venta libre o recetados, segn las indicaciones del mdico.  Cumpla con todos los controles del postparto.  Infrmele a su mdico sobre cualquier inquietud que tenga. SOLICITE ATENCIN MDICA SI: Tiene una reaccin al medicamento o problemas con este. SOLICITE ATENCIN MDICA DE INMEDIATO SI:  Tiene sentimientos suicidas.  Cree que podra lastimar al beb o a otra persona. ASEGRESE DE QUE:  Comprende estas instrucciones.  Controlar su afeccin.  Recibir ayuda de inmediato si no mejora o si empeora. Document Released: 06/28/2007 Document Revised: 01/14/2013 ExitCare Patient  Information 2015 ExitCare, LLC. This information is not intended to replace advice given to you by your health care provider. Make sure you discuss any questions you have with your health care provider.  

## 2014-06-15 NOTE — Progress Notes (Signed)
Patient ID: Rhonda Townsend, female   DOB: Sep 25, 1980, 34 y.o.   MRN: [id number] Subjective:     Rhonda Townsend is a 34 y.o. female who presents for a postpartum visit. She is 6 weeks postpartum following a spontaneous vaginal delivery. I have fully reviewed the prenatal and intrapartum course. The delivery was at 40 weeds 2 days gestational weeks. Outcome: vaginal birth after cesarean (VBAC). Anesthesia: epidural. Postpartum course has been complicated depression.  Did not fill antidepressant prescribed by Dr. Jolayne Pantheronstant.  Baby's course has been uncompicated. Baby is feeding by breast. Bleeding spotting. Bowel function is normal. Bladder function is normal. Patient is not sexually active. Contraception method is abstinence. Postpartum depression screening: positive.  [facility]  The following portions of the patient's history were reviewed and updated as appropriate: allergies, current medications, past family history, past medical history, past social history, past surgical history and problem list.  Review of Systems A comprehensive review of systems was negative except for: Gastrointestinal: positive for abdominal pain   Objective:  Pt in NAD BP 119/72 mmHg  Pulse 74  Temp(Src) 98.3 F (36.8 C)  Wt 158 lb 8 oz (71.895 kg)  Breastfeeding? Yes Abd; soft, ND, slightly tender to palpation in LLQ. GU: EGBUS: no lesions Vagina: no blood in vault Cervix: no lesion; no mucopurulent d/c Uterus: small, mobile Adnexa: no masses; non tender          Assessment:     6 weeks postpartum exam. Pap smear not done at today's visit.   + PP depression screen.  Pt was not aware that she had an antidepressant at the pharmacy Contraception counseling   Plan:    1. Contraception: Progestion only pills   2. Zoloft 50mg  q day 3. F/u with behavioral health- info given to pt to contact.

## 2015-02-08 ENCOUNTER — Encounter (HOSPITAL_COMMUNITY): Payer: Self-pay

## 2015-02-08 ENCOUNTER — Emergency Department (HOSPITAL_COMMUNITY)
Admission: EM | Admit: 2015-02-08 | Discharge: 2015-02-09 | Disposition: A | Discharge status: Home / Self Care | Payer: Self-pay | Attending: Emergency Medicine | Admitting: Emergency Medicine

## 2015-02-08 DIAGNOSIS — R1013 Epigastric pain: Secondary | ICD-10-CM

## 2015-02-08 DIAGNOSIS — Z3202 Encounter for pregnancy test, result negative: Secondary | ICD-10-CM | POA: Insufficient documentation

## 2015-02-08 DIAGNOSIS — Z79899 Other long term (current) drug therapy: Secondary | ICD-10-CM | POA: Insufficient documentation

## 2015-02-08 DIAGNOSIS — K5900 Constipation, unspecified: Secondary | ICD-10-CM | POA: Insufficient documentation

## 2015-02-08 LAB — COMPREHENSIVE METABOLIC PANEL
ALBUMIN: 4.1 g/dL (ref 3.5–5.0)
ALT: 29 U/L (ref 14–54)
AST: 27 U/L (ref 15–41)
Alkaline Phosphatase: 90 U/L (ref 38–126)
Anion gap: 7 (ref 5–15)
BUN: 11 mg/dL (ref 6–20)
CHLORIDE: 112 mmol/L — AB (ref 101–111)
CO2: 24 mmol/L (ref 22–32)
CREATININE: 0.85 mg/dL (ref 0.44–1.00)
Calcium: 9 mg/dL (ref 8.9–10.3)
GFR calc Af Amer: >60 mL/min (ref >60)
GFR calc non Af Amer: >60 mL/min (ref >60)
GLUCOSE: 91 mg/dL (ref 65–99)
Potassium: 3.7 mmol/L (ref 3.5–5.1)
SODIUM: 143 mmol/L (ref 135–145)
Total Bilirubin: 0.1 mg/dL — ABNORMAL LOW (ref 0.3–1.2)
Total Protein: 7.1 g/dL (ref 6.5–8.1)

## 2015-02-08 LAB — CBC
HEMATOCRIT: 41.7 % (ref 36.0–46.0)
Hemoglobin: 13.9 g/dL (ref 12.0–15.0)
MCH: 29.6 pg (ref 26.0–34.0)
MCHC: 33.3 g/dL (ref 30.0–36.0)
MCV: 88.7 fL (ref 78.0–100.0)
Platelets: 203 10*3/uL (ref 150–400)
RBC: 4.7 MIL/uL (ref 3.87–5.11)
RDW: 13 % (ref 11.5–15.5)
WBC: 6.3 10*3/uL (ref 4.0–10.5)

## 2015-02-08 LAB — LIPASE, BLOOD: LIPASE: 29 U/L (ref 11–51)

## 2015-02-08 NOTE — ED Notes (Signed)
Pt here for upper abd pain and left lower abd pain off and on for the past 3 weeks. Has had nausea with it but no vomiting or diarrhea. No vaginal bleeding or discharge.

## 2015-02-09 ENCOUNTER — Emergency Department (HOSPITAL_COMMUNITY): Payer: Self-pay

## 2015-02-09 LAB — URINALYSIS, ROUTINE W REFLEX MICROSCOPIC
Bilirubin Urine: NEGATIVE
Glucose, UA: NEGATIVE mg/dL
HGB URINE DIPSTICK: NEGATIVE
Ketones, ur: NEGATIVE mg/dL
Leukocytes, UA: NEGATIVE
Nitrite: NEGATIVE
PROTEIN: NEGATIVE mg/dL
Specific Gravity, Urine: 1.025 (ref 1.005–1.030)
pH: 6.5 (ref 5.0–8.0)

## 2015-02-09 LAB — POC URINE PREG, ED: PREG TEST UR: NEGATIVE

## 2015-02-09 MED ORDER — GI COCKTAIL ~~LOC~~: 30.0000 mL | Freq: Once | ORAL | Status: DC

## 2015-02-09 MED ORDER — POLYETHYLENE GLYCOL 3350 17 G PO PACK: 17.0000 g | PACK | Freq: Every day | ORAL | Status: DC

## 2015-02-09 MED ORDER — ALUM & MAG HYDROXIDE-SIMETH 200-200-20 MG/5ML PO SUSP
30.0000 mL | Freq: Once | ORAL | Status: AC
  Administered 2015-02-09: 30 mL via ORAL
  Filled 2015-02-09: qty 30

## 2015-02-09 MED ORDER — OMEPRAZOLE 20 MG PO CPDR: 20.0000 mg | DELAYED_RELEASE_CAPSULE | Freq: Every day | ORAL | Status: DC

## 2015-02-09 NOTE — Discharge Instructions (Signed)
Dolor abdominal en adultos (Abdominal Pain, Adult) El dolor puede tener muchas causas. Normalmente la causa del dolor abdominal no es una enfermedad y Scientist, clinical (histocompatibility and immunogenetics) sin TEFL teacher. Frecuentemente puede controlarse y tratarse en casa. Su mdico le Medical sales representative examen fsico y posiblemente solicite anlisis de sangre y radiografas para ayudar a Chief Strategy Officer la gravedad de su dolor. Sin embargo, en [employer], debe transcurrir ms tiempo antes de que se pueda Clinical research associate una causa evidente del dolor. Antes de llegar a ese punto, es posible que su mdico no sepa si necesita ms pruebas o un tratamiento ms profundo. INSTRUCCIONES PARA EL CUIDADO EN EL HOGAR  Est atento al dolor para ver si hay cambios. Las siguientes indicaciones ayudarn a Architectural technologist que pueda sentir:  [place] solo medicamentos de venta libre o recetados, segn las indicaciones del mdico.  No tome laxantes a menos que se lo haya indicado su mdico.  Pruebe con Neomia Dear dieta lquida absoluta (caldo, t o agua) segn se lo indique su mdico. Introduzca gradualmente una dieta normal, segn su tolerancia. SOLICITE ATENCIN MDICA SI:  Tiene dolor abdominal sin explicacin.  Tiene dolor abdominal relacionado con nuseas o diarrea.  Tiene dolor cuando orina o defeca.  Experimenta dolor abdominal que lo despierta de noche.  Tiene dolor abdominal que empeora o mejora cuando come alimentos.  Tiene dolor abdominal que empeora cuando come alimentos grasosos.  Tiene fiebre. SOLICITE ATENCIN MDICA DE INMEDIATO SI:   El dolor no desaparece en un plazo mximo de 2horas.  No deja de (vomitar).  El Engineer, mining se siente solo en partes del abdomen, como el lado derecho o la parte inferior izquierda del abdomen.  Evaca materia fecal sanguinolenta o negra, de aspecto alquitranado. ASEGRESE DE QUE:  Comprende estas instrucciones.  Controlar su afeccin.  Recibir ayuda de inmediato si no mejora o si empeora.   Esta  informacin no tiene Theme park manager el consejo del mdico. Asegrese de hacerle al mdico cualquier pregunta que tenga.   Document Released: 01/09/2005 Document Revised: 01/30/2014 Elsevier Interactive Patient Education 2016 [employer]. Estreimiento - Adultos (Constipation, Adult) Estreimiento significa que una persona tiene menos de tres evacuaciones en una semana, dificultad para defecar, o que las heces son secas, duras, o ms grandes que lo normal. A medida que envejecemos el estreimiento es ms comn. Una dieta baja en fibra, no tomar suficientes lquidos y el uso de ciertos medicamentos pueden Scientist, research (life sciences).  CAUSAS   Ciertos medicamentos, como los antidepresivos, analgsicos, suplementos de hierro, anticidos y diurticos.  Algunas enfermedades, como la diabetes, el sndrome del colon irritable, enfermedad de la tiroides, o depresin.  No beber suficiente agua.  No consumir suficientes alimentos ricos en fibra.  Situaciones de estrs o viajes.  Falta de actividad fsica o de ejercicio.  Ignorar la necesidad sbita de Advertising copywriter.  Uso en exceso de laxantes. SIGNOS Y SNTOMAS   Defecar menos de tres veces por semana.  Dificultad para defecar.  Tener las heces secas y duras, o ms grandes que las normales.  Sensacin de estar lleno o hinchado.  Dolor en la parte baja del abdomen.  No sentir alivio despus de defecar. DIAGNSTICO  El mdico le har una historia clnica y un examen fsico. Pueden hacerle exmenes adicionales para el estreimiento grave. Estos estudios pueden ser:  Un radiografa con enema de bario para examinar el recto, el colon y, en algunos casos, el intestino delgado.  Una sigmoidoscopia para examinar el colon inferior.  Una colonoscopia para examinar todo el colon.  TRATAMIENTO  El tratamiento depender de la gravedad del estreimiento y de la causa. Algunos tratamientos nutricionales son beber ms lquidos y comer ms alimentos  ricos en fibra. El cambio en el estilo de vida incluye hacer ejercicios de [place] regular. Si estas recomendaciones para Public relations account executive dieta y en el estilo de vida no ayudan, el mdico le puede indicar el uso de laxantes de venta libre para ayudarlo a Advertising copywriter. Los medicamentos recetados se pueden prescribir si los medicamentos de venta libre no lo [place].  INSTRUCCIONES PARA EL CUIDADO EN EL HOGAR   Consuma alimentos con alto contenido de [place], como frutas, vegetales, cereales integrales y porotos.  Limite los alimentos procesados ricos en grasas y azcar, como las papas fritas, hamburguesas, galletas, dulces y refrescos.  Puede agregar un suplemento de fibra a su dieta si no obtiene lo suficiente de los alimentos.  Beba suficiente lquido para Photographer orina clara o de color amarillo plido.  Haga ejercicio regularmente o segn las indicaciones del mdico.  Vaya al bao cuando sienta la necesidad de ir. No se aguante las ganas.  Tome solo medicamentos de venta libre o recetados, segn las indicaciones del mdico. No tome otros medicamentos para el estreimiento sin consultarlo antes con su mdico. SOLICITE ATENCIN MDICA DE INMEDIATO SI:   Observa sangre brillante en las heces.  El estreimiento dura ms de 4 das o [place].  Siente dolor abdominal o rectal.  Las heces son delgadas como un lpiz.  Pierde peso de [place] inexplicable. ASEGRESE DE QUE:   Comprende estas instrucciones.  Controlar su afeccin.  Recibir ayuda de inmediato si no mejora o si empeora.   Esta informacin no tiene Theme park manager el consejo del mdico. Asegrese de hacerle al mdico cualquier pregunta que tenga.   Document Released: 01/29/2007 Document Revised: 01/30/2014 Elsevier Interactive Patient Education [employer]  Emergency Department Resource Guide 1) Find a Doctor and Pay Out of Pocket Although you won't have to find out who is covered by your insurance plan, it  is a good idea to ask around and get recommendations. You will then need to call the office and see if the doctor you have chosen will accept you as a new patient and what types of options they offer for patients who are self-pay. Some doctors offer discounts or will set up payment plans for their patients who do not have insurance, but you will need to ask so you aren't surprised when you get to your appointment.  2) Contact Your Local Health Department Not all health departments have doctors that can see patients for sick visits, but many do, so it is worth a call to see if yours does. If you don't know where your local health department is, you can check in your phone book. The CDC also has a tool to help you locate your state's health department, and many state websites also have listings of all of their local health departments.  3) Find a Walk-in Clinic If your illness is not likely to be very severe or complicated, you may want to try a walk in clinic. These are popping up all over the country in pharmacies, drugstores, and shopping centers. They're usually staffed by nurse practitioners or physician assistants that have been trained to treat common illnesses and complaints. They're usually fairly quick and inexpensive. However, if you have serious medical issues or chronic medical problems, these are probably not your best option.  No Primary Care Doctor: - Call  Health Connect at  [phone] - they can help you locate a primary care doctor that  accepts your insurance, provides certain services, etc. - Physician Referral Service- [phone]  Chronic Pain Problems: Organization         Address  Phone   Notes  [facility]  [phone] Patients need to be referred by their primary care doctor.   Medication Assistance: Organization         Address  Phone   Notes  [facility] Medication [facility] [address] [place] [place], [place] [address] [phone] --Must be a resident of [facility] -- Must have NO insurance coverage whatsoever (no Medicaid/ Medicare, etc.) -- The pt. MUST have a primary care doctor that directs their care regularly and follows them in the community   [facility]ist  [phone]   [employer]  [phone]    Agencies that provide inexpensive medical care: Organization         Address  Phone   Notes  [facility]  [phone]   [facility]    [phone]   [facility] [address] [place], [place] [address] [phone]   [facility] [address] [place], [place] [phone]   Planned Parenthood    [phone]   [facility]    [phone]   [facility] and [facility]  [address], [place] Phone:  [phone], Fax:  [phone] Hours of Operation:  9 am - 6 pm, M-F.  Also accepts Medicaid/Medicare and self-pay.  [facility]  [address], [place] Phone: [phone], Fax: [phone]. Hours of Operation:  8:30 am - 5:30 pm, M-F.  Also accepts Medicaid and self-pay.  [facility] [place] [address] [place] Phone: [phone]   [facility] [address] Natasha Bence [place], [place] [phone], Ext. [phone] Mondays & Thursdays: 7-9 AM.  First 15 patients are seen on a first come, first serve basis.    Medicaid-accepting [facility] Providers:  Organization         Address  Phone   Notes  [facility] [address], [place] [phone] Also accepts self-pay patients.  [facility] [address] Laurell Josephs [place], [place]  [phone]   [facility] [address], [place] [phone]   [facility] [address], [place] [phone]   Renaye Rakers [address], [place]   [phone] Only accepts [place] Access [place] patients after they have their name applied to their card.   Self-Pay (no insurance) in [facility]:  Organization         Address  Phone   Notes  Sickle Cell Patients, [facility] [address] [place], [place] [phone]   [facility] [address] [place], [place] [phone]   [facility] [place]  [address], [place] [phone]   [facility]/Dr. Osei-Bonsu  [address], [place] or [address], [place] [phone] Phone number for both [place] and [place] locations is the same.  [facility] [address] Ginette Otto [phone]   [facility] [phone]  [address] [place] or [address] [phone]   [facility] [address] [place], [place] [phone], phone; [phone], fax Sees patients 1st and 3rd Saturday of every month.  Must not qualify for public or private insurance (i.e. Medicaid, Medicare, [place] Health Choice, Veterans' Benefits)  Household income should be no more than 200% of the poverty level The clinic cannot treat you if you are pregnant or think you are pregnant  Sexually transmitted diseases are not treated at the clinic.    Dental Care: Organization         Address  Phone  Notes  [facility] Department of [facility] [address] [place], [place] [phone] Accepts children up to age 6 who are enrolled in [place] or [place] Health Choice; pregnant women with a Medicaid card; and children who have applied for Medicaid or [place] Health Choice, but were declined, whose parents can pay a reduced fee at time of service.  [facility] Department of [facility]  [address], [place] [phone] Accepts children up to age 46 who are enrolled in [place] or [place] Health  Choice; pregnant women with a Medicaid card; and children who have applied for Medicaid or [place] Health Choice, but were declined, whose parents can pay a reduced fee at time of service.  [facility]  [address] [place], [place] [phone] Patients are seen by appointment only. Walk-ins are not accepted. [facility] will see patients 49 years of age and older. Monday - Tuesday (8am-5pm) Most Wednesdays (8:30-5pm) $30 per visit, cash only  [facility]  [address], [facility] [phone] Patients are seen by appointment only. Walk-ins are not accepted. [facility] will see patients 47 years of age and older. One Wednesday Evening (Monthly: Volunteer Based).  $30 per visit, cash only  [employer]  [phone] for adults; Children under age 5, call Graduate Pediatric Dentistry at [phone]. Children aged 48-14, please call [phone] to request a pediatric application.  Dental services are provided in all areas of dental care including fillings, crowns and bridges, complete and partial dentures, implants, gum treatment, root canals, and extractions. Preventive care is also provided. Treatment is provided to both adults and children. Patients are selected via a lottery and there is often a waiting list.   [facility] [address] [place]  [phone] [email or web address]   [facility] [address] [place], [place] [phone], Ext. [phone] Second and Fourth Thursday of each month, opens at 6:30 AM; Clinic ends at 9 AM.  Patients are seen on a first-come first-served basis, and a limited number are seen during each clinic.   [facility]  [address] Ether Griffins [place], [place] [phone]   Eligibility Requirements You must have lived in [place], [place], or [place] counties for at least the last three months.   You cannot be eligible for state or  federal sponsored [place], including [employer], [place], or [employer].   You generally cannot be eligible for healthcare insurance through your employer.    How to apply: Eligibility screenings are held every Tuesday and Wednesday afternoon from 1:00 pm until 4:00 pm. You do not need an appointment for the interview!  [facility] [address] [place], [place] [phone]   [facility] Department  [phone]   [facility]  [phone]   [facility]  [phone]    Behavioral Health Resources in the Community: Intensive Outpatient Programs Organization         Address  Phone  Notes  [facility] [address], [place], [place] [phone]   [facility]patient [address], [place], [place] [phone]   [facility] [address] [place], [place]  [phone]   [facility] [address],  [place], [place] [phone] or [phone]   Substance Abuse Resources Organization         Address  Phone  Notes  [facility]  [phone]   [facility]  [phone]   [facility]  [phone]   Floydene Flock  [phone]   Residential & Outpatient Substance Abuse Program  [phone]   Psychological Services Organization         Address  Phone  Notes  [facility] Health  [phone]   [facility] Services  [phone]   [facility] [address] [place] [phone] or [phone]    Mobile Crisis Teams Organization         Address  Phone  Notes  [facility], Mobile Crisis Care Unit  [phone]   [facility]  [address] [place], [place] [phone]   Doristine Locks [address] [place] [phone]    Self-Help/Support Groups Organization         Address  Phone              Notes  [facility] [place] - variety of support groups  [phone]- I7437963 Call for more information  Narcotics Anonymous (NA), [facility] [address], [employer] [place]  2 meetings at this location   Statistician         Address  Phone  Notes  [facility] [address],    [place]  [phone]   [facility]  [address] [place] [address], [place], [place] [phone]   [facility] [address] [place], [place] [phone] Admissions: 8am-3pm M-F  [facility] [address]    [place], [place] [phone]   [facility] [address] [place], [place], [place] [phone]   [facility] [address],  [place], [place] [phone]   [facility] - Intensive Outpatient [address] Laurell Josephs [address], [place], [place] [phone]   [facility] ([facility] [address] [place]  [place], [place] [phone] or [phone]   [facility] ([facility]) [address], [place], [place] [phone] Accepts Medicaid  [facility] [address],  [place] [phone] Substance Abuse/Addiction Treatment   [facility] Organization         Address  Phone  Notes  [facility]  [phone]   Angie Fava, PhD [address] Ervin Knack [place], [place]   [phone] or [phone]   [facility]   [address] [place], [place] [phone]   [facility] [address] [place], [place] [phone] Insurance/Medicaid/sponsorship through [employer] and Families [address]  [place], [place] [phone] [place], [place] [phone]    Dr. Adele Schilder  [phone]   [facility]  Dept. 1) [address] [place] 2) [place] 3)  [address], [place] [phone]  [phone]   [place] [phone] or [phone] (After Hours)

## 2015-02-09 NOTE — ED Provider Notes (Signed)
CSN: [id number]     Arrival date & time 02/08/15  1829 History   First MD Initiated Contact with Patient 02/08/15 2336     Chief Complaint  Patient presents with  . Abdominal Pain     (Consider location/radiation/quality/duration/timing/severity/associated sxs/prior Treatment) HPI Comments: Patient with no significant medical history presents with progressively worsening pain in the epigastrium, LUQ and LLQ abdomen for the past 8-9 months since the birth of her last child. No fever. She has nausea without vomiting. No change in bowel movements or stool color. The pain does not radiate to back or into chest. There is no SOB, dysuria, vaginal discharge or menstrual irregularity. She has tried Tylenol for the pain without relief. Nothing makes the pain better. The pain is described as constant, "there all through the day and night".   Patient is a 35 y.o. female presenting with abdominal pain. The history is provided by the patient. A language interpreter was used ([employer]).  Abdominal Pain Pain location:  Epigastric, LLQ and LUQ Pain quality: sharp   Pain radiates to:  Does not radiate Pain severity:  Moderate Onset quality:  Gradual Duration: 9 months. Timing:  Constant Progression:  Worsening Exacerbated by: Warm fluids. Ineffective treatments:  Acetaminophen Associated symptoms: nausea   Associated symptoms: no chest pain, no chills, no constipation, no dysuria, no fever, no shortness of breath, no vaginal discharge and no vomiting     Past Medical History  Diagnosis Date  . Tachycardia 2014  . Medical history non-contributory   . Blood transfusion without reported diagnosis     pp hemorrhage   Past Surgical History  Procedure Laterality Date  . Cesarean section      2003 (1st preg in [place])   Family History  Problem Relation Age of Onset  . Alcohol abuse Neg Hx   . Arthritis Neg Hx   . Asthma Neg Hx   . Birth defects Neg Hx   . Cancer Neg Hx    . COPD Neg Hx   . Depression Neg Hx   . Diabetes Neg Hx   . Drug abuse Neg Hx   . Early death Neg Hx   . Hearing loss Neg Hx   . Heart disease Neg Hx   . Hypertension Neg Hx   . Hyperlipidemia Neg Hx   . Kidney disease Neg Hx   . Learning disabilities Neg Hx   . Mental illness Neg Hx   . Mental retardation Neg Hx   . Miscarriages / Stillbirths Neg Hx   . Stroke Neg Hx   . Vision loss Neg Hx    Social History  Substance Use Topics  . Smoking status: Never Smoker   . Smokeless tobacco: Never Used  . Alcohol Use: No   OB History    Gravida Para Term Preterm AB TAB SAB Ectopic Multiple Living   0 0 0 0 0 6     Review of Systems  Constitutional: Negative for fever and chills.  HENT: Negative.   Respiratory: Negative.  Negative for shortness of breath.   Cardiovascular: Negative.  Negative for chest pain.  Gastrointestinal: Positive for nausea and abdominal pain. Negative for vomiting and constipation.  Genitourinary: Negative.  Negative for dysuria, vaginal discharge and menstrual problem.  Musculoskeletal: Negative.  Negative for back pain.  Neurological: Negative.       Allergies  Review of patient's allergies indicates no known allergies.  Home Medications   Prior to Admission  medications   Medication Sig Start Date End Date Taking? Authorizing Provider  ibuprofen (ADVIL,MOTRIN) 600 MG tablet Take 1 tablet (600 mg total) by mouth every 6 (six) hours as needed. Patient not taking: Reported on 05/28/2014 05/08/14   Arabella Merles, CNM  norethindrone (ORTHO MICRONOR) 0.35 MG tablet Take 1 tablet (0.35 mg total) by mouth daily. 06/15/14   Willodean Rosenthal, MD  prenatal vitamin w/FE, FA (PRENATAL 1 + 1) 27-1 MG TABS tablet Take 1 tablet by mouth daily at 12 noon.    Historical Provider, MD  sertraline (ZOLOFT) 50 MG tablet Take 1 tablet (50 mg total) by mouth daily. 06/15/14   Willodean Rosenthal, MD   BP 111/66 mmHg  Pulse 73  Temp(Src) 98.5 F (36.9  C) (Oral)  Resp 14  Wt 65.772 kg  SpO2 100%  Breastfeeding? Yes Physical Exam  Constitutional: She is oriented to person, place, and time. She appears well-developed and well-nourished. No distress.  HENT:  Head: Normocephalic.  Neck: Normal range of motion. Neck supple.  Cardiovascular: Normal rate.   Pulmonary/Chest: Effort normal.  Abdominal: Soft. Bowel sounds are normal. There is tenderness. There is no rebound and no guarding.  Tenderness to epigastric, LUQ and LLQ abdomen. No guarding or rebound. BS hypoactive.   Musculoskeletal: Normal range of motion.  Neurological: She is alert and oriented to person, place, and time.  Skin: Skin is warm and dry. No rash noted.  Psychiatric: She has a normal mood and affect.    ED Course  Procedures (including critical care time) Labs Review Labs Reviewed  COMPREHENSIVE METABOLIC PANEL - Abnormal; Notable for the following:    Chloride 112 (*)    Total Bilirubin 0.1 (*)    All other components within normal limits  LIPASE, BLOOD  CBC  URINALYSIS, ROUTINE W REFLEX MICROSCOPIC (NOT AT [facility])  POC URINE PREG, ED   Results for orders placed or performed during the hospital encounter of 02/08/15  Lipase, blood  Result Value Ref Range   Lipase 29 11 - 51 U/L  Comprehensive metabolic panel  Result Value Ref Range   Sodium 143 135 - 145 mmol/L   Potassium 3.7 3.5 - 5.1 mmol/L   Chloride 112 (H) 101 - 111 mmol/L   CO2 24 22 - 32 mmol/L   Glucose, Bld 91 65 - 99 mg/dL   BUN 11 6 - 20 mg/dL   Creatinine, Ser 1.61 0.44 - 1.00 mg/dL   Calcium 9.0 8.9 - 09.6 mg/dL   Total Protein 7.1 6.5 - 8.1 g/dL   Albumin 4.1 3.5 - 5.0 g/dL   AST 27 15 - 41 U/L   ALT 29 14 - 54 U/L   Alkaline Phosphatase 90 38 - 126 U/L   Total Bilirubin 0.1 (L) 0.3 - 1.2 mg/dL   GFR calc non Af Amer >60 >60 mL/min   GFR calc Af Amer >60 >60 mL/min   Anion gap 7 5 - 15  CBC  Result Value Ref Range   WBC 6.3 4.0 - 10.5 K/uL   RBC 4.70 3.87 - 5.11 MIL/uL    Hemoglobin 13.9 12.0 - 15.0 g/dL   HCT 04.5 40.9 - 81.1 %   MCV 88.7 78.0 - 100.0 fL   MCH 29.6 26.0 - 34.0 pg   MCHC 33.3 30.0 - 36.0 g/dL   RDW 91.4 78.2 - 95.6 %   Platelets 203 150 - 400 K/uL  Urinalysis, Routine w reflex microscopic (not at [facility])  Result Value Ref Range  Color, Urine YELLOW YELLOW   APPearance CLEAR CLEAR   Specific Gravity, Urine 1.025 1.005 - 1.030   pH 6.5 5.0 - 8.0   Glucose, UA NEGATIVE NEGATIVE mg/dL   Hgb urine dipstick NEGATIVE NEGATIVE   Bilirubin Urine NEGATIVE NEGATIVE   Ketones, ur NEGATIVE NEGATIVE mg/dL   Protein, ur NEGATIVE NEGATIVE mg/dL   Nitrite NEGATIVE NEGATIVE   Leukocytes, UA NEGATIVE NEGATIVE  POC urine preg, ED (not at [facility])  Result Value Ref Range   Preg Test, Ur NEGATIVE NEGATIVE   Dg Abd Acute W/chest  02/09/2015  CLINICAL DATA:  Acute onset of nausea. Chronic left-sided abdominal pain. Initial encounter. EXAM: DG ABDOMEN ACUTE W/ 1V CHEST COMPARISON:  Chest radiograph performed 11/18/2008, and CT of the abdomen and pelvis from 03/11/2013 FINDINGS: The lungs are well-aerated. Mild vascular congestion is noted. Peribronchial thickening is seen. There is no evidence of focal opacification, pleural effusion or pneumothorax. The cardiomediastinal silhouette is borderline enlarged. The visualized bowel gas pattern is unremarkable. Scattered stool and air are seen within the colon; there is no evidence of small bowel dilatation to suggest obstruction. No free intra-abdominal air is identified on the provided upright view. No acute osseous abnormalities are seen; the sacroiliac joints are unremarkable in appearance. IMPRESSION: 1. Mild vascular congestion and borderline cardiomegaly noted. Peribronchial thickening seen. 2. Unremarkable bowel gas pattern; no free intra-abdominal air seen. Moderate amount of stool noted in the colon. Electronically Signed   By: Roanna Raider M.D.   On: 02/09/2015 01:03     Imaging Review No results found. I  have personally reviewed and evaluated these images and lab results as part of my medical decision-making.   EKG Interpretation None      MDM   Final diagnoses:  None    1. Abdominal pain 2. Constipation  Patient presents for evaluation of pain in abdomen for the past 9 months, every day. No fever, vomiting, diarrhea. No better with Tylenol - has not tried anything else.   Labs completely reassuring. Abdomen soft, tender to epigastrium and left side. Imaging showing constipation. Given duration of symptoms and non-toxic appearance of patient with normal lab/imaging, feel she can be safely discharged home with PCP follow up for persistent pain and further evaluation. Resources provided.    Elpidio Anis, PA-C 02/09/15 0144  Tilden Fossa, MD 02/09/15 [phone]

## 2015-08-08 ENCOUNTER — Emergency Department (HOSPITAL_COMMUNITY)
Admission: EM | Admit: 2015-08-08 | Discharge: 2015-08-08 | Disposition: A | Discharge status: Home / Self Care | Payer: Self-pay | Attending: Physician Assistant | Admitting: Physician Assistant

## 2015-08-08 ENCOUNTER — Emergency Department (HOSPITAL_COMMUNITY): Payer: Self-pay

## 2015-08-08 ENCOUNTER — Encounter (HOSPITAL_COMMUNITY): Payer: Self-pay | Admitting: *Deleted

## 2015-08-08 DIAGNOSIS — R102 Pelvic and perineal pain: Secondary | ICD-10-CM | POA: Insufficient documentation

## 2015-08-08 DIAGNOSIS — N939 Abnormal uterine and vaginal bleeding, unspecified: Secondary | ICD-10-CM | POA: Insufficient documentation

## 2015-08-08 LAB — CBC WITH DIFFERENTIAL/PLATELET
Basophils Absolute: 0 10*3/uL (ref 0.0–0.1)
Basophils Relative: 0 %
EOS ABS: 0.2 10*3/uL (ref 0.0–0.7)
EOS PCT: 3 %
HCT: 40.7 % (ref 36.0–46.0)
HEMOGLOBIN: 13.8 g/dL (ref 12.0–15.0)
LYMPHS ABS: 1.4 10*3/uL (ref 0.7–4.0)
LYMPHS PCT: 23 %
MCH: 28.8 pg (ref 26.0–34.0)
MCHC: 33.9 g/dL (ref 30.0–36.0)
MCV: 84.8 fL (ref 78.0–100.0)
MONOS PCT: 5 %
Monocytes Absolute: 0.3 10*3/uL (ref 0.1–1.0)
NEUTROS PCT: 69 %
Neutro Abs: 4.2 10*3/uL (ref 1.7–7.7)
Platelets: 197 10*3/uL (ref 150–400)
RBC: 4.8 MIL/uL (ref 3.87–5.11)
RDW: 13.6 % (ref 11.5–15.5)
WBC: 6.1 10*3/uL (ref 4.0–10.5)

## 2015-08-08 LAB — WET PREP, GENITAL
Clue Cells Wet Prep HPF POC: NONE SEEN
Sperm: NONE SEEN
TRICH WET PREP: NONE SEEN
YEAST WET PREP: NONE SEEN

## 2015-08-08 LAB — I-STAT CHEM 8, ED
BUN: 11 mg/dL (ref 6–20)
Calcium, Ion: 1.23 mmol/L (ref 1.13–1.30)
Chloride: 108 mmol/L (ref 101–111)
Creatinine, Ser: 0.8 mg/dL (ref 0.44–1.00)
Glucose, Bld: 95 mg/dL (ref 65–99)
HEMATOCRIT: 42 % (ref 36.0–46.0)
HEMOGLOBIN: 14.3 g/dL (ref 12.0–15.0)
POTASSIUM: 3.7 mmol/L (ref 3.5–5.1)
SODIUM: 143 mmol/L (ref 135–145)
TCO2: 24 mmol/L (ref 0–100)

## 2015-08-08 LAB — I-STAT BETA HCG BLOOD, ED (MC, WL, AP ONLY): I-stat hCG, quantitative: <5 m[IU]/mL (ref <5)

## 2015-08-08 NOTE — ED Provider Notes (Signed)
CSN: [id number]     Arrival date & time 08/08/15  1529 History   First MD Initiated Contact with Patient 08/08/15 1600     Chief Complaint  Patient presents with  . Vaginal Bleeding     (Consider location/radiation/quality/duration/timing/severity/associated sxs/prior Treatment) HPI Comments: Patient presents to the ED with a chief complaint of vaginal bleeding.  She states that she has been having vaginal bleeding intermittently for the past 6 weeks.  She states that she has been using 7-8 pads per day.  She reports some associated left sided pelvic pain as well as nausea.  Last true menstrual period unknown.  States that she has felt a little light-headed.  Denies any CP or SOB.  The history is provided by the patient. No language interpreter was used.    History reviewed. No pertinent past medical history. Past Surgical History  Procedure Laterality Date  . Cesarean section     No family history on file. Social History  Substance Use Topics  . Smoking status: Never Smoker   . Smokeless tobacco: None  . Alcohol Use: No   OB History    No data available     Review of Systems  Constitutional: Negative for fever and chills.  Respiratory: Negative for shortness of breath.   Cardiovascular: Negative for chest pain.  Gastrointestinal: Negative for nausea, vomiting, diarrhea and constipation.  Genitourinary: Positive for vaginal bleeding. Negative for dysuria.  All other systems reviewed and are negative.     Allergies  Review of patient's allergies indicates no known allergies.  Home Medications   Prior to Admission medications   Not on File   BP 112/65 mmHg  Pulse 64  Temp(Src) 98.9 F (37.2 C) (Oral)  Resp 18  SpO2 99% Physical Exam  Constitutional: She is oriented to person, place, and time. She appears well-developed and well-nourished.  HENT:  Head: Normocephalic and atraumatic.  Eyes: Conjunctivae and EOM are normal. Pupils are equal, round, and reactive  to light.  Neck: Normal range of motion. Neck supple.  Cardiovascular: Normal rate and regular rhythm.  Exam reveals no gallop and no friction rub.   No murmur heard. Pulmonary/Chest: Effort normal and breath sounds normal. No respiratory distress. She has no wheezes. She has no rales. She exhibits no tenderness.  Abdominal: Soft. Bowel sounds are normal. She exhibits no distension and no mass. There is no tenderness. There is no rebound and no guarding.  Genitourinary:  Pelvic exam chaperoned by female ER tech, no right adnexal tenderness, moderate left adnexal tenderness, no uterine tenderness, mild brown vaginal discharge/old blood, no active bleeding, no CMT or friability, no foreign body, no injury to the external genitalia, no other significant findings   Musculoskeletal: Normal range of motion. She exhibits no edema or tenderness.  Neurological: She is alert and oriented to person, place, and time.  Skin: Skin is warm and dry.  Psychiatric: She has a normal mood and affect. Her behavior is normal. Judgment and thought content normal.  Nursing note and vitals reviewed.   ED Course  Procedures (including critical care time) Results for orders placed or performed during the hospital encounter of 08/08/15  Wet prep, genital  Result Value Ref Range   Yeast Wet Prep HPF POC NONE SEEN NONE SEEN   Trich, Wet Prep NONE SEEN NONE SEEN   Clue Cells Wet Prep HPF POC NONE SEEN NONE SEEN   WBC, Wet Prep HPF POC MANY (A) NONE SEEN   Sperm NONE SEEN  CBC with Differential/Platelet  Result Value Ref Range   WBC 6.1 4.0 - 10.5 K/uL   RBC 4.80 3.87 - 5.11 MIL/uL   Hemoglobin 13.8 12.0 - 15.0 g/dL   HCT 95.240.7 84.136.0 - 32.446.0 %   MCV 84.8 78.0 - 100.0 fL   MCH 28.8 26.0 - 34.0 pg   MCHC 33.9 30.0 - 36.0 g/dL   RDW 40.113.6 02.711.5 - 25.315.5 %   Platelets 197 150 - 400 K/uL   Neutrophils Relative % 69 %   Neutro Abs 4.2 1.7 - 7.7 K/uL   Lymphocytes Relative 23 %   Lymphs Abs 1.4 0.7 - 4.0 K/uL    Monocytes Relative 5 %   Monocytes Absolute 0.3 0.1 - 1.0 K/uL   Eosinophils Relative 3 %   Eosinophils Absolute 0.2 0.0 - 0.7 K/uL   Basophils Relative 0 %   Basophils Absolute 0.0 0.0 - 0.1 K/uL  I-stat chem 8, ed  Result Value Ref Range   Sodium 143 135 - 145 mmol/L   Potassium 3.7 3.5 - 5.1 mmol/L   Chloride 108 101 - 111 mmol/L   BUN 11 6 - 20 mg/dL   Creatinine, Ser 6.640.80 0.44 - 1.00 mg/dL   Glucose, Bld 95 65 - 99 mg/dL   Calcium, Ion 4.031.23 4.741.13 - 1.30 mmol/L   TCO2 24 0 - 100 mmol/L   Hemoglobin 14.3 12.0 - 15.0 g/dL   HCT 25.942.0 56.336.0 - 87.546.0 %  I-Stat Beta hCG blood, ED ([facility], [facility], [facility] only)  Result Value Ref Range   I-stat hCG, quantitative <5.0 <5 mIU/mL   Comment 3           [place] Transvaginal Non-ob  08/08/2015  CLINICAL DATA:  Left adnexal pain EXAM: TRANSABDOMINAL AND TRANSVAGINAL ULTRASOUND OF PELVIS DOPPLER ULTRASOUND OF OVARIES TECHNIQUE: Both transabdominal and transvaginal ultrasound examinations of the pelvis were performed. Transabdominal technique was performed for global imaging of the pelvis including uterus, ovaries, adnexal regions, and pelvic cul-de-sac. It was necessary to proceed with endovaginal exam following the transabdominal exam to visualize the endometrium and ovaries. Color and duplex Doppler ultrasound was utilized to evaluate blood flow to the ovaries. COMPARISON:  None. FINDINGS: Uterus Measurements: 7 x 3.8 x 4.8 cm. No fibroids or other mass visualized. Endometrium Thickness: 6.3 mm.  No focal abnormality visualized. Right ovary Measurements: 2.9 x 2.1 x 3.1 cm. Normal appearance/no adnexal mass. Left ovary Measurements: 2.6 x 2.7 x 3 cm. Normal appearance/no adnexal mass. Pulsed Doppler evaluation of both ovaries demonstrates normal low-resistance arterial and venous waveforms. Other findings No abnormal free fluid. IMPRESSION: 1. Normal pelvic ultrasound. 2. No ovarian torsion. Electronically Signed   By: Elige KoHetal  Patel   On: 08/08/2015 18:17   [place] Pelvis  Complete  08/08/2015  CLINICAL DATA:  Left adnexal pain EXAM: TRANSABDOMINAL AND TRANSVAGINAL ULTRASOUND OF PELVIS DOPPLER ULTRASOUND OF OVARIES TECHNIQUE: Both transabdominal and transvaginal ultrasound examinations of the pelvis were performed. Transabdominal technique was performed for global imaging of the pelvis including uterus, ovaries, adnexal regions, and pelvic cul-de-sac. It was necessary to proceed with endovaginal exam following the transabdominal exam to visualize the endometrium and ovaries. Color and duplex Doppler ultrasound was utilized to evaluate blood flow to the ovaries. COMPARISON:  None. FINDINGS: Uterus Measurements: 7 x 3.8 x 4.8 cm. No fibroids or other mass visualized. Endometrium Thickness: 6.3 mm.  No focal abnormality visualized. Right ovary Measurements: 2.9 x 2.1 x 3.1 cm. Normal appearance/no adnexal mass. Left ovary Measurements: 2.6 x 2.7  x 3 cm. Normal appearance/no adnexal mass. Pulsed Doppler evaluation of both ovaries demonstrates normal low-resistance arterial and venous waveforms. Other findings No abnormal free fluid. IMPRESSION: 1. Normal pelvic ultrasound. 2. No ovarian torsion. Electronically Signed   By: Elige Ko   On: 08/08/2015 18:17   [place] Art/ven Flow Abd Pelv Doppler  08/08/2015  CLINICAL DATA:  Left adnexal pain EXAM: TRANSABDOMINAL AND TRANSVAGINAL ULTRASOUND OF PELVIS DOPPLER ULTRASOUND OF OVARIES TECHNIQUE: Both transabdominal and transvaginal ultrasound examinations of the pelvis were performed. Transabdominal technique was performed for global imaging of the pelvis including uterus, ovaries, adnexal regions, and pelvic cul-de-sac. It was necessary to proceed with endovaginal exam following the transabdominal exam to visualize the endometrium and ovaries. Color and duplex Doppler ultrasound was utilized to evaluate blood flow to the ovaries. COMPARISON:  None. FINDINGS: Uterus Measurements: 7 x 3.8 x 4.8 cm. No fibroids or other mass visualized.  Endometrium Thickness: 6.3 mm.  No focal abnormality visualized. Right ovary Measurements: 2.9 x 2.1 x 3.1 cm. Normal appearance/no adnexal mass. Left ovary Measurements: 2.6 x 2.7 x 3 cm. Normal appearance/no adnexal mass. Pulsed Doppler evaluation of both ovaries demonstrates normal low-resistance arterial and venous waveforms. Other findings No abnormal free fluid. IMPRESSION: 1. Normal pelvic ultrasound. 2. No ovarian torsion. Electronically Signed   By: Elige Ko   On: 08/08/2015 18:17    I have personally reviewed and evaluated these images and lab results as part of my medical decision-making.    MDM   Final diagnoses:  Adnexal pain  Vaginal bleeding    Patient with vaginal bleeding and left adnexal pain.  Onset 6 weeks ago.    Bleeding has been intermittent.  No bleeding on exam.  Will check labs.  H/H is stable.  Pregnancy test is negative.  Pelvic US is negative.  Recommend OBGYN follow-up.    Roxy Horseman, PA-C 08/08/15 1846  Courteney Randall An, MD 08/08/15 2042

## 2015-08-08 NOTE — ED Notes (Signed)
Pts translater reports pt has had vaginal bleeding for about 6 weeks, 7-8 pads a day, did stop bleeding for about a week then started back, lower abd pain, nausea, denies diarrhea, no birth control

## 2015-08-08 NOTE — Discharge Instructions (Signed)
Sangrado uterino anormal  (Abnormal Uterine Bleeding)  El sangrado uterino anormal puede afectar a las mujeres que están en diversas etapas de la vida, desde adolescentes, mujeres fértiles y mujeres embarazadas, hasta mujeres que han llegado a la menopausia. Hay diversas clases de sangrado uterino que se consideran anormales, entre ellas:  · Pérdidas de sangre o hemorragias entre los períodos.  · Hemorragias luego de mantener relaciones sexuales.  · Sangrado abundante o más que lo habitual.  · Períodos que duran más que lo normal.  · Sangrado luego de la menopausia.  Muchos casos de sangrado uterino anormal son leves y simples de tratar, mientras que otros son más graves. El médico debe evaluar cualquier clase de sangrado anormal. El tratamiento dependerá de la causa del sangrado.  INSTRUCCIONES PARA EL CUIDADO EN EL HOGAR  Controle su afección para ver si hay cambios. Las siguientes indicaciones ayudarán a aliviar cualquier molestia que pueda sentir:  · Evite las duchas vaginales y el uso de tampones según las indicaciones del médico.  · Cámbiese las compresas con frecuencia.  Deberá hacerse exámenes pélvicos regulares y pruebas de Papanicolaou. Cumpla con todas las visitas de control y exámanes diagnósticos, según le indique su médico.   SOLICITE ATENCIÓN MÉDICA SI:   · El sangrado dura más de 1 semana.  · Se siente mareada por momentos.  SOLICITE ATENCIÓN MÉDICA DE INMEDIATO SI:   · Se desmaya.  · Debe cambiarse la compresa cada 15 a 30 minutos.  · Siente dolor abdominal.  · Tiene fiebre.  · Se siente débil o presenta sudoración.  · Elimina coágulos grandes por la vagina.  · Comienza a sentir náuseas y vomita.  ASEGÚRESE DE QUE:   · Comprende estas instrucciones.  · Controlará su afección.  · Recibirá ayuda de inmediato si no mejora o si empeora.     Esta información no tiene como fin reemplazar el consejo del médico. Asegúrese de hacerle al médico cualquier pregunta que tenga.     Document Released: 01/09/2005  Document Revised: 01/14/2013  Elsevier Interactive Patient Education ©2016 [employer]

## 2015-08-09 LAB — GC/CHLAMYDIA PROBE AMP (~~LOC~~) NOT AT ARMC
CHLAMYDIA, DNA PROBE: NEGATIVE
Neisseria Gonorrhea: NEGATIVE

## 2015-09-25 IMAGING — US US OB COMP +14 WK
1 series · 12 of 28 positions shown · non-contrast
Comparison: none

[Series 1: us ob comp +14 wk mfm · 138 acquisitions, 12 frames shown]
[im 6/138]
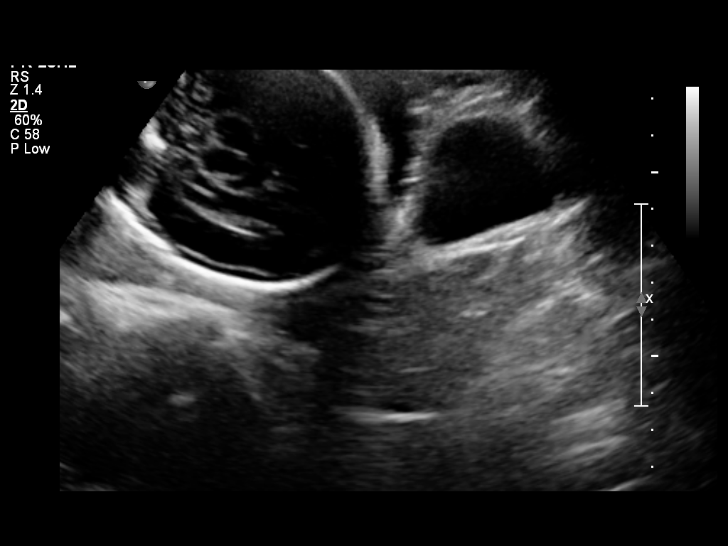
[im 16/138]
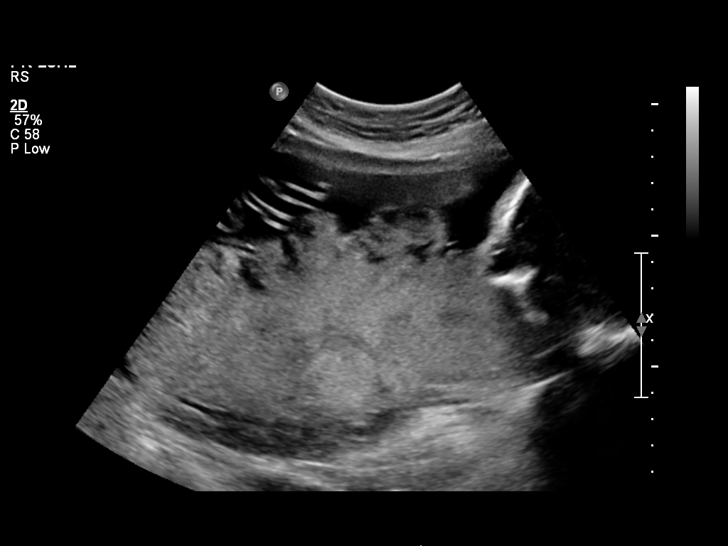
[im 26/138]
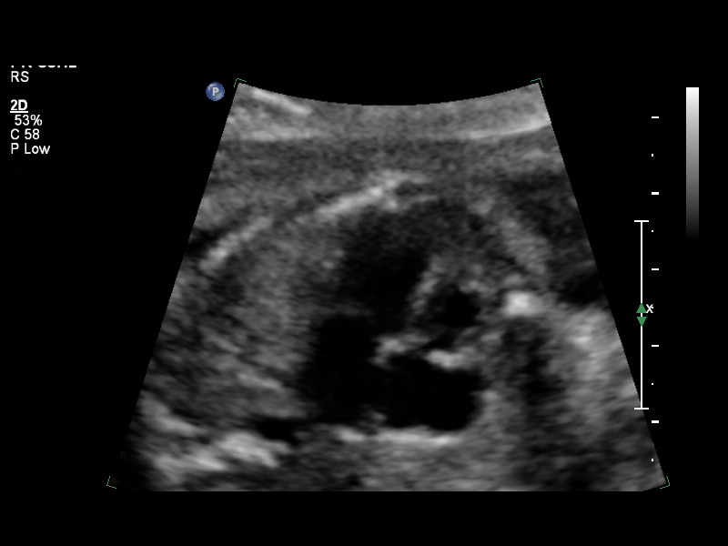
[im 41/138]
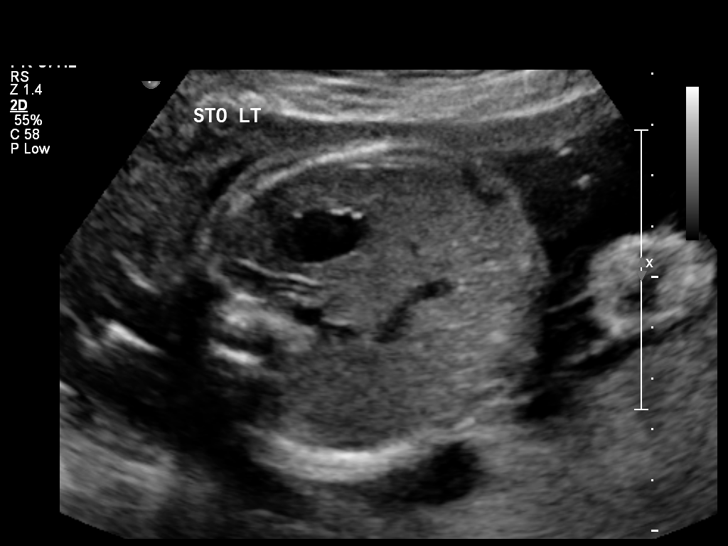
[im 51/138]
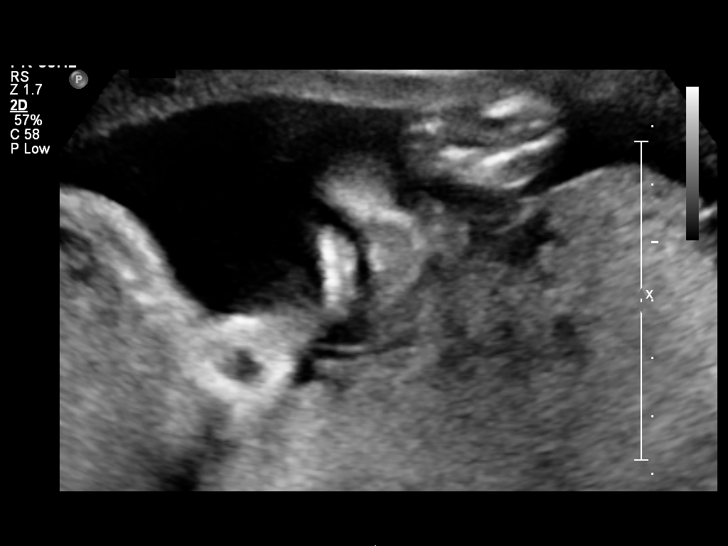
[im 61/138]
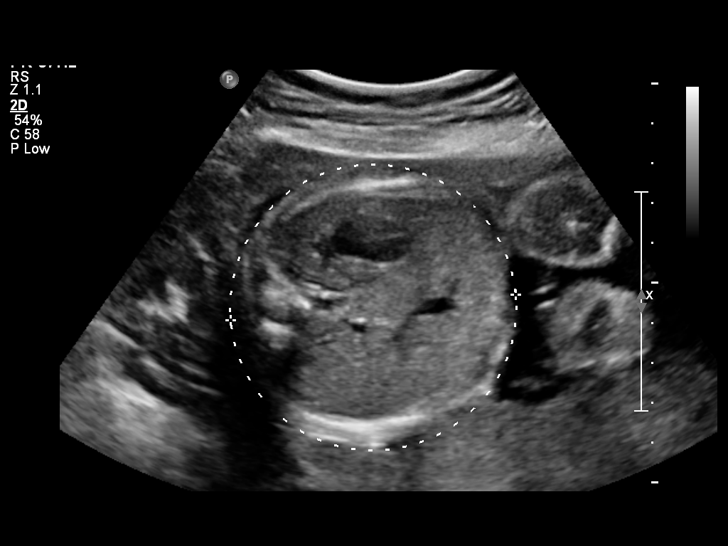
[im 77/138]
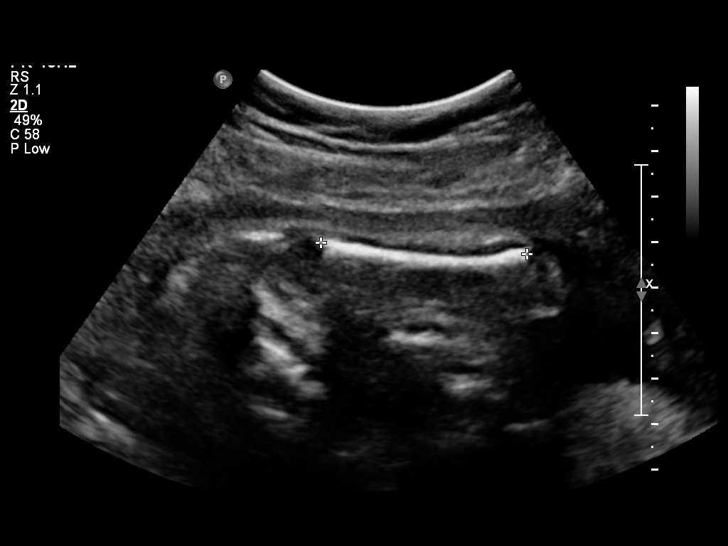
[im 87/138]
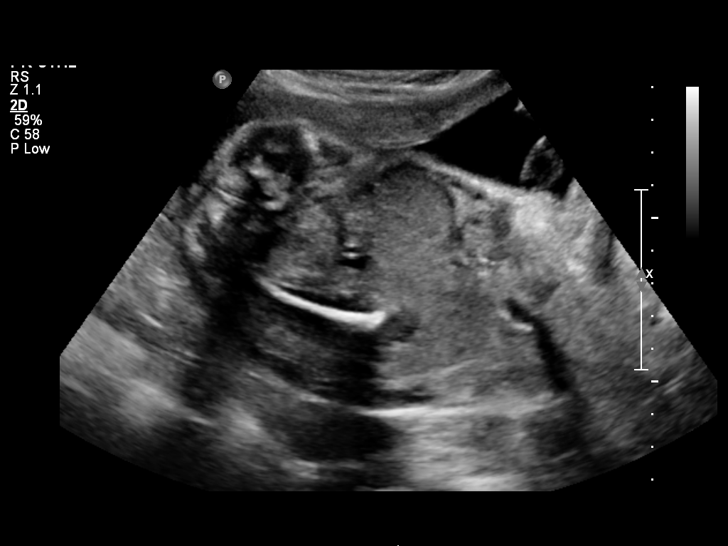
[im 97/138]
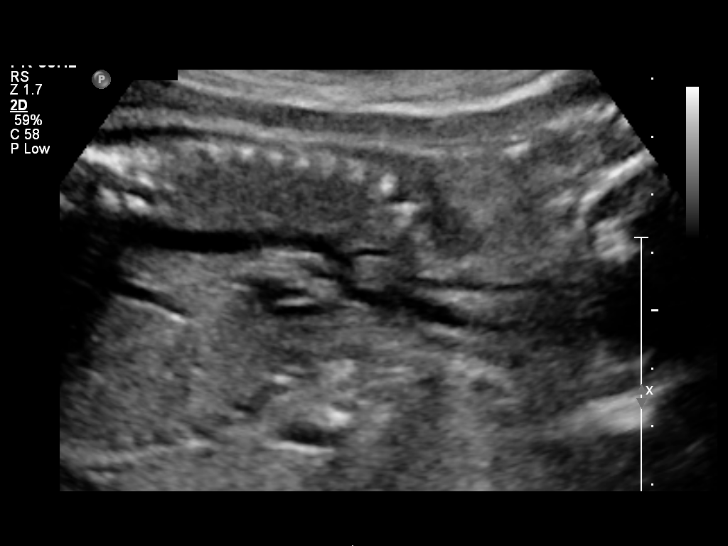
[im 112/138]
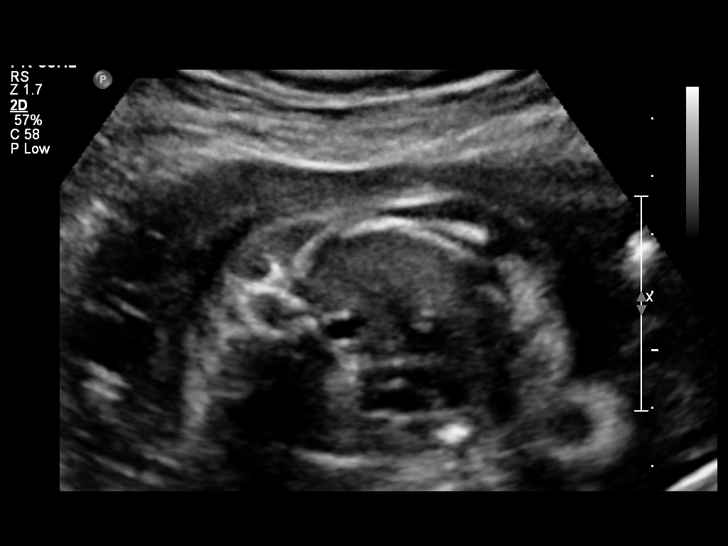
[im 122/138]
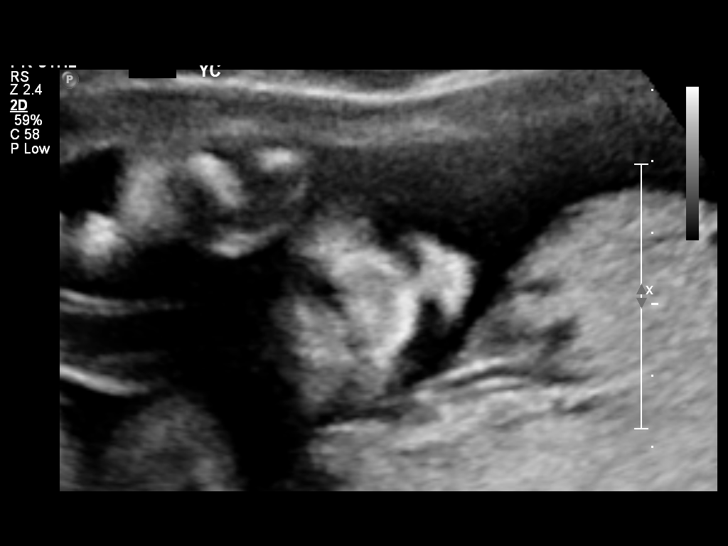
[im 132/138]
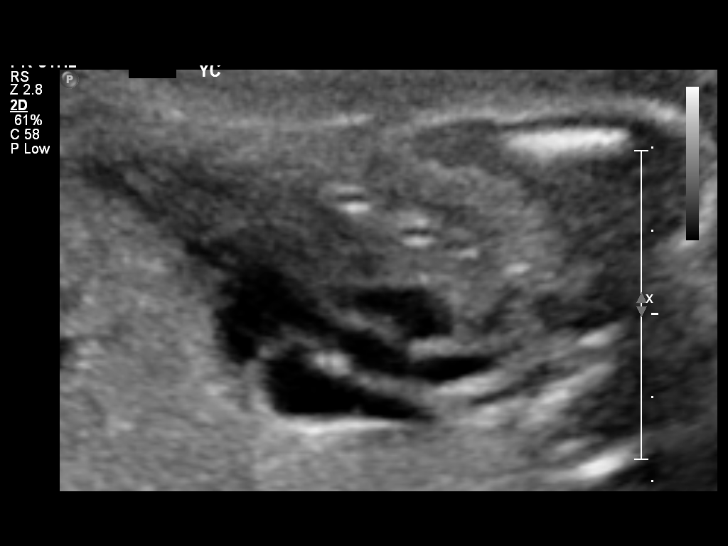

[12 of 28 positions shown; findings below may reference images not displayed]

OBSTETRICS REPORT
                      (Signed Final 02/06/2014 [DATE])

             JIM

Service(s) Provided

 US OB COMP + 14 WK                                    76805.1
Indications

 27 weeks gestation of pregnancy
 Basic anatomic survey                                 [id number]
Fetal Evaluation

 Num Of Fetuses:    1
 Fetal Heart Rate:  144                          bpm
 Cardiac Activity:  Observed
 Presentation:      Cephalic
 Placenta:          Left lateral, above cervical
                    os
 P. Cord            Visualized, central
 Insertion:

 Amniotic Fluid
 AFI FV:      Subjectively within normal limits
 AFI Sum:     12.66   cm       33  %Tile     Larg Pckt:    3.74  cm
 RUQ:   3.74    cm   RLQ:    2.68   cm    LUQ:   3.35    cm   LLQ:    2.89   cm
Biometry

 BPD:       67  mm     G. Age:  27w 0d                CI:         68.7   70 - 86
                                                      FL/HC:      18.8   18.6 -

 HC:     258.3  mm     G. Age:  28w 0d       42  %    HC/AC:      1.15   1.05 -

 AC:     224.3  mm     G. Age:  26w 6d       25  %    FL/BPD:     72.5   71 - 87
 FL:      48.6  mm     G. Age:  26w 2d       10  %    FL/AC:      21.7   20 - 24
 HUM:     45.3  mm     G. Age:  26w 6d       32  %
 CER:     29.3  mm     G. Age:  26w 1d       23  %

 Est. FW:     983  gm      2 lb 3 oz     38  %
Gestational Age

 LMP:           27w 3d        Date:  07/29/13                 EDD:   05/05/14
 U/S Today:     27w 0d                                        EDD:   05/08/14
 Best:          27w 3d     Det. By:  LMP  (07/29/13)          EDD:   05/05/14
Anatomy

 Cranium:          Appears normal         Aortic Arch:      Appears normal
 Fetal Cavum:      Appears normal         Ductal Arch:      Appears normal
 Ventricles:       Appears normal         Diaphragm:        Appears normal
 Choroid Plexus:   Appears normal         Stomach:          Appears normal, left
                                                            sided
 Cerebellum:       Appears normal         Abdomen:          Appears normal
 Posterior Fossa:  Appears normal         Abdominal Wall:   Not well visualized
 Nuchal Fold:      Not applicable (>20    Cord Vessels:     Appears normal (3
                   wks GA)                                  vessel cord)
 Face:             Appears normal         Kidneys:          Appear normal
                   (orbits and profile)
 Lips:             Appears normal         Bladder:          Appears normal
 Heart:            Appears normal         Spine:            Appears normal
                   (4CH, axis, and
                   situs)
 RVOT:             Appears normal         Lower             Appears normal
                                          Extremities:
 LVOT:             Appears normal         Upper             Appears normal
                                          Extremities:

 Other:  Fetus appears to be a male. Nasal bone visualized. Technically
         difficult due to fetal position.
Cervix Uterus Adnexa

 Cervical Length:    3.7      cm

 Cervix:       Normal appearance by transabdominal scan.

 Left Ovary:    Within normal limits.
 Right Ovary:   Within normal limits.
 Adnexa:     No adnexal mass visualized.
Impression

 Single IUP at 27w 3d
 Normal fetal anatomic survey
 The estimated fetal weight today is at the 38th %tile.
 Left lateral placenta without previa
 Normal amniotic fluid volume
Recommendations

 Follow-up ultrasounds as clinically indicated.

 PATRICIO SEBASTIAN with us.  Please do not hesitate to

## 2015-11-15 ENCOUNTER — Other Ambulatory Visit (HOSPITAL_COMMUNITY): Payer: Self-pay | Admitting: Radiology

## 2015-11-15 ENCOUNTER — Encounter (HOSPITAL_COMMUNITY): Payer: Self-pay

## 2015-11-15 ENCOUNTER — Emergency Department (HOSPITAL_COMMUNITY)
Admission: EM | Admit: 2015-11-15 | Discharge: 2015-11-16 | Disposition: A | Discharge status: Home / Self Care | Payer: Self-pay | Attending: Emergency Medicine | Admitting: Emergency Medicine

## 2015-11-15 ENCOUNTER — Emergency Department (HOSPITAL_COMMUNITY): Payer: Self-pay

## 2015-11-15 DIAGNOSIS — N739 Female pelvic inflammatory disease, unspecified: Secondary | ICD-10-CM | POA: Insufficient documentation

## 2015-11-15 DIAGNOSIS — N730 Acute parametritis and pelvic cellulitis: Secondary | ICD-10-CM

## 2015-11-15 DIAGNOSIS — R102 Pelvic and perineal pain: Secondary | ICD-10-CM

## 2015-11-15 LAB — URINALYSIS, ROUTINE W REFLEX MICROSCOPIC
BILIRUBIN URINE: NEGATIVE
GLUCOSE, UA: NEGATIVE mg/dL
Hgb urine dipstick: NEGATIVE
Ketones, ur: NEGATIVE mg/dL
Leukocytes, UA: NEGATIVE
NITRITE: NEGATIVE
Protein, ur: NEGATIVE mg/dL
SPECIFIC GRAVITY, URINE: 1.026 (ref 1.005–1.030)
pH: 7 (ref 5.0–8.0)

## 2015-11-15 LAB — COMPREHENSIVE METABOLIC PANEL
ALBUMIN: 4.5 g/dL (ref 3.5–5.0)
ALT: 14 U/L (ref 14–54)
ANION GAP: 8 (ref 5–15)
AST: 21 U/L (ref 15–41)
Alkaline Phosphatase: 73 U/L (ref 38–126)
BUN: 13 mg/dL (ref 6–20)
CHLORIDE: 104 mmol/L (ref 101–111)
CO2: 25 mmol/L (ref 22–32)
Calcium: 9.3 mg/dL (ref 8.9–10.3)
Creatinine, Ser: 0.95 mg/dL (ref 0.44–1.00)
GFR calc Af Amer: >60 mL/min (ref >60)
GFR calc non Af Amer: >60 mL/min (ref >60)
GLUCOSE: 95 mg/dL (ref 65–99)
POTASSIUM: 3.6 mmol/L (ref 3.5–5.1)
SODIUM: 137 mmol/L (ref 135–145)
TOTAL PROTEIN: 7.2 g/dL (ref 6.5–8.1)
Total Bilirubin: 0.8 mg/dL (ref 0.3–1.2)

## 2015-11-15 LAB — CBC
HEMATOCRIT: 41.2 % (ref 36.0–46.0)
HEMOGLOBIN: 14.1 g/dL (ref 12.0–15.0)
MCH: 29.4 pg (ref 26.0–34.0)
MCHC: 34.2 g/dL (ref 30.0–36.0)
MCV: 85.8 fL (ref 78.0–100.0)
Platelets: 219 10*3/uL (ref 150–400)
RBC: 4.8 MIL/uL (ref 3.87–5.11)
RDW: 12.5 % (ref 11.5–15.5)
WBC: 8.2 10*3/uL (ref 4.0–10.5)

## 2015-11-15 LAB — POC URINE PREG, ED: Preg Test, Ur: NEGATIVE

## 2015-11-15 LAB — LIPASE, BLOOD: LIPASE: 29 U/L (ref 11–51)

## 2015-11-15 MED ORDER — AZITHROMYCIN 250 MG PO TABS: 1000.0000 mg | ORAL_TABLET | Freq: Once | ORAL | Status: DC

## 2015-11-15 MED ORDER — DOXYCYCLINE HYCLATE 100 MG PO CAPS: 100.0000 mg | ORAL_CAPSULE | Freq: Two times a day (BID) | ORAL | 0 refills | Status: AC

## 2015-11-15 MED ORDER — HYDROCODONE-ACETAMINOPHEN 5-325 MG PO TABS: 1.0000 | ORAL_TABLET | Freq: Four times a day (QID) | ORAL | 0 refills | Status: DC | PRN

## 2015-11-15 MED ORDER — METRONIDAZOLE 500 MG PO TABS: 500.0000 mg | ORAL_TABLET | Freq: Two times a day (BID) | ORAL | 0 refills | Status: AC

## 2015-11-15 MED ORDER — ONDANSETRON HCL 4 MG PO TABS: 4.0000 mg | ORAL_TABLET | Freq: Three times a day (TID) | ORAL | 0 refills | Status: DC | PRN

## 2015-11-15 MED ORDER — ONDANSETRON 4 MG PO TBDP
8.0000 mg | ORAL_TABLET | Freq: Once | ORAL | Status: AC
  Administered 2015-11-15: 8 mg via ORAL
  Filled 2015-11-15: qty 2

## 2015-11-15 MED ORDER — OXYCODONE-ACETAMINOPHEN 5-325 MG PO TABS
1.0000 | ORAL_TABLET | Freq: Once | ORAL | Status: AC
  Administered 2015-11-15: 1 via ORAL
  Filled 2015-11-15: qty 1

## 2015-11-15 MED ORDER — CEFTRIAXONE SODIUM 250 MG IJ SOLR
250.0000 mg | Freq: Once | INTRAMUSCULAR | Status: AC
  Administered 2015-11-16: 250 mg via INTRAMUSCULAR
  Filled 2015-11-15: qty 250

## 2015-11-15 MED ORDER — METRONIDAZOLE 500 MG PO TABS
500.0000 mg | ORAL_TABLET | Freq: Once | ORAL | Status: AC
  Administered 2015-11-16: 500 mg via ORAL
  Filled 2015-11-15: qty 1

## 2015-11-15 MED ORDER — DOXYCYCLINE HYCLATE 100 MG PO TABS
100.0000 mg | ORAL_TABLET | Freq: Once | ORAL | Status: AC
  Administered 2015-11-16: 100 mg via ORAL
  Filled 2015-11-15: qty 1

## 2015-11-15 MED ORDER — IBUPROFEN 800 MG PO TABS: 800.0000 mg | ORAL_TABLET | Freq: Three times a day (TID) | ORAL | 0 refills | Status: DC

## 2015-11-15 NOTE — ED Notes (Signed)
Pt reports as 2 week long fever 2 weeks ago

## 2015-11-15 NOTE — ED Notes (Signed)
Pt speaks spannish---interpreter phone used

## 2015-11-15 NOTE — ED Provider Notes (Signed)
[facility]-EMERGENCY DEPT Provider Note   CSN: [id number] Arrival date & time: 11/15/15  1754     History   Chief Complaint Chief Complaint  Patient presents with  . Abdominal Pain    HPI Rhonda Townsend is a 35 y.o. female.  Patient is a 35 year old female who presents emergency Department complaining of generalized lower abdominal pain that is sharp, constant and gradually worsening over the past "couple months", became much more severe over the past 2 days with radiation into her left groin.  She has noticed that since having the abdominal discomfort she has had increasing frequency of her menses, LMP 2 weeks ago on her cycle lasted for one whole week. She noticed worse pain when she has vaginal bleeding. She denies any history of irregular periods.  Her low abdominal pain is most severe in superior pubic area and left lower quadrant, associated with dyspareunia for the past 1-1/2 months, mild nausea, urinary frequency. She denies vaginal discharge, genital rash or irritation. She is not currently having fevers but she states that a month ago she began having subjective fevers every night for 2 weeks straight and then subsided.  With the increased pain over the past 2 days she has felt lightheaded she denies syncope, shortness of breath, palpitations, chest pain.  Pt is monogamous with her husband of 15 years, G6.  She had a normal vaginal delivery a month ago, with no complications.  She has not seen an OB/GYN for her problems.   She denies dysuria, hematuria, urinary urgency, vomiting, diarrhea, constipation. No other acute or associated symptoms.    The history is provided by the patient. The history is limited by a language barrier. A language interpreter was used.  Abdominal Pain   This is a new problem. Episode onset: 2 months. The problem occurs constantly. The problem has been gradually worsening. The pain is located in the suprapubic region and LLQ. The quality of the  pain is sharp and cramping. The pain is at a severity of 8/10. The pain is severe. Associated symptoms include fever, nausea and frequency. Pertinent negatives include anorexia, belching, diarrhea, flatus, hematochezia, melena, vomiting, constipation, dysuria, hematuria, headaches, arthralgias and myalgias. The symptoms are aggravated by palpation (walking). Nothing relieves the symptoms. Past workup comments: none.  Vaginal Bleeding  Primary symptoms include pelvic pain, dyspareunia, vaginal bleeding.  Primary symptoms include no discharge, no genital lesions, no genital pain, no genital rash, no genital itching, no genital odor, no dysuria. Maximum temperature: 2 weeks of subjective fever at night, resolved 2 weeks ago. This is a recurrent problem. Episode onset: 2 months ago. Episode frequency: every few weeks. The problem has been gradually worsening. The symptoms occur during menstruation. She is not pregnant. She has not missed her period. Her LMP was weeks ago. The patient's menstrual history has been regular. Associated symptoms include abdominal pain, nausea and frequency. Pertinent negatives include no anorexia, no constipation, no diarrhea and no vomiting. She has tried nothing for the symptoms. The treatment provided no relief. Sexual activity: non-contributory. There is no concern regarding sexually transmitted diseases. She uses nothing for contraception. Associated medical issues include ovarian cysts (incidental finding on routine OB US). Associated medical issues do not include STD, PID, herpes simplex, perineal abscess, vaginosis, hypermenorrhea, endometriosis, gynecological surgery, ectopic pregnancy, Cesarean section, miscarriage or terminated pregnancy.       Past Medical History:  Diagnosis Date  . Blood transfusion without reported diagnosis    pp hemorrhage  . Medical history non-contributory   .  Tachycardia 2014    Patient Active Problem List   Diagnosis Date Noted  .  Amniotic fluid leaking 05/07/2014  . Language barrier, speaks Spanish only 03/11/2014  . Traumatic injury during pregnancy, antepartum   . Postpartum depression, postpartum condition 07/17/2012  . Supervision of other normal pregnancy 06/12/2012  . Previous cesarean delivery, antepartum condition or complication 02/28/2012    Past Surgical History:  Procedure Laterality Date  . CESAREAN SECTION     2003 (1st preg in [place])    [place] History    Gravida Para Term Preterm AB Living   6 6 5 1  0 6   SAB TAB Ectopic Multiple Live Births   0 0 0 0 6       Home Medications    Prior to Admission medications   Medication Sig Start Date End Date Taking? Authorizing Provider  doxycycline (VIBRAMYCIN) 100 MG capsule Take 1 capsule (100 mg total) by mouth 2 (two) times daily. 11/15/15 11/29/15  Danelle Berry, PA-C  HYDROcodone-acetaminophen (NORCO/VICODIN) 5-325 MG tablet Take 1 tablet by mouth every 6 (six) hours as needed for severe pain. 11/15/15   Danelle Berry, PA-C  ibuprofen (ADVIL,MOTRIN) 800 MG tablet Take 1 tablet (800 mg total) by mouth 3 (three) times daily. 11/15/15   Danelle Berry, PA-C  metroNIDAZOLE (FLAGYL) 500 MG tablet Take 1 tablet (500 mg total) by mouth 2 (two) times daily. 11/15/15 11/29/15  Danelle Berry, PA-C  norethindrone (ORTHO MICRONOR) 0.35 MG tablet Take 1 tablet (0.35 mg total) by mouth daily. Patient not taking: Reported on 11/15/2015 06/15/14   Willodean Rosenthal, MD  omeprazole (PRILOSEC) 20 MG capsule Take 1 capsule (20 mg total) by mouth daily. Patient not taking: Reported on 11/15/2015 02/09/15   Elpidio Anis, PA-C  ondansetron (ZOFRAN) 4 MG tablet Take 1 tablet (4 mg total) by mouth every 8 (eight) hours as needed for nausea or vomiting. 11/15/15   Danelle Berry, PA-C  polyethylene glycol (MIRALAX) packet Take 17 g by mouth daily. Patient not taking: Reported on 11/15/2015 02/09/15   Elpidio Anis, PA-C  sertraline (ZOLOFT) 50 MG tablet Take 1 tablet (50 mg total) by  mouth daily. Patient not taking: Reported on 11/15/2015 06/15/14   Willodean Rosenthal, MD    Family History Family History  Problem Relation Age of Onset  . Alcohol abuse Neg Hx   . Arthritis Neg Hx   . Asthma Neg Hx   . Birth defects Neg Hx   . Cancer Neg Hx   . COPD Neg Hx   . Depression Neg Hx   . Diabetes Neg Hx   . Drug abuse Neg Hx   . Early death Neg Hx   . Hearing loss Neg Hx   . Heart disease Neg Hx   . Hypertension Neg Hx   . Hyperlipidemia Neg Hx   . Kidney disease Neg Hx   . Learning disabilities Neg Hx   . Mental illness Neg Hx   . Mental retardation Neg Hx   . Miscarriages / Stillbirths Neg Hx   . Stroke Neg Hx   . Vision loss Neg Hx     Social History Social History  Substance Use Topics  . Smoking status: Never Smoker  . Smokeless tobacco: Never Used  . Alcohol use No     Allergies   Review of patient's allergies indicates no known allergies.   Review of Systems Review of Systems  Constitutional: Positive for fever.  Gastrointestinal: Positive for abdominal pain and nausea. Negative for  anorexia, constipation, diarrhea, flatus, hematochezia, melena and vomiting.  Genitourinary: Positive for dyspareunia, frequency, pelvic pain and vaginal bleeding. Negative for dysuria and hematuria.  Musculoskeletal: Negative for arthralgias and myalgias.  Neurological: Negative for headaches.  All other systems reviewed and are negative.    Physical Exam Updated Vital Signs BP (!) 102/48   Pulse 64   Temp 98 F (36.7 C) (Oral)   Resp 16   LMP 11/08/2015 (Within Days)   SpO2 98%   Physical Exam  Constitutional: She is oriented to person, place, and time. She appears well-developed and well-nourished. No distress.  Well appearing female, appears stated age, nontoxic in appearance, NAD  HENT:  Head: Normocephalic and atraumatic.  Nose: Nose normal.  Mouth/Throat: Oropharynx is clear and moist. No oropharyngeal exudate.  No pallor  Eyes:  Conjunctivae and EOM are normal. Pupils are equal, round, and reactive to light. Right eye exhibits no discharge. Left eye exhibits no discharge. No scleral icterus.  Neck: Normal range of motion. No JVD present. No tracheal deviation present.  Cardiovascular: Normal rate, regular rhythm, normal heart sounds and intact distal pulses.  Exam reveals no gallop and no friction rub.   No murmur heard. Pulmonary/Chest: Effort normal and breath sounds normal. No respiratory distress. She has no wheezes. She has no rales. She exhibits no tenderness.  Abdominal: Soft. Bowel sounds are normal. She exhibits no distension and no mass. There is tenderness. There is no rebound and no guarding.  Tenderness to palpation left lower quadrant suprapubic area with no guarding, no rebound tenderness.  No CVA tenderness.  Positive heel-tap test, negative Rovsing, negative psoas  Genitourinary: Vagina normal and uterus normal. Cervix exhibits motion tenderness. Right adnexum displays no mass, no tenderness and no fullness. Left adnexum displays tenderness. Left adnexum displays no mass and no fullness. No vaginal discharge found.  Genitourinary Comments: No inguinal lymphadenopathy bilaterally  Musculoskeletal: Normal range of motion. She exhibits no edema or tenderness.  Neurological: She is alert and oriented to person, place, and time. She displays normal reflexes. No cranial nerve deficit.  Skin: Skin is warm and dry. Capillary refill takes less than 2 seconds. No rash noted. She is not diaphoretic. No erythema. No pallor.  Psychiatric: She has a normal mood and affect. Her behavior is normal. Judgment and thought content normal.  Nursing note and vitals reviewed.    ED Treatments / Results  Labs (all labs ordered are listed, but only abnormal results are displayed) Labs Reviewed  WET PREP, GENITAL - Abnormal; Notable for the following:       Result Value   WBC, Wet Prep HPF POC MANY (*)    All other components  within normal limits  LIPASE, BLOOD  COMPREHENSIVE METABOLIC PANEL  CBC  URINALYSIS, ROUTINE W REFLEX MICROSCOPIC (NOT [facility])  POC URINE PREG, ED  GC/CHLAMYDIA PROBE AMP ([place]) NOT [facility]    EKG  EKG Interpretation None       Radiology [place] Transvaginal Non-ob  Result Date: 11/15/2015 CLINICAL DATA:  35 year old female with pelvic pain. EXAM: TRANSABDOMINAL AND TRANSVAGINAL ULTRASOUND OF PELVIS DOPPLER ULTRASOUND OF OVARIES TECHNIQUE: Both transabdominal and transvaginal ultrasound examinations of the pelvis were performed. Transabdominal technique was performed for global imaging of the pelvis including uterus, ovaries, adnexal regions, and pelvic cul-de-sac. It was necessary to proceed with endovaginal exam following the transabdominal exam to visualize the endometrium and ovaries. Color and duplex Doppler ultrasound was utilized to evaluate blood flow to the ovaries. COMPARISON:  CT of the abdomen pelvis dated 03/11/2013 and pelvic ultrasound dated 03/11/2013. FINDINGS: Uterus Measurements: 7.4 x 3.3 x 4.6 cm. No fibroids or other mass visualized. Endometrium Thickness: 3 mm.  No focal abnormality visualized. Right ovary Measurements: 2.6 x 2.6 x 3.4 cm. Normal appearance/no adnexal mass. Left ovary Measurements: 2.2 x 2.5 x 3.3 cm. Normal appearance/no adnexal mass. Pulsed Doppler evaluation of both ovaries demonstrates normal low-resistance arterial and venous waveforms. Other findings No abnormal free fluid. IMPRESSION: Unremarkable pelvic ultrasound. Electronically Signed   By: Elgie Collard M.D.   On: 11/15/2015 23:03   US Pelvis Complete  Result Date: 11/15/2015 CLINICAL DATA:  35 year old female with pelvic pain. EXAM: TRANSABDOMINAL AND TRANSVAGINAL ULTRASOUND OF PELVIS DOPPLER ULTRASOUND OF OVARIES TECHNIQUE: Both transabdominal and transvaginal ultrasound examinations of the pelvis were performed. Transabdominal technique was performed for global imaging of the pelvis  including uterus, ovaries, adnexal regions, and pelvic cul-de-sac. It was necessary to proceed with endovaginal exam following the transabdominal exam to visualize the endometrium and ovaries. Color and duplex Doppler ultrasound was utilized to evaluate blood flow to the ovaries. COMPARISON:  CT of the abdomen pelvis dated 03/11/2013 and pelvic ultrasound dated 03/11/2013. FINDINGS: Uterus Measurements: 7.4 x 3.3 x 4.6 cm. No fibroids or other mass visualized. Endometrium Thickness: 3 mm.  No focal abnormality visualized. Right ovary Measurements: 2.6 x 2.6 x 3.4 cm. Normal appearance/no adnexal mass. Left ovary Measurements: 2.2 x 2.5 x 3.3 cm. Normal appearance/no adnexal mass. Pulsed Doppler evaluation of both ovaries demonstrates normal low-resistance arterial and venous waveforms. Other findings No abnormal free fluid. IMPRESSION: Unremarkable pelvic ultrasound. Electronically Signed   By: Elgie Collard M.D.   On: 11/15/2015 23:03   [place] Art/ven Flow Abd Pelv Doppler  Result Date: 11/15/2015 CLINICAL DATA:  35 year old female with pelvic pain. EXAM: TRANSABDOMINAL AND TRANSVAGINAL ULTRASOUND OF PELVIS DOPPLER ULTRASOUND OF OVARIES TECHNIQUE: Both transabdominal and transvaginal ultrasound examinations of the pelvis were performed. Transabdominal technique was performed for global imaging of the pelvis including uterus, ovaries, adnexal regions, and pelvic cul-de-sac. It was necessary to proceed with endovaginal exam following the transabdominal exam to visualize the endometrium and ovaries. Color and duplex Doppler ultrasound was utilized to evaluate blood flow to the ovaries. COMPARISON:  CT of the abdomen pelvis dated 03/11/2013 and pelvic ultrasound dated 03/11/2013. FINDINGS: Uterus Measurements: 7.4 x 3.3 x 4.6 cm. No fibroids or other mass visualized. Endometrium Thickness: 3 mm.  No focal abnormality visualized. Right ovary Measurements: 2.6 x 2.6 x 3.4 cm. Normal appearance/no adnexal mass. Left  ovary Measurements: 2.2 x 2.5 x 3.3 cm. Normal appearance/no adnexal mass. Pulsed Doppler evaluation of both ovaries demonstrates normal low-resistance arterial and venous waveforms. Other findings No abnormal free fluid. IMPRESSION: Unremarkable pelvic ultrasound. Electronically Signed   By: Elgie Collard M.D.   On: 11/15/2015 23:03    Procedures Procedures (including critical care time)  Medications Ordered in ED Medications  ondansetron (ZOFRAN-ODT) disintegrating tablet 8 mg (8 mg Oral Given 11/15/15 2131)  oxyCODONE-acetaminophen (PERCOCET/ROXICET) 5-325 MG per tablet 1 tablet (1 tablet Oral Given 11/15/15 2131)  cefTRIAXone (ROCEPHIN) injection 250 mg (250 mg Intramuscular Given 11/16/15 0006)  doxycycline (VIBRA-TABS) tablet 100 mg (100 mg Oral Given 11/16/15 0007)  metroNIDAZOLE (FLAGYL) tablet 500 mg (500 mg Oral Given 11/16/15 0007)  lidocaine (PF) (XYLOCAINE) 1 % injection (2.1 mLs  Given 11/16/15 0007)     Initial Impression / Assessment and Plan / ED Course  I have reviewed the triage vital signs and  the nursing notes.  Pertinent labs & imaging results that were available during my care of the patient were reviewed by me and considered in my medical decision making (see chart for details).  Clinical Course  35 year old female complains of suprapubic and left lower quadrant abdominal pain gradually worsening over 2 months with new radiation to her left groin over the past 2 days, new metromenorrhagia. Low risk for STDs, monogamous with her husband for past 15 years.    Basic lab work resulted prior to the time of my evaluation, labs unremarkable, vital signs stable, systolic blood pressures high 16X to low 100s, vital signs reviewed in chart history she frequently has similar blood pressures. Orthostatics negative.  On exam she does have suprapubic left lower quadrant tenderness, without guarding or rebound.  She has no urinary complaints, no bowel complaints, we will get  pelvic ultrasound to evaluate her reproductive organs.  Her symptoms are most suspicious for uterine fibroids, cyst, endometriosis or other gynecological etiologies. Her lower suspicion for TOA and ovarian torsion.  Hemoglobin is 14, reassuring re near syncope and worse periods.  Pelvic ultrasound negative. Bimanual exam was significant for CMT and left adnexal tenderness, GC chlamydia and wet prep testing obtained, will cover for PID, discharge home with doxycycline, Flagyl, Zofran and pain medication. Encouraged follow-up with OB/GYN for abnormal periods. Encouraged follow-up with PCP as needed for recheck. Return precautions reviewed. Patient verbalized understanding.  Discharge home in good condition with stable vital signs.  Final Clinical Impressions(s) / ED Diagnoses   Final diagnoses:  Pelvic pain  PID (acute pelvic inflammatory disease)    New Prescriptions New Prescriptions   DOXYCYCLINE (VIBRAMYCIN) 100 MG CAPSULE    Take 1 capsule (100 mg total) by mouth 2 (two) times daily.   HYDROCODONE-ACETAMINOPHEN (NORCO/VICODIN) 5-325 MG TABLET    Take 1 tablet by mouth every 6 (six) hours as needed for severe pain.   IBUPROFEN (ADVIL,MOTRIN) 800 MG TABLET    Take 1 tablet (800 mg total) by mouth 3 (three) times daily.   METRONIDAZOLE (FLAGYL) 500 MG TABLET    Take 1 tablet (500 mg total) by mouth 2 (two) times daily.   ONDANSETRON (ZOFRAN) 4 MG TABLET    Take 1 tablet (4 mg total) by mouth every 8 (eight) hours as needed for nausea or vomiting.     Danelle Berry, PA-C 11/16/15 0036    Gerhard Munch, MD 11/20/15 [phone]

## 2015-11-15 NOTE — ED Notes (Signed)
Pt reports dizziness and nausia today.   Pain also with palpation of L upper groin and thigh and ROM of L eg

## 2015-11-15 NOTE — ED Triage Notes (Addendum)
Pt reports LLQ abd pain that radiates into her left leg. Pt reports some nausea. SHe reports this pain has been ongoing X2 months. Pt reports LMP was 1 week ago. Pt reports she has been having intermittent vaginal bleeding.

## 2015-11-16 LAB — WET PREP, GENITAL
Clue Cells Wet Prep HPF POC: NONE SEEN
SPERM: NONE SEEN
Trich, Wet Prep: NONE SEEN
Yeast Wet Prep HPF POC: NONE SEEN

## 2015-11-16 LAB — GC/CHLAMYDIA PROBE AMP (~~LOC~~) NOT AT ARMC
CHLAMYDIA, DNA PROBE: NEGATIVE
NEISSERIA GONORRHEA: NEGATIVE

## 2015-11-16 MED ORDER — LIDOCAINE HCL (PF) 1 % IJ SOLN
INTRAMUSCULAR | Status: AC
  Administered 2015-11-16: 2.1 mL
  Filled 2015-11-16: qty 5

## 2015-11-30 ENCOUNTER — Encounter (HOSPITAL_COMMUNITY): Payer: Self-pay | Admitting: Emergency Medicine

## 2015-11-30 ENCOUNTER — Emergency Department (HOSPITAL_COMMUNITY)
Admission: EM | Admit: 2015-11-30 | Discharge: 2015-11-30 | Disposition: A | Discharge status: Home / Self Care | Payer: Self-pay | Attending: Emergency Medicine | Admitting: Emergency Medicine

## 2015-11-30 DIAGNOSIS — N939 Abnormal uterine and vaginal bleeding, unspecified: Secondary | ICD-10-CM | POA: Insufficient documentation

## 2015-11-30 LAB — CBC
HEMATOCRIT: 40.8 % (ref 36.0–46.0)
HEMOGLOBIN: 13.6 g/dL (ref 12.0–15.0)
MCH: 28.9 pg (ref 26.0–34.0)
MCHC: 33.3 g/dL (ref 30.0–36.0)
MCV: 86.6 fL (ref 78.0–100.0)
Platelets: 197 10*3/uL (ref 150–400)
RBC: 4.71 MIL/uL (ref 3.87–5.11)
RDW: 12.8 % (ref 11.5–15.5)
WBC: 6.5 10*3/uL (ref 4.0–10.5)

## 2015-11-30 LAB — URINALYSIS, ROUTINE W REFLEX MICROSCOPIC
Bilirubin Urine: NEGATIVE
Glucose, UA: NEGATIVE mg/dL
Ketones, ur: NEGATIVE mg/dL
NITRITE: NEGATIVE
PH: 7.5 (ref 5.0–8.0)
Protein, ur: NEGATIVE mg/dL
SPECIFIC GRAVITY, URINE: 1.023 (ref 1.005–1.030)

## 2015-11-30 LAB — COMPREHENSIVE METABOLIC PANEL
ALBUMIN: 4.1 g/dL (ref 3.5–5.0)
ALT: 25 U/L (ref 14–54)
ANION GAP: 7 (ref 5–15)
AST: 28 U/L (ref 15–41)
Alkaline Phosphatase: 61 U/L (ref 38–126)
BILIRUBIN TOTAL: 0.7 mg/dL (ref 0.3–1.2)
BUN: 11 mg/dL (ref 6–20)
CO2: 23 mmol/L (ref 22–32)
Calcium: 8.8 mg/dL — ABNORMAL LOW (ref 8.9–10.3)
Chloride: 108 mmol/L (ref 101–111)
Creatinine, Ser: 0.83 mg/dL (ref 0.44–1.00)
GFR calc Af Amer: >60 mL/min (ref >60)
GFR calc non Af Amer: >60 mL/min (ref >60)
GLUCOSE: 76 mg/dL (ref 65–99)
POTASSIUM: 3.7 mmol/L (ref 3.5–5.1)
Sodium: 138 mmol/L (ref 135–145)
TOTAL PROTEIN: 6.8 g/dL (ref 6.5–8.1)

## 2015-11-30 LAB — URINE MICROSCOPIC-ADD ON

## 2015-11-30 LAB — WET PREP, GENITAL
CLUE CELLS WET PREP: NONE SEEN
Sperm: NONE SEEN
TRICH WET PREP: NONE SEEN
YEAST WET PREP: NONE SEEN

## 2015-11-30 LAB — I-STAT BETA HCG BLOOD, ED (MC, WL, AP ONLY): I-stat hCG, quantitative: <5 m[IU]/mL (ref <5)

## 2015-11-30 MED ORDER — NAPROXEN 500 MG PO TABS: 500.0000 mg | ORAL_TABLET | Freq: Two times a day (BID) | ORAL | 0 refills | Status: DC

## 2015-11-30 MED ORDER — NAPROXEN 375 MG PO TABS: 375.0000 mg | ORAL_TABLET | Freq: Two times a day (BID) | ORAL | 0 refills | Status: DC

## 2015-11-30 MED ORDER — SODIUM CHLORIDE 0.9 % IV SOLN
INTRAVENOUS | Status: AC
  Administered 2015-11-30: 19:00:00 via INTRAVENOUS

## 2015-11-30 MED ORDER — MEGESTROL ACETATE 40 MG PO TABS: ORAL_TABLET | ORAL | 0 refills | Status: DC

## 2015-11-30 MED ORDER — ACYCLOVIR 400 MG PO TABS: 400.0000 mg | ORAL_TABLET | Freq: Every day | ORAL | 0 refills | Status: DC

## 2015-11-30 NOTE — Discharge Instructions (Signed)
[facility] in [facility] ED and need a follow up appointment for your vaginal bleeding. We are starting you on medication to stop the bleeding but you will need follow up for further evaluation of the bleeding.

## 2015-11-30 NOTE — ED Provider Notes (Signed)
[facility]-EMERGENCY DEPT Provider Note   CSN: [id number] Arrival date & time: 11/30/15  1700  By signing my name below, I, Rhonda Townsend, attest that this documentation has been prepared under the direction and in the presence of [facility], [place].  Electronically Signed: Rosario Townsend, ED Scribe. 11/30/15. 7:10 PM.  History   Chief Complaint Chief Complaint  Patient presents with  . Abdominal Pain  . Vaginal Bleeding   The history is provided by the patient. No language interpreter was used.  Abdominal Pain   This is a recurrent problem. The current episode started more than 1 week ago. The problem occurs constantly. The problem has been gradually worsening. The pain is associated with an unknown factor. The pain is located in the RLQ, LLQ and suprapubic region. The pain is at a severity of 8/10. The pain is moderate. Associated symptoms include nausea, vomiting, dysuria and headaches. Pertinent negatives include fever. Nothing aggravates the symptoms. Nothing relieves the symptoms. Past workup includes ultrasound.    HPI Comments: Rhonda Townsend is a 35 y.o. female who presents to the Emergency Department complaining of gradually worsening lower abdominal pain w/ associated vaginal bleeding onset approximately 3 months ago. Pt notes that her pain radiates into her lower back. She states that she has been using 6-7 pads a day since the onset of her symptoms, and her bleeding is stronger than her period. No passage of clots or tissue. She additionally reports associated, light-headedness, dysuria, dyspareunia, headache, nausea, and one episode of vomiting this morning secondary to her abdominal pain and vaginal bleeding. Pt was seen for same approximately 1 month ago in the ED, and at that time she had a pelvic US which was unremarkable. She was treated for PID. She took one dose of Tylenol yesterday with minimal relief of her symptoms. She is not current followed by a OBGYN.  Pt has been with her current sexual partner for 15 years, and denies any other partners. Denies fever, chills, cough, congestion, rhinorrhea, or any other associated symptoms.   Past Medical History:  Diagnosis Date  . Blood transfusion without reported diagnosis    pp hemorrhage  . Medical history non-contributory   . Tachycardia 2014   Patient Active Problem List   Diagnosis Date Noted  . Amniotic fluid leaking 05/07/2014  . Language barrier, speaks Spanish only 03/11/2014  . Traumatic injury during pregnancy, antepartum   . Postpartum depression, postpartum condition 07/17/2012  . Supervision of other normal pregnancy 06/12/2012  . Previous cesarean delivery, antepartum condition or complication 02/28/2012   Past Surgical History:  Procedure Laterality Date  . CESAREAN SECTION     2003 (1st preg in [place])   [place] History    Gravida Para Term Preterm AB Living   6 6 5 1  0 6   SAB TAB Ectopic Multiple Live Births   0 0 0 0 6       Home Medications    Prior to Admission medications   Medication Sig Start Date End Date Taking? Authorizing Provider  megestrol (MEGACE) 40 MG tablet Take two tablets (40 mg) three times per day time three days,  then take two tablets (40 mg) two times per day time three days,  then take two tablets (40 mg)once per day 11/30/15   Janne Napoleon, NP  naproxen (NAPROSYN) 375 MG tablet Take 1 tablet (375 mg total) by mouth 2 (two) times daily. 11/30/15   Yara Tomkinson Orlene Och, NP  ondansetron (ZOFRAN) 4 MG tablet  Take 1 tablet (4 mg total) by mouth every 8 (eight) hours as needed for nausea or vomiting. Patient not taking: Reported on 11/30/2015 11/15/15   Danelle BerryLeisa Tapia, PA-C  polyethylene glycol [facility](MIRALAX) packet Take 17 g by mouth daily. Patient not taking: Reported on 11/30/2015 02/09/15   Elpidio AnisShari Upstill, PA-C   Family History Family History  Problem Relation Age of Onset  . Alcohol abuse Neg Hx   . Arthritis Neg Hx   . Asthma Neg Hx   . Birth defects Neg Hx     . Cancer Neg Hx   . COPD Neg Hx   . Depression Neg Hx   . Diabetes Neg Hx   . Drug abuse Neg Hx   . Early death Neg Hx   . Hearing loss Neg Hx   . Heart disease Neg Hx   . Hypertension Neg Hx   . Hyperlipidemia Neg Hx   . Kidney disease Neg Hx   . Learning disabilities Neg Hx   . Mental illness Neg Hx   . Mental retardation Neg Hx   . Miscarriages / Stillbirths Neg Hx   . Stroke Neg Hx   . Vision loss Neg Hx    Social History Social History  Substance Use Topics  . Smoking status: Never Smoker  . Smokeless tobacco: Never Used  . Alcohol use No   Allergies   Patient has no known allergies.  Review of Systems Review of Systems  Constitutional: Negative for chills and fever.  HENT: Negative for congestion and rhinorrhea.   Respiratory: Negative for cough.   Gastrointestinal: Positive for abdominal pain, nausea and vomiting.  Genitourinary: Positive for dyspareunia, dysuria and vaginal bleeding.  Musculoskeletal: Positive for back pain (lower).  Neurological: Positive for light-headedness and headaches.  Psychiatric/Behavioral: Negative for confusion. The patient is not nervous/anxious.   All other systems reviewed and are negative.  Physical Exam Updated Vital Signs BP 100/58 (BP Location: Left Arm)   Pulse 61   Temp 98.1 F (36.7 C) (Oral)   Resp 18   LMP 11/08/2015 (Within Days)   SpO2 100%   Physical Exam  Constitutional: She appears well-developed and well-nourished. No distress.  HENT:  Head: Normocephalic and atraumatic.  Eyes: Conjunctivae are normal.  Neck: Neck supple.  Cardiovascular: Normal rate.   Pulmonary/Chest: Effort normal. No respiratory distress.  Abdominal: She exhibits no distension. There is no tenderness.  Genitourinary:  Genitourinary Comments: External genitalia without lesions, small blood vaginal vault, mild CMT, no adnexal mass palpated, uterus without palpable enlargement.   Musculoskeletal: Normal range of motion.   Neurological: She is alert.  Skin: Skin is warm and dry.  Psychiatric: She has a normal mood and affect. Her behavior is normal.  Nursing note and vitals reviewed.  ED Treatments / Results  DIAGNOSTIC STUDIES: Oxygen Saturation is 100% on RA, normal by my interpretation.   COORDINATION OF CARE: 6:38 PM-Discussed next steps with pt. Pt verbalized understanding and is agreeable with the plan.   Labs (all labs ordered are listed, but only abnormal results are displayed) Labs Reviewed  WET PREP, GENITAL - Abnormal; Notable for the following:       Result Value   WBC, Wet Prep HPF POC MODERATE (*)    All other components within normal limits  COMPREHENSIVE METABOLIC PANEL - Abnormal; Notable for the following:    Calcium 8.8 (*)    All other components within normal limits  URINALYSIS, ROUTINE W REFLEX MICROSCOPIC (NOT AT [facility]) - Abnormal; Notable  for the following:    Color, Urine RED (*)    APPearance CLOUDY (*)    Hgb urine dipstick LARGE (*)    Leukocytes, UA SMALL (*)    All other components within normal limits  URINE MICROSCOPIC-ADD ON - Abnormal; Notable for the following:    Squamous Epithelial / LPF 0-5 (*)    Bacteria, UA RARE (*)    All other components within normal limits  CBC  I-STAT BETA HCG BLOOD, ED ([facility], [facility], [facility] ONLY)  GC/CHLAMYDIA PROBE AMP ([place]) NOT AT [facility]    Radiology No results found.  Procedures Procedures   Medications Ordered in ED Medications  0.9 %  sodium chloride infusion ( Intravenous Stopped 11/30/15 2018)    Initial Impression / Assessment and Plan / ED Course  I have reviewed the triage vital signs and the nursing notes.  Pertinent labs & imaging results that were available during my care of the patient were reviewed by me and considered in my medical decision making (see chart for details).  Clinical Course   35 y.o. female with vaginal bleeding stable for d/c without being anemic and is not orthostatic. Discussed with the  patient need [facility]. Will start Megace for the bleeding. Patient will call for f/u appointment.   Final Clinical Impressions(s) / ED Diagnoses   Final diagnoses:  Abnormal vaginal bleeding   New Prescriptions New Prescriptions   MEGESTROL (MEGACE) 40 MG TABLET    Take two tablets (40 mg) three times per day time three days,  then take two tablets (40 mg) two times per day time three days,  then take two tablets (40 mg)once per day   NAPROXEN (NAPROSYN) 375 MG TABLET    Take 1 tablet (375 mg total) by mouth 2 (two) times daily.   I personally performed the services described in this documentation, which was scribed in my presence. The recorded information has been reviewed and is accurate.     [place] Bend[place] M Kashia Brossard, [place] 11/30/15 2039    Rolland PorterMark James, MD 12/21/15 [phone]

## 2015-11-30 NOTE — ED Triage Notes (Signed)
Pt sts lower abd pain with vaginal bleeding x 2 weeks

## 2015-12-01 LAB — GC/CHLAMYDIA PROBE AMP (~~LOC~~) NOT AT ARMC
CHLAMYDIA, DNA PROBE: NEGATIVE
NEISSERIA GONORRHEA: NEGATIVE

## 2015-12-03 ENCOUNTER — Encounter (HOSPITAL_COMMUNITY): Payer: Self-pay | Admitting: Emergency Medicine

## 2015-12-10 ENCOUNTER — Inpatient Hospital Stay: Payer: Self-pay

## 2015-12-31 ENCOUNTER — Encounter: Payer: Self-pay | Admitting: Physician Assistant

## 2015-12-31 ENCOUNTER — Ambulatory Visit: Payer: Self-pay | Attending: Family Medicine | Admitting: Physician Assistant

## 2015-12-31 VITALS — BP 104/67 | HR 62 | Temp 98.8°F | Resp 16 | Wt 143.8 lb

## 2015-12-31 DIAGNOSIS — N938 Other specified abnormal uterine and vaginal bleeding: Secondary | ICD-10-CM | POA: Insufficient documentation

## 2015-12-31 DIAGNOSIS — M62838 Other muscle spasm: Secondary | ICD-10-CM | POA: Insufficient documentation

## 2015-12-31 DIAGNOSIS — R112 Nausea with vomiting, unspecified: Secondary | ICD-10-CM | POA: Insufficient documentation

## 2015-12-31 DIAGNOSIS — M5442 Lumbago with sciatica, left side: Secondary | ICD-10-CM | POA: Insufficient documentation

## 2015-12-31 LAB — POCT URINE PREGNANCY: PREG TEST UR: NEGATIVE

## 2015-12-31 MED ORDER — MELOXICAM 15 MG PO TABS: 15.0000 mg | ORAL_TABLET | Freq: Every day | ORAL | 0 refills | Status: DC

## 2015-12-31 MED ORDER — METHOCARBAMOL 500 MG PO TABS: 500.0000 mg | ORAL_TABLET | Freq: Four times a day (QID) | ORAL | 0 refills | Status: DC

## 2015-12-31 NOTE — Progress Notes (Signed)
Rhonda Townsend, is a 35 y.o. female  ZOX:[id number]  [id number]:[id number]  DOB - 1980-02-07  Subjective:  Chief Complaint and HPI: Rhonda Townsend is a 35 y.o. female here today to establish care and for a follow up visit after being seen and treated in the ED for dysfunctional uterine bleeding of 3 months duration on 11/30/2015.  The bleeding stopped after being put on megace. She had never had abnormal bleeding like this.   All STD testing has been negative.  She is monogamous with her husband.  They are using condoms for contraception. She has been having nausea with some vomiting the last few weeks. She also c/o several month h/o Left leg pain and weakness with some LBP.  She denies any injury. Ibuprofen/tylenol give minimal relief. She has also had some headaches without vision changes.  She has not seen a gynecologist in a long time.  [employer] used.  ED/Hospital notes reviewed.    ROS:   Constitutional:  No f/c, No night sweats, No unexplained weight loss. EENT:  No vision changes, No blurry vision, No hearing changes. No mouth, throat, or ear problems.  Respiratory: No cough, No SOB Cardiac: No CP, no palpitations GI:  + intermittent abd pain, No D/C.  Some occasional N/V GU: No Urinary s/sx.  No vaginal d/c Musculoskeletal: + l leg pain and LBP Neuro: + headache, no dizziness, + motor weakness of L leg.  Skin: No rash Endocrine:  No polydipsia. No polyuria.  Psych: Denies SI/HI  No problems updated.  ALLERGIES: No Known Allergies  PAST MEDICAL HISTORY: Past Medical History:  Diagnosis Date  . Blood transfusion without reported diagnosis    pp hemorrhage  . Medical history non-contributory   . Tachycardia 2014    MEDICATIONS AT HOME: Prior to Admission medications   Medication Sig Start Date End Date Taking? Authorizing Provider  meloxicam (MOBIC) 15 MG tablet Take 1 tablet (15 mg total) by mouth daily. X 10 days then prn pain 12/31/15   Anders SimmondsAngela M McClung,  PA-C  methocarbamol (ROBAXIN) 500 MG tablet Take 1 tablet (500 mg total) by mouth 4 (four) times daily. X 10 days then prn muscle spasm 12/31/15   Marzella SchleinAngela M McClung, PA-C  ondansetron (ZOFRAN) 4 MG tablet Take 1 tablet (4 mg total) by mouth every 8 (eight) hours as needed for nausea or vomiting. Patient not taking: Reported on 12/31/2015 11/15/15   Danelle BerryLeisa Tapia, PA-C     Objective:  EXAM:   Vitals:   12/31/15 1653  BP: 104/67  Pulse: 62  Resp: 16  Temp: 98.8 F (37.1 C)  TempSrc: Oral  SpO2: 100%  Weight: 143 lb 12.8 oz (65.2 kg)    General appearance : A&OX3. NAD. Non-toxic-appearing HEENT: Atraumatic and Normocephalic.  PERRLA. EOM intact.  TM clear B. Mouth-MMM, post pharynx WNL w/o erythema, No PND. Neck: supple, no JVD. No cervical lymphadenopathy. No thyromegaly Chest/Lungs:  Breathing-non-labored, Good air entry bilaterally, breath sounds normal without rales, rhonchi, or wheezing  CVS: S1 S2 regular, no murmurs, gallops, rubs  Abdomen: Bowel sounds present, Non tender and not distended with no gaurding, rigidity or rebound. Extremities: Bilateral Lower Ext shows no edema, both legs are warm to touch with = pulse throughout Neurology:  CN II-XII grossly intact, Non focal.  Her LLE is weaker than the R.  Her reflexes are =B.  No babinski Psych:  TP linear. J/I WNL. Normal speech. Appropriate eye contact and affect.  Skin:  No Rash  Data Review  No results found for: HGBA1C   Assessment & Plan   1. Nausea and vomiting, intractability of vomiting not specified, unspecified vomiting type - H. pylori breath test - POCT urine pregnancy All labs done in the last few months reviewed and WNL.  2. Left-sided low back pain with left-sided sciatica, unspecified chronicity with L leg weakness and pain -consider further work-up - DG Lumbar Spine 2-3 Views; Future - meloxicam (MOBIC) 15 MG tablet; Take 1 tablet (15 mg total) by mouth daily. X 10 days then prn pain  Dispense: 30  tablet; Refill: 0  3. Dysfunctional uterine bleeding - Ambulatory referral to Gynecology - POCT urine pregnancy  4. Muscle spasm - methocarbamol (ROBAXIN) 500 MG tablet; Take 1 tablet (500 mg total) by mouth 4 (four) times daily. X 10 days then prn muscle spasm  Dispense: 90 tablet; Refill: 0   Patient have been counseled extensively about nutrition and exercise  F/up 2 weeks to establish with PCP and f/up leg weakness  The patient was given clear instructions to go to ER or return to medical center if symptoms don't improve, worsen or new problems develop. The patient verbalized understanding. The patient was told to call to get lab results if they haven't heard anything in the next week.     [place]ian CoAngela McClung, PA-C [facility] [place], [place] [phone]   12/31/2015, 5:17 PMPatient ID: Rhonda KocherElizabeth Townsend, female   DOB: 1980/09/14, 35 y.o.   MRN: [id number]

## 2016-01-04 ENCOUNTER — Other Ambulatory Visit: Payer: Self-pay | Admitting: Physician Assistant

## 2016-01-04 LAB — H. PYLORI BREATH TEST: H. pylori Breath Test: DETECTED — AB

## 2016-01-06 ENCOUNTER — Telehealth: Payer: Self-pay

## 2016-01-06 ENCOUNTER — Other Ambulatory Visit: Payer: Self-pay | Admitting: Physician Assistant

## 2016-01-06 DIAGNOSIS — K2970 Gastritis, unspecified, without bleeding: Principal | ICD-10-CM

## 2016-01-06 DIAGNOSIS — B9681 Helicobacter pylori [H. pylori] as the cause of diseases classified elsewhere: Secondary | ICD-10-CM

## 2016-01-06 MED ORDER — OMEPRAZOLE 20 MG PO CPDR: 20.0000 mg | DELAYED_RELEASE_CAPSULE | Freq: Two times a day (BID) | ORAL | 0 refills | Status: DC

## 2016-01-06 MED ORDER — CLARITHROMYCIN 500 MG PO TABS: ORAL_TABLET | ORAL | 0 refills | Status: DC

## 2016-01-06 MED ORDER — AMOXICILLIN 500 MG PO CAPS
500.0000 mg | ORAL_CAPSULE | Freq: Three times a day (TID) | ORAL | 0 refills | Status: DC
Start: 2016-01-06 — End: 2016-03-03

## 2016-01-06 NOTE — Telephone Encounter (Signed)
[employer] [place] ID: [id number] contacted pt to go over lab results pt is aware of lab results and doesn't have any questions or concerns

## 2016-03-03 ENCOUNTER — Ambulatory Visit (INDEPENDENT_AMBULATORY_CARE_PROVIDER_SITE_OTHER): Payer: Self-pay | Admitting: Family Medicine

## 2016-03-03 ENCOUNTER — Other Ambulatory Visit (HOSPITAL_COMMUNITY)
Admission: RE | Admit: 2016-03-03 | Discharge: 2016-03-03 | Disposition: A | Discharge status: Home / Self Care | Payer: Self-pay | Source: Ambulatory Visit | Attending: Family Medicine | Admitting: Family Medicine

## 2016-03-03 ENCOUNTER — Encounter: Payer: Self-pay | Admitting: Family Medicine

## 2016-03-03 VITALS — BP 104/62 | HR 63 | Wt 145.6 lb

## 2016-03-03 DIAGNOSIS — N939 Abnormal uterine and vaginal bleeding, unspecified: Secondary | ICD-10-CM

## 2016-03-03 LAB — TSH: TSH: 0.72 m[IU]/L

## 2016-03-03 LAB — POCT PREGNANCY, URINE: PREG TEST UR: NEGATIVE

## 2016-03-03 MED ORDER — MEGESTROL ACETATE 40 MG PO TABS: 40.0000 mg | ORAL_TABLET | Freq: Two times a day (BID) | ORAL | 3 refills | Status: DC

## 2016-03-03 NOTE — Progress Notes (Signed)
Subjective:    Patient ID: Rhonda Townsend, female    DOB: 11-Jan-1981, 36 y.o.   MRN: [id number]  HPI 36 year old seen for abnormal uterine bleeding. This started approximately 9-10 months ago: She would have bleeding for several days in row then 2-3 days without bleeding before the leading started again. Describes the bleeding as very heavy changing saturated pads every 2-3 hours. Was put on Megace for approximately 3 months, during which she had no bleeding. Bleeding returned when she stopped the Megace approximately 1 month ago: A few days of bleeding, to 3 days off, and return to bleeding.  For evaluation, the patient had 2 ultrasounds: 1 on 07/30/2015 and the second in October 2017. There were no fibroids seen. Endometrium was 6 and 3 mm respectively, and without focal abnormality. Ovaries normal in appearance.  Previous to this, her menstrual cycle is regular: Every 28-30 days with approximate 4 days of moderate bleeding.  I have reviewed the patients past medical, family, and social history.  I have reviewed the patient's medication list and allergies.    Review of Systems     Objective:   Physical Exam  Constitutional: She is oriented to person, place, and time. She appears well-developed and well-nourished.  Neck: Normal range of motion. Neck supple. No tracheal deviation present. No thyromegaly present.  Cardiovascular: Normal rate, regular rhythm and normal heart sounds.   Pulmonary/Chest: Effort normal and breath sounds normal.  Abdominal: Soft. There is no tenderness. There is no rebound and no guarding.  Genitourinary: There is no rash, tenderness, lesion or injury on the right labia. There is no rash, tenderness, lesion or injury on the left labia. Cervix exhibits no motion tenderness, no discharge and no friability. No erythema, tenderness or bleeding in the vagina. No foreign body in the vagina. No signs of injury around the vagina. No vaginal discharge found.    Neurological: She is alert and oriented to person, place, and time.  Skin: Skin is warm and dry.  Psychiatric: She has a normal mood and affect. Her behavior is normal. Judgment and thought content normal.       Assessment & Plan:  1. Abnormal uterine bleeding (AUB) While the patient is at low risk for endometrial cancer (normal BMI), the return of her KUB despite 3 months of being on the Megace is a little concerning. Will obtain a TSH, hemoglobin A1c, and do an endometrial biopsy (procedure note below). Will start the patient back on Megace 40 mg twice a day and reevaluate in 3 months. Patient may need to be on this for 6 months before a trial off the medication. - TSH - Hemoglobin A1c - Surgical pathology   ENDOMETRIAL BIOPSY     The indications for endometrial biopsy were reviewed.   Risks of the biopsy including cramping, bleeding, infection, uterine perforation, inadequate specimen and need for additional procedures  were discussed. The patient states she understands and agrees to undergo procedure today. Consent was signed. Time out was performed. Urine HCG was negative. A sterile speculum was placed in the patient's vagina and the cervix was prepped with Betadine. A single-toothed tenaculum was placed on the anterior lip of the cervix to stabilize it. The 3 mm pipelle was introduced into the endometrial cavity without difficulty to a depth of 8 cm, and a moderate amount of tissue was obtained and sent to pathology. The instruments were removed from the patient's vagina. Minimal bleeding from the cervix was noted. The patient tolerated the procedure well.  Routine post-procedure instructions were given to the patient. The patient will follow up to review the results and for further management.

## 2016-03-04 LAB — HEMOGLOBIN A1C
Hgb A1c MFr Bld: 5.1 % (ref <5.7)
Mean Plasma Glucose: 100 mg/dL

## 2016-03-07 ENCOUNTER — Telehealth: Payer: Self-pay

## 2016-03-07 NOTE — Telephone Encounter (Signed)
-----   Message from Levie HeritageJacob J Stinson, DO sent at 03/07/2016  2:09 PM EST ----- Endometrial biopsy and lab results are normal. Continue megace as I had prescribed.

## 2016-03-07 NOTE — Telephone Encounter (Signed)
Patient notified of lab results and need to continued medication she has been prescribed.

## 2016-08-14 ENCOUNTER — Emergency Department (HOSPITAL_COMMUNITY)
Admission: EM | Admit: 2016-08-14 | Discharge: 2016-08-14 | Disposition: A | Discharge status: Home / Self Care | Payer: Self-pay | Attending: Emergency Medicine | Admitting: Emergency Medicine

## 2016-08-14 ENCOUNTER — Encounter (HOSPITAL_COMMUNITY): Payer: Self-pay | Admitting: Emergency Medicine

## 2016-08-14 DIAGNOSIS — R1013 Epigastric pain: Secondary | ICD-10-CM | POA: Insufficient documentation

## 2016-08-14 DIAGNOSIS — R112 Nausea with vomiting, unspecified: Secondary | ICD-10-CM | POA: Insufficient documentation

## 2016-08-14 DIAGNOSIS — R51 Headache: Secondary | ICD-10-CM | POA: Insufficient documentation

## 2016-08-14 DIAGNOSIS — Z3201 Encounter for pregnancy test, result positive: Secondary | ICD-10-CM | POA: Insufficient documentation

## 2016-08-14 DIAGNOSIS — Z3490 Encounter for supervision of normal pregnancy, unspecified, unspecified trimester: Secondary | ICD-10-CM

## 2016-08-14 LAB — LIPASE, BLOOD: Lipase: 27 U/L (ref 11–51)

## 2016-08-14 LAB — URINALYSIS, ROUTINE W REFLEX MICROSCOPIC
BILIRUBIN URINE: NEGATIVE
Glucose, UA: NEGATIVE mg/dL
Ketones, ur: NEGATIVE mg/dL
LEUKOCYTES UA: NEGATIVE
NITRITE: NEGATIVE
PROTEIN: NEGATIVE mg/dL
SPECIFIC GRAVITY, URINE: 1.011 (ref 1.005–1.030)
pH: 8 (ref 5.0–8.0)

## 2016-08-14 LAB — CBC
HCT: 38.9 % (ref 36.0–46.0)
HEMOGLOBIN: 13 g/dL (ref 12.0–15.0)
MCH: 28.6 pg (ref 26.0–34.0)
MCHC: 33.4 g/dL (ref 30.0–36.0)
MCV: 85.7 fL (ref 78.0–100.0)
Platelets: 196 10*3/uL (ref 150–400)
RBC: 4.54 MIL/uL (ref 3.87–5.11)
RDW: 13.6 % (ref 11.5–15.5)
WBC: 8.7 10*3/uL (ref 4.0–10.5)

## 2016-08-14 LAB — COMPREHENSIVE METABOLIC PANEL
ALT: 12 U/L — ABNORMAL LOW (ref 14–54)
ANION GAP: 8 (ref 5–15)
AST: 24 U/L (ref 15–41)
Albumin: 4.2 g/dL (ref 3.5–5.0)
Alkaline Phosphatase: 51 U/L (ref 38–126)
BILIRUBIN TOTAL: 0.7 mg/dL (ref 0.3–1.2)
BUN: 7 mg/dL (ref 6–20)
CO2: 21 mmol/L — ABNORMAL LOW (ref 22–32)
Calcium: 8.8 mg/dL — ABNORMAL LOW (ref 8.9–10.3)
Chloride: 106 mmol/L (ref 101–111)
Creatinine, Ser: 0.75 mg/dL (ref 0.44–1.00)
GFR calc Af Amer: >60 mL/min (ref >60)
Glucose, Bld: 115 mg/dL — ABNORMAL HIGH (ref 65–99)
POTASSIUM: 4.1 mmol/L (ref 3.5–5.1)
Sodium: 135 mmol/L (ref 135–145)
TOTAL PROTEIN: 6.5 g/dL (ref 6.5–8.1)

## 2016-08-14 LAB — POC URINE PREG, ED: Preg Test, Ur: POSITIVE — AB

## 2016-08-14 MED ORDER — ONDANSETRON 4 MG PO TBDP
4.0000 mg | ORAL_TABLET | Freq: Once | ORAL | Status: AC
  Administered 2016-08-14: 4 mg via ORAL
  Filled 2016-08-14: qty 1

## 2016-08-14 MED ORDER — ONDANSETRON 4 MG PO TBDP: 4.0000 mg | ORAL_TABLET | Freq: Three times a day (TID) | ORAL | 0 refills | Status: DC | PRN

## 2016-08-14 NOTE — ED Triage Notes (Signed)
Pt. Stated, Ive had stomach pain with nausea,and a headache.  My period this month last week was a little bit.

## 2016-08-14 NOTE — ED Notes (Signed)
No vomiting

## 2016-08-14 NOTE — ED Notes (Signed)
The pt is c/o mid abd pain for 2 weeks  Her lmp was 7 days ago  But it was light and lasted  Less time than was her usual flow

## 2016-08-14 NOTE — ED Provider Notes (Signed)
[facility] DEPT Provider Note   CSN: [id number] Arrival date & time: 08/14/16  1807     History   Chief Complaint Chief Complaint  Patient presents with  . Abdominal Pain  . Emesis  . Headache  . Nausea    HPI Rhonda Townsend is a 36 y.o. female.  Patient presents with complaint of nausea and vomiting x 2 weeks. No fever. She has epigastric discomfort. No other abdominal pain. No hematemesis. She has not taken anything for symptoms. No ill family members. She denies urinary symptoms, vaginal bleeding, vaginal discharge or back pain.   The history is provided by the patient. No language interpreter was used.  Abdominal Pain   Associated symptoms include nausea, vomiting and headaches. Pertinent negatives include fever and dysuria.  Emesis   Associated symptoms include abdominal pain (Epigastric pain only) and headaches. Pertinent negatives include no chills and no fever.  Headache   Associated symptoms include nausea and vomiting. Pertinent negatives include no fever and no shortness of breath.    Past Medical History:  Diagnosis Date  . Blood transfusion without reported diagnosis    pp hemorrhage  . Medical history non-contributory   . Tachycardia 2014    Patient Active Problem List   Diagnosis Date Noted  . Amniotic fluid leaking 05/07/2014  . Language barrier, speaks Spanish only 03/11/2014  . Traumatic injury during pregnancy, antepartum   . Postpartum depression, postpartum condition 07/17/2012  . Supervision of other normal pregnancy 06/12/2012  . Previous cesarean delivery, antepartum condition or complication 02/28/2012    Past Surgical History:  Procedure Laterality Date  . CESAREAN SECTION    . CESAREAN SECTION     2003 (1st preg in [place])    [place] History    Gravida Para Term Preterm AB Living   6 6 5 1  0 6   SAB TAB Ectopic Multiple Live Births   0 0 0   6       Home Medications    Prior to Admission medications   Not on File     Family History Family History  Problem Relation Age of Onset  . Alcohol abuse Neg Hx   . Arthritis Neg Hx   . Asthma Neg Hx   . Birth defects Neg Hx   . Cancer Neg Hx   . COPD Neg Hx   . Depression Neg Hx   . Diabetes Neg Hx   . Drug abuse Neg Hx   . Early death Neg Hx   . Hearing loss Neg Hx   . Heart disease Neg Hx   . Hypertension Neg Hx   . Hyperlipidemia Neg Hx   . Kidney disease Neg Hx   . Learning disabilities Neg Hx   . Mental illness Neg Hx   . Mental retardation Neg Hx   . Miscarriages / Stillbirths Neg Hx   . Stroke Neg Hx   . Vision loss Neg Hx     Social History Social History  Substance Use Topics  . Smoking status: Never Smoker  . Smokeless tobacco: Never Used  . Alcohol use No     Allergies   Patient has no known allergies.   Review of Systems Review of Systems  Constitutional: Negative for chills and fever.  Respiratory: Negative for shortness of breath.   Cardiovascular: Negative for chest pain.  Gastrointestinal: Positive for abdominal pain (Epigastric pain only), nausea and vomiting.  Genitourinary: Negative.  Negative for dysuria, vaginal bleeding and vaginal discharge.  Musculoskeletal: Negative  for back pain.  Neurological: Positive for headaches.     Physical Exam Updated Vital Signs BP (!) 99/59   Pulse (!) 53   Temp 98.9 F (37.2 C) (Oral)   Resp 18   Wt 59 kg (130 lb)   LMP 08/07/2016   SpO2 100%   BMI 24.56 kg/m   Physical Exam  Constitutional: She is oriented to person, place, and time. She appears well-developed and well-nourished. No distress.  HENT:  Mouth/Throat: Oropharynx is clear and moist.  Neck: Normal range of motion.  Pulmonary/Chest: Effort normal. She has no wheezes. She has no rales.  Abdominal: Soft. There is tenderness (Limited to epigastrium.).  Musculoskeletal: Normal range of motion.  Neurological: She is alert and oriented to person, place, and time.  Skin: Skin is warm and dry.     ED  Treatments / Results  Labs (all labs ordered are listed, but only abnormal results are displayed) Labs Reviewed  COMPREHENSIVE METABOLIC PANEL - Abnormal; Notable for the following:       Result Value   CO2 21 (*)    Glucose, Bld 115 (*)    Calcium 8.8 (*)    ALT 12 (*)    All other components within normal limits  URINALYSIS, ROUTINE W REFLEX MICROSCOPIC - Abnormal; Notable for the following:    APPearance HAZY (*)    Hgb urine dipstick SMALL (*)    Bacteria, UA RARE (*)    Squamous Epithelial / LPF 0-5 (*)    All other components within normal limits  POC URINE PREG, ED - Abnormal; Notable for the following:    Preg Test, Ur POSITIVE (*)    All other components within normal limits  LIPASE, BLOOD  CBC    EKG  EKG Interpretation None       Radiology No results found.  Procedures Procedures (including critical care time)  Medications Ordered in ED Medications  ondansetron (ZOFRAN-ODT) disintegrating tablet 4 mg (not administered)     Initial Impression / Assessment and Plan / ED Course  I have reviewed the triage vital signs and the nursing notes.  Pertinent labs & imaging results that were available during my care of the patient were reviewed by me and considered in my medical decision making (see chart for details).     Patient presents with nausea and vomiting, without fever. She is found to be pregnant by urine pregnancy test. She denies breast tenderness. No vaginal symptoms. She reports she had vaginal bleeding last week but it was lighter and shorter than a normal period and was earlier than expected.  Nausea and vomiting likely related to the pregnancy as there are no other symptoms - no fever, sick contacts. No vomiting in the ED. No symptoms to cause concern for miscarriage - no lower abdominal pain, bleeding, sign of infection.   Will discharge home with Zofran. [facility]   Final Clinical Impressions(s) / ED  Diagnoses   Final diagnoses:  None   1. Nausea and vomiting 2. Pregnant  New Prescriptions New Prescriptions   No medications on file     Danne HarborUpstill, Delona Clasby, PA-C 08/14/16 2232    Loren RacerYelverton, David, MD 08/17/16 2234

## 2016-09-28 ENCOUNTER — Emergency Department (HOSPITAL_COMMUNITY): Payer: Self-pay

## 2016-09-28 ENCOUNTER — Emergency Department (HOSPITAL_COMMUNITY)
Admission: EM | Admit: 2016-09-28 | Discharge: 2016-09-28 | Disposition: A | Discharge status: Home / Self Care | Payer: Self-pay | Attending: Emergency Medicine | Admitting: Emergency Medicine

## 2016-09-28 ENCOUNTER — Encounter (HOSPITAL_COMMUNITY): Payer: Self-pay | Admitting: *Deleted

## 2016-09-28 DIAGNOSIS — R1030 Lower abdominal pain, unspecified: Secondary | ICD-10-CM | POA: Insufficient documentation

## 2016-09-28 DIAGNOSIS — Z3A13 13 weeks gestation of pregnancy: Secondary | ICD-10-CM | POA: Insufficient documentation

## 2016-09-28 DIAGNOSIS — O039 Complete or unspecified spontaneous abortion without complication: Secondary | ICD-10-CM | POA: Insufficient documentation

## 2016-09-28 LAB — CBC WITH DIFFERENTIAL/PLATELET
Basophils Absolute: 0 10*3/uL (ref 0.0–0.1)
Basophils Relative: 0 %
EOS PCT: 2 %
Eosinophils Absolute: 0.1 10*3/uL (ref 0.0–0.7)
HCT: 38.2 % (ref 36.0–46.0)
Hemoglobin: 12.9 g/dL (ref 12.0–15.0)
LYMPHS ABS: 1.4 10*3/uL (ref 0.7–4.0)
LYMPHS PCT: 17 %
MCH: 29 pg (ref 26.0–34.0)
MCHC: 33.8 g/dL (ref 30.0–36.0)
MCV: 85.8 fL (ref 78.0–100.0)
Monocytes Absolute: 0.4 10*3/uL (ref 0.1–1.0)
Monocytes Relative: 5 %
Neutro Abs: 6.2 10*3/uL (ref 1.7–7.7)
Neutrophils Relative %: 76 %
PLATELETS: 193 10*3/uL (ref 150–400)
RBC: 4.45 MIL/uL (ref 3.87–5.11)
RDW: 14.3 % (ref 11.5–15.5)
WBC: 8.1 10*3/uL (ref 4.0–10.5)

## 2016-09-28 LAB — WET PREP, GENITAL
Clue Cells Wet Prep HPF POC: NONE SEEN
SPERM: NONE SEEN
TRICH WET PREP: NONE SEEN
YEAST WET PREP: NONE SEEN

## 2016-09-28 LAB — RAPID HIV SCREEN (HIV 1/2 AB+AG)
HIV 1/2 Antibodies: NONREACTIVE
HIV-1 P24 Antigen - HIV24: NONREACTIVE

## 2016-09-28 LAB — ABO/RH: ABO/RH(D): O POS

## 2016-09-28 LAB — BASIC METABOLIC PANEL
Anion gap: 8 (ref 5–15)
BUN: 11 mg/dL (ref 6–20)
CO2: 22 mmol/L (ref 22–32)
Calcium: 9.1 mg/dL (ref 8.9–10.3)
Chloride: 107 mmol/L (ref 101–111)
Creatinine, Ser: 0.68 mg/dL (ref 0.44–1.00)
GFR calc Af Amer: >60 mL/min (ref >60)
GLUCOSE: 85 mg/dL (ref 65–99)
POTASSIUM: 3.6 mmol/L (ref 3.5–5.1)
Sodium: 137 mmol/L (ref 135–145)

## 2016-09-28 LAB — HCG, QUANTITATIVE, PREGNANCY: hCG, Beta Chain, Quant, S: 1891 m[IU]/mL — ABNORMAL HIGH (ref <5)

## 2016-09-28 MED ORDER — CEFTRIAXONE SODIUM 250 MG IJ SOLR
250.0000 mg | Freq: Once | INTRAMUSCULAR | Status: AC
  Administered 2016-09-28: 250 mg via INTRAMUSCULAR
  Filled 2016-09-28: qty 250

## 2016-09-28 MED ORDER — STERILE WATER FOR INJECTION IJ SOLN
INTRAMUSCULAR | Status: AC
  Administered 2016-09-28: 2.1 mL
  Filled 2016-09-28: qty 10

## 2016-09-28 MED ORDER — AZITHROMYCIN 250 MG PO TABS
1000.0000 mg | ORAL_TABLET | Freq: Once | ORAL | Status: AC
  Administered 2016-09-28: 1000 mg via ORAL
  Filled 2016-09-28: qty 4

## 2016-09-28 NOTE — ED Notes (Signed)
Patient transported to Ultrasound 

## 2016-09-28 NOTE — ED Triage Notes (Signed)
States that she has had vaginal bleeding and abdominal/back pain for 6 days. Pt is [redacted] weeks pregnant. Pt has seen her OB but does not remember what they said was wrong

## 2016-09-28 NOTE — ED Provider Notes (Signed)
[facility] Provider Note   CSN: [id number] Arrival date & time: 09/28/16  1248     History   Chief Complaint Chief Complaint  Patient presents with  . Vaginal Bleeding    HPI Rhonda Townsend is a 36 y.o. female.  HPI   36 year old female, [redacted] weeks pregnant presenting with abd pain and vaginal bleeding.   Pt is Spanish speaking, hx obtained through [employer].  For the past 6 days pt has had progressive worsening constant lower abd pain radiates to her back.  Pain is crampy with associate vaginal bleeding that has increase in frequency with little clots.  Sts she is now going through 3-4 pads a day.  She report fever of 100.5 several days ago. Report felling weak/tired.  No n/v/d, dysuria, vaginal discharge or rash.  She is a Scientist, water quality. She has not had a formal US to confirm IUP.    Past Medical History:  Diagnosis Date  . Blood transfusion without reported diagnosis    pp hemorrhage  . Medical history non-contributory   . Tachycardia 2014    Patient Active Problem List   Diagnosis Date Noted  . Amniotic fluid leaking 05/07/2014  . Language barrier, speaks Spanish only 03/11/2014  . Traumatic injury during pregnancy, antepartum   . Postpartum depression, postpartum condition 07/17/2012  . Supervision of other normal pregnancy 06/12/2012  . Previous cesarean delivery, antepartum condition or complication 02/28/2012    Past Surgical History:  Procedure Laterality Date  . CESAREAN SECTION    . CESAREAN SECTION     2003 (1st preg in [place])    [place] History    Gravida Para Term Preterm AB Living   0 6   SAB TAB Ectopic Multiple Live Births   0 0 0   6       Home Medications    Prior to Admission medications   Medication Sig Start Date End Date Taking? Authorizing Provider  ondansetron (ZOFRAN ODT) 4 MG disintegrating tablet Take 1 tablet (4 mg total) by mouth every 8 (eight) hours as needed for nausea or vomiting. 08/14/16   Elpidio Anis,  PA-C    Family History Family History  Problem Relation Age of Onset  . Alcohol abuse Neg Hx   . Arthritis Neg Hx   . Asthma Neg Hx   . Birth defects Neg Hx   . Cancer Neg Hx   . COPD Neg Hx   . Depression Neg Hx   . Diabetes Neg Hx   . Drug abuse Neg Hx   . Early death Neg Hx   . Hearing loss Neg Hx   . Heart disease Neg Hx   . Hypertension Neg Hx   . Hyperlipidemia Neg Hx   . Kidney disease Neg Hx   . Learning disabilities Neg Hx   . Mental illness Neg Hx   . Mental retardation Neg Hx   . Miscarriages / Stillbirths Neg Hx   . Stroke Neg Hx   . Vision loss Neg Hx     Social History Social History  Substance Use Topics  . Smoking status: Never Smoker  . Smokeless tobacco: Never Used  . Alcohol use No     Allergies   Patient has no known allergies.   Review of Systems Review of Systems  All other systems reviewed and are negative.    Physical Exam Updated Vital Signs BP 110/67 (BP Location: Right Arm)   Pulse 82  SpO2 98%   Physical Exam  Constitutional: She appears well-developed and well-nourished. No distress.  HENT:  Head: Atraumatic.  Eyes: Conjunctivae are normal.  Neck: Neck supple.  Cardiovascular: Normal rate and regular rhythm.   Pulmonary/Chest: Effort normal and breath sounds normal.  Abdominal: Soft. Bowel sounds are normal. She exhibits no distension. There is tenderness (tenderness to lower abd on palpation without guarding or rebound tenderness.  ).  Genitourinary:  Genitourinary Comments: Chaperone present during exam. No inguinal lymphadenopathy or inguinal hernia noted. Normal external genitalia. Moderate amount of blood noted in vaginal vault. Cervical os is closed and free of lesion or rash. No obvious vaginal discharge. On bimanual examination, bilateral adnexal tenderness and cervical motion tenderness. Nongravid abdomen.  Neurological: She is alert.  Skin: No rash noted.  Psychiatric: She has a normal mood and affect.    Nursing note and vitals reviewed.    ED Treatments / Results  Labs (all labs ordered are listed, but only abnormal results are displayed) Labs Reviewed  WET PREP, GENITAL - Abnormal; Notable for the following:       Result Value   WBC, Wet Prep HPF POC MANY (*)    All other components within normal limits  HCG, QUANTITATIVE, PREGNANCY - Abnormal; Notable for the following:    hCG, Beta Chain, Quant, S 1,891 (*)    All other components within normal limits  CBC WITH DIFFERENTIAL/PLATELET  BASIC METABOLIC PANEL  RAPID HIV SCREEN (HIV 1/2 AB+AG)  RPR  URINALYSIS, ROUTINE W REFLEX MICROSCOPIC  ABO/RH  GC/CHLAMYDIA PROBE AMP ([place]) [facility]    EKG  EKG Interpretation None       Radiology [place] Ob Comp < 14 Wks  Result Date: 09/28/2016 CLINICAL DATA:  Pregnant patient in first-trimester pregnancy with abdominal pain and vaginal bleeding. EXAM: OBSTETRIC <14 WK US AND TRANSVAGINAL OB US TECHNIQUE: Both transabdominal and transvaginal ultrasound examinations were performed for complete evaluation of the gestation as well as the maternal uterus, adnexal regions, and pelvic cul-de-sac. Transvaginal technique was performed to assess early pregnancy. COMPARISON:  None this pregnancy. FINDINGS: Intrauterine gestational sac: Single Yolk sac:  Not Visualized. Embryo:  Not Visualized. Cardiac Activity: Not Visualized. MSD: 35  mm   8 w   5  d Subchorionic hemorrhage:  Small superior to the gestational sac. Maternal uterus/adnexae: Both ovaries are visualized with normal blood flow. No adnexal mass. No pelvic free fluid. IMPRESSION: Intrauterine gestational sac with mean sac diameter of 35 mm. No yolk sac, fetal pole or cardiac activity. Findings meet definitive criteria for failed pregnancy. This follows SRU consensus guidelines: Diagnostic Criteria for Nonviable Pregnancy Early in the First Trimester. Macy Mis Engl J Med [id number]. Electronically Signed   By: Rubye OaksMelanie  Ehinger M.D.   On:  09/28/2016 20:51   [place] Ob Transvaginal  Result Date: 09/28/2016 CLINICAL DATA:  Pregnant patient in first-trimester pregnancy with abdominal pain and vaginal bleeding. EXAM: OBSTETRIC <14 WK US AND TRANSVAGINAL OB US TECHNIQUE: Both transabdominal and transvaginal ultrasound examinations were performed for complete evaluation of the gestation as well as the maternal uterus, adnexal regions, and pelvic cul-de-sac. Transvaginal technique was performed to assess early pregnancy. COMPARISON:  None this pregnancy. FINDINGS: Intrauterine gestational sac: Single Yolk sac:  Not Visualized. Embryo:  Not Visualized. Cardiac Activity: Not Visualized. MSD: 35  mm   8 w   5  d Subchorionic hemorrhage:  Small superior to the gestational sac. Maternal uterus/adnexae: Both ovaries are visualized with normal blood flow. No  adnexal mass. No pelvic free fluid. IMPRESSION: Intrauterine gestational sac with mean sac diameter of 35 mm. No yolk sac, fetal pole or cardiac activity. Findings meet definitive criteria for failed pregnancy. This follows SRU consensus guidelines: Diagnostic Criteria for Nonviable Pregnancy Early in the First Trimester. Macy Mis J Med [id number]. Electronically Signed   By: Rubye Oaks M.D.   On: 09/28/2016 20:51    Procedures Pelvic exam Date/Time: 09/28/2016 5:13 PM Performed by: Fayrene Helper Authorized by: Fayrene Helper  Consent: Verbal consent obtained. Consent given by: patient Patient understanding: patient states understanding of the procedure being performed Patient identity confirmed: verbally with patient Patient tolerance: Patient tolerated the procedure well with no immediate complications  [place] bedside Date/Time: 09/28/2016 5:14 PM Performed by: Fayrene Helper Authorized by: Fayrene Helper  Consent: Verbal consent obtained. Risks and benefits: risks, benefits and alternatives were discussed Consent given by: patient Patient understanding: patient states understanding of the  procedure being performed Patient identity confirmed: verbally with patient Comments: EMERGENCY DEPARTMENT [place] PREGNANCY "Study: Limited Ultrasound of the Pelvis for Pregnancy"  INDICATIONS:Pregnancy(required) Multiple views of the uterus and pelvic cavity were obtained in real-time with a multi-frequency probe.  APPROACH:Transabdominal  PERFORMED BY: Myself IMAGES ARCHIVED?: Yes LIMITATIONS: none PREGNANCY FREE FLUID: None ADNEXAL FINDINGS:Right ovary not seen GESTATIONAL AGE, ESTIMATE: unable to visualize IUP FETAL HEART RATE: n/a INTERPRETATION: unable to visualize IUP       (including critical care time)  Medications Ordered in ED Medications  cefTRIAXone (ROCEPHIN) injection 250 mg (not administered)  azithromycin (ZITHROMAX) tablet 1,000 mg (not administered)     Initial Impression / Assessment and Plan / ED Course  I have reviewed the triage vital signs and the nursing notes.  Pertinent labs & imaging results that were available during my care of the patient were reviewed by me and considered in my medical decision making (see chart for details).     BP 130/80 (BP Location: Right Arm)   Pulse 79   Resp 18   SpO2 100%    Final Clinical Impressions(s) / ED Diagnoses   Final diagnoses:  Miscarriage    New Prescriptions New Prescriptions   No medications on file   4:31 PM Pt is a G7P6, [redacted] weeks pregnant here with persistent vaginal bleeding concerning threatening miscarriage.  Work up initiated. Pt is non toxic in appearance.    5:17 PM I performed bedside US to assess IUP.  I was able to visualize uterus but unable to visualize IUP.  Will f/u with formal pregnancy [place].    6:15 PM delay patient care as no nurses available to sign up for care.  I have requested for stat labs to be obtain.  Pt is hemodynamically stable currently.   9:22 PM HCG is 1,891.  US demonstrates intrauterine gestational sac with finding consistent with failed pregnancy.  Wet prep  with many WBC.  Pt made aware of finding.  Will give rocephin/zithromax to cover for potential STI.  Pt recommended to f/u with [facility] for further care.  Return precaution discussed. All questions answered to patient's satisfaction.  Care discussed with Dr. Rush Landmark.     Fayrene Helper, PA-C 09/28/16 2146    Tegeler, Canary Brim, MD 09/28/16 [phone]

## 2016-09-28 NOTE — Discharge Instructions (Signed)
Unfortunately you have a miscarriage.  [facility] needed for further care.  Follow up with your regular doctor.  Return if you have any concerns.

## 2016-09-29 LAB — GC/CHLAMYDIA PROBE AMP (~~LOC~~) NOT AT ARMC
CHLAMYDIA, DNA PROBE: NEGATIVE
Neisseria Gonorrhea: NEGATIVE

## 2016-09-29 LAB — RPR: RPR Ser Ql: NONREACTIVE

## 2016-10-03 ENCOUNTER — Encounter (HOSPITAL_COMMUNITY): Payer: Self-pay

## 2016-10-03 DIAGNOSIS — N939 Abnormal uterine and vaginal bleeding, unspecified: Secondary | ICD-10-CM | POA: Insufficient documentation

## 2016-10-03 DIAGNOSIS — Z5321 Procedure and treatment not carried out due to patient leaving prior to being seen by health care provider: Secondary | ICD-10-CM | POA: Insufficient documentation

## 2016-10-03 LAB — I-STAT CHEM 8, ED
BUN: 8 mg/dL (ref 6–20)
CHLORIDE: 106 mmol/L (ref 101–111)
Calcium, Ion: 1.05 mmol/L — ABNORMAL LOW (ref 1.15–1.40)
Creatinine, Ser: 0.6 mg/dL (ref 0.44–1.00)
GLUCOSE: 108 mg/dL — AB (ref 65–99)
HEMATOCRIT: 34 % — AB (ref 36.0–46.0)
HEMOGLOBIN: 11.6 g/dL — AB (ref 12.0–15.0)
POTASSIUM: 3.7 mmol/L (ref 3.5–5.1)
SODIUM: 138 mmol/L (ref 135–145)
TCO2: 20 mmol/L — ABNORMAL LOW (ref 22–32)

## 2016-10-03 LAB — CBC WITH DIFFERENTIAL/PLATELET
BASOS ABS: 0 10*3/uL (ref 0.0–0.1)
BASOS PCT: 0 %
EOS ABS: 0.2 10*3/uL (ref 0.0–0.7)
EOS PCT: 3 %
HCT: 34.6 % — ABNORMAL LOW (ref 36.0–46.0)
Hemoglobin: 11.6 g/dL — ABNORMAL LOW (ref 12.0–15.0)
LYMPHS PCT: 19 %
Lymphs Abs: 1.3 10*3/uL (ref 0.7–4.0)
MCH: 28.8 pg (ref 26.0–34.0)
MCHC: 33.5 g/dL (ref 30.0–36.0)
MCV: 85.9 fL (ref 78.0–100.0)
MONO ABS: 0.3 10*3/uL (ref 0.1–1.0)
Monocytes Relative: 4 %
Neutro Abs: 5.1 10*3/uL (ref 1.7–7.7)
Neutrophils Relative %: 74 %
PLATELETS: 190 10*3/uL (ref 150–400)
RBC: 4.03 MIL/uL (ref 3.87–5.11)
RDW: 14 % (ref 11.5–15.5)
WBC: 6.9 10*3/uL (ref 4.0–10.5)

## 2016-10-03 LAB — TYPE AND SCREEN
ABO/RH(D): O POS
ANTIBODY SCREEN: NEGATIVE

## 2016-10-03 LAB — HCG, QUANTITATIVE, PREGNANCY: HCG, BETA CHAIN, QUANT, S: 490 m[IU]/mL — AB (ref <5)

## 2016-10-03 NOTE — ED Triage Notes (Signed)
Pt states she was dx with miscarriage Thursday and has had vaginal bleeding ever since but today at 3 pm the bleeding became significantly heavier with large clots. Pt reports she has used ~8-9 pads since 1500. Pt endorses pain "as if im going to give birth every 5 minutes". Denies fevers

## 2016-10-04 ENCOUNTER — Emergency Department (HOSPITAL_COMMUNITY)
Admission: EM | Admit: 2016-10-04 | Discharge: 2016-10-04 | Disposition: A | Discharge status: Home / Self Care | Payer: Self-pay | Attending: Emergency Medicine | Admitting: Emergency Medicine

## 2016-10-05 ENCOUNTER — Other Ambulatory Visit: Payer: Self-pay

## 2016-10-05 DIAGNOSIS — O039 Complete or unspecified spontaneous abortion without complication: Secondary | ICD-10-CM

## 2016-10-06 LAB — BETA HCG QUANT (REF LAB): hCG Quant: 228 m[IU]/mL

## 2016-10-17 ENCOUNTER — Telehealth: Payer: Self-pay | Admitting: *Deleted

## 2016-10-17 NOTE — Telephone Encounter (Signed)
-----   Message from Hermina Staggers, MD sent at 10/16/2016 11:09 AM EDT ----- Please have pt come back this week for repeat BHCG Thanks Casimiro Needle

## 2016-10-17 NOTE — Telephone Encounter (Signed)
Called patient with [facility] Interpreter ID Number [phone]. Pt did not answer voicemail left to have patient call us back for results.

## 2016-10-23 NOTE — Telephone Encounter (Signed)
Called patient using Spanish interpreter [phone]. Spoke with patient who states she is still having bleeding and pain and for the last 3 days she has been having fever as well. I told her that since she has not been seen for 2 weeks I was originally calling to have her come in for another blood test, however, given her symptoms I recommend she proceed to [facility] for further evaluation. My worry being she may have retained products of conception and could be developing an infection. Patient voiced understanding and said she would come to [facility].

## 2016-10-24 ENCOUNTER — Encounter (HOSPITAL_COMMUNITY)
Admission: AD | Disposition: A | Discharge status: Home / Self Care | Payer: Self-pay | Source: Ambulatory Visit | Attending: Family Medicine

## 2016-10-24 ENCOUNTER — Inpatient Hospital Stay (HOSPITAL_COMMUNITY): Payer: Self-pay | Admitting: Anesthesiology

## 2016-10-24 ENCOUNTER — Inpatient Hospital Stay (HOSPITAL_COMMUNITY): Payer: Self-pay

## 2016-10-24 ENCOUNTER — Inpatient Hospital Stay (HOSPITAL_COMMUNITY)
Admission: AD | Admit: 2016-10-24 | Discharge: 2016-10-24 | Disposition: A | Discharge status: Home / Self Care | Payer: Self-pay | Source: Ambulatory Visit | Attending: Family Medicine | Admitting: Family Medicine

## 2016-10-24 ENCOUNTER — Encounter (HOSPITAL_COMMUNITY): Payer: Self-pay | Admitting: *Deleted

## 2016-10-24 DIAGNOSIS — O034 Incomplete spontaneous abortion without complication: Secondary | ICD-10-CM

## 2016-10-24 DIAGNOSIS — O039 Complete or unspecified spontaneous abortion without complication: Secondary | ICD-10-CM

## 2016-10-24 HISTORY — PX: DILATION AND EVACUATION: SHX1459

## 2016-10-24 LAB — URINALYSIS, ROUTINE W REFLEX MICROSCOPIC
BILIRUBIN URINE: NEGATIVE
Bacteria, UA: NONE SEEN
GLUCOSE, UA: NEGATIVE mg/dL
KETONES UR: NEGATIVE mg/dL
Nitrite: NEGATIVE
PROTEIN: NEGATIVE mg/dL
Specific Gravity, Urine: 1.016 (ref 1.005–1.030)
pH: 7 (ref 5.0–8.0)

## 2016-10-24 LAB — HCG, QUANTITATIVE, PREGNANCY: HCG, BETA CHAIN, QUANT, S: 92 m[IU]/mL — AB (ref <5)

## 2016-10-24 LAB — POCT PREGNANCY, URINE: Preg Test, Ur: POSITIVE — AB

## 2016-10-24 SURGERY — DILATION AND EVACUATION, UTERUS
Anesthesia: General

## 2016-10-24 MED ORDER — MIDAZOLAM HCL 2 MG/2ML IJ SOLN
INTRAMUSCULAR | Status: AC
  Filled 2016-10-24: qty 2

## 2016-10-24 MED ORDER — DOXYCYCLINE HYCLATE 100 MG PO TABS: 200.0000 mg | ORAL_TABLET | Freq: Once | ORAL | Status: DC

## 2016-10-24 MED ORDER — FAMOTIDINE IN NACL 20-0.9 MG/50ML-% IV SOLN
20.0000 mg | Freq: Once | INTRAVENOUS | Status: AC
  Administered 2016-10-24: 20 mg via INTRAVENOUS
  Filled 2016-10-24: qty 50

## 2016-10-24 MED ORDER — MIDAZOLAM HCL 2 MG/2ML IJ SOLN
INTRAMUSCULAR | Status: DC | PRN
  Administered 2016-10-24: 2 mg via INTRAVENOUS

## 2016-10-24 MED ORDER — KETOROLAC TROMETHAMINE 30 MG/ML IJ SOLN
INTRAMUSCULAR | Status: AC
  Filled 2016-10-24: qty 1

## 2016-10-24 MED ORDER — LACTATED RINGERS IV SOLN
INTRAVENOUS | Status: DC
  Administered 2016-10-24: 1000 mL via INTRAVENOUS
  Administered 2016-10-24 (×2): via INTRAVENOUS

## 2016-10-24 MED ORDER — ONDANSETRON HCL 4 MG/2ML IJ SOLN
INTRAMUSCULAR | Status: DC | PRN
  Administered 2016-10-24: 4 mg via INTRAVENOUS

## 2016-10-24 MED ORDER — SCOPOLAMINE 1 MG/3DAYS TD PT72
MEDICATED_PATCH | TRANSDERMAL | Status: DC | PRN
  Administered 2016-10-24: 1 via TRANSDERMAL

## 2016-10-24 MED ORDER — OXYCODONE-ACETAMINOPHEN 5-325 MG PO TABS: 1.0000 | ORAL_TABLET | Freq: Four times a day (QID) | ORAL | 0 refills | Status: DC | PRN

## 2016-10-24 MED ORDER — MISOPROSTOL 200 MCG PO TABS
800.0000 ug | ORAL_TABLET | Freq: Once | ORAL | Status: AC
  Administered 2016-10-24: 800 ug via BUCCAL
  Filled 2016-10-24: qty 4

## 2016-10-24 MED ORDER — FENTANYL CITRATE (PF) 250 MCG/5ML IJ SOLN
INTRAMUSCULAR | Status: AC
  Filled 2016-10-24: qty 5

## 2016-10-24 MED ORDER — DEXAMETHASONE SODIUM PHOSPHATE 4 MG/ML IJ SOLN
INTRAMUSCULAR | Status: AC
  Filled 2016-10-24: qty 1

## 2016-10-24 MED ORDER — KETOROLAC TROMETHAMINE 30 MG/ML IJ SOLN
INTRAMUSCULAR | Status: DC | PRN
  Administered 2016-10-24: 30 mg via INTRAVENOUS

## 2016-10-24 MED ORDER — DOXYCYCLINE HYCLATE 100 MG IV SOLR
100.0000 mg | Freq: Two times a day (BID) | INTRAVENOUS | Status: DC
  Administered 2016-10-24: 100 mg via INTRAVENOUS
  Filled 2016-10-24: qty 100

## 2016-10-24 MED ORDER — PROPOFOL 10 MG/ML IV BOLUS
INTRAVENOUS | Status: AC
  Filled 2016-10-24: qty 20

## 2016-10-24 MED ORDER — ONDANSETRON HCL 4 MG/2ML IJ SOLN
INTRAMUSCULAR | Status: AC
  Filled 2016-10-24: qty 2

## 2016-10-24 MED ORDER — SCOPOLAMINE 1 MG/3DAYS TD PT72
MEDICATED_PATCH | TRANSDERMAL | Status: AC
  Filled 2016-10-24: qty 1

## 2016-10-24 MED ORDER — DEXAMETHASONE SODIUM PHOSPHATE 10 MG/ML IJ SOLN
INTRAMUSCULAR | Status: DC | PRN
  Administered 2016-10-24: 10 mg via INTRAVENOUS

## 2016-10-24 MED ORDER — LIDOCAINE HCL (CARDIAC) 20 MG/ML IV SOLN
INTRAVENOUS | Status: AC
  Filled 2016-10-24: qty 5

## 2016-10-24 MED ORDER — BUPIVACAINE-EPINEPHRINE 0.25% -1:200000 IJ SOLN
INTRAMUSCULAR | Status: DC | PRN
  Administered 2016-10-24: 10 mL

## 2016-10-24 MED ORDER — PROPOFOL 10 MG/ML IV BOLUS
INTRAVENOUS | Status: DC | PRN
  Administered 2016-10-24: 200 mg via INTRAVENOUS

## 2016-10-24 MED ORDER — KETOROLAC TROMETHAMINE 60 MG/2ML IM SOLN
60.0000 mg | INTRAMUSCULAR | Status: AC
  Administered 2016-10-24: 60 mg via INTRAMUSCULAR
  Filled 2016-10-24: qty 2

## 2016-10-24 MED ORDER — SOD CITRATE-CITRIC ACID 500-334 MG/5ML PO SOLN
30.0000 mL | Freq: Once | ORAL | Status: AC
  Administered 2016-10-24: 30 mL via ORAL
  Filled 2016-10-24: qty 15

## 2016-10-24 MED ORDER — FENTANYL CITRATE (PF) 100 MCG/2ML IJ SOLN
INTRAMUSCULAR | Status: DC | PRN
  Administered 2016-10-24: 50 ug via INTRAVENOUS

## 2016-10-24 MED ORDER — LIDOCAINE HCL (CARDIAC) 20 MG/ML IV SOLN
INTRAVENOUS | Status: DC | PRN
  Administered 2016-10-24: 100 mg via INTRAVENOUS

## 2016-10-24 MED ORDER — OXYCODONE-ACETAMINOPHEN 5-325 MG PO TABS
1.0000 | ORAL_TABLET | ORAL | Status: DC | PRN
  Administered 2016-10-24: 1 via ORAL

## 2016-10-24 SURGICAL SUPPLY — 19 items
CATH ROBINSON RED A/P 16FR (CATHETERS) ×3 IMPLANT
DECANTER SPIKE VIAL GLASS SM (MISCELLANEOUS) ×3 IMPLANT
GLOVE BIOGEL PI IND STRL 7.0 (GLOVE) ×1 IMPLANT
GLOVE BIOGEL PI IND STRL 7.5 (GLOVE) ×1 IMPLANT
GLOVE BIOGEL PI INDICATOR 7.0 (GLOVE) ×2
GLOVE BIOGEL PI INDICATOR 7.5 (GLOVE) ×2
GLOVE ECLIPSE 7.5 STRL STRAW (GLOVE) ×6 IMPLANT
GOWN STRL REUS W/TWL LRG LVL3 (GOWN DISPOSABLE) ×9 IMPLANT
KIT BERKELEY 1ST TRIMESTER 3/8 (MISCELLANEOUS) ×3 IMPLANT
NS IRRIG 1000ML POUR BTL (IV SOLUTION) ×3 IMPLANT
PACK VAGINAL MINOR WOMEN LF (CUSTOM PROCEDURE TRAY) ×3 IMPLANT
PAD OB MATERNITY 4.3X12.25 (PERSONAL CARE ITEMS) ×3 IMPLANT
PAD PREP 24X48 CUFFED NSTRL (MISCELLANEOUS) ×3 IMPLANT
SET BERKELEY SUCTION TUBING (SUCTIONS) ×3 IMPLANT
TOWEL OR 17X24 6PK STRL BLUE (TOWEL DISPOSABLE) ×6 IMPLANT
VACURETTE 10 RIGID CVD (CANNULA) ×2 IMPLANT
VACURETTE 7MM CVD STRL WRAP (CANNULA) IMPLANT
VACURETTE 8 RIGID CVD (CANNULA) IMPLANT
VACURETTE 9 RIGID CVD (CANNULA) IMPLANT

## 2016-10-24 NOTE — MAU Provider Note (Signed)
History     CSN: [id number]  Arrival date and time: 10/24/16 1144   First Provider Initiated Contact with Patient 10/24/16 1249 - Assessment and exam done using bedside Spanish interpretation from [facility]; explanation of U/S results and details about surgery interpreted by Orlan Leavens    Chief Complaint  Patient presents with  . Abdominal Pain  . Fever   HPI  Ms. Rhonda Townsend is a 36 y.o. [phone] presenting to MAU with complaints of abdominal pain and bleeding .  She was seen at [facility] on 9/6 for bleeding and lower abdominal cramping. Her HCG was 1,891 on that day. She had an U/S and was told she had a failed pregnancy. She was contacted yesterday by a [facility] RN for F/U.  She was told to come to MAU for evaluation yesterday after telling the RN about her heavy VB, pain and fever.  Past Medical History:  Diagnosis Date  . Blood transfusion without reported diagnosis    pp hemorrhage  . Medical history non-contributory   . Tachycardia 2014    Past Surgical History:  Procedure Laterality Date  . CESAREAN SECTION    . CESAREAN SECTION     2003 (1st preg in [place])    Family History  Problem Relation Age of Onset  . Alcohol abuse Neg Hx   . Arthritis Neg Hx   . Asthma Neg Hx   . Birth defects Neg Hx   . Cancer Neg Hx   . COPD Neg Hx   . Depression Neg Hx   . Diabetes Neg Hx   . Drug abuse Neg Hx   . Early death Neg Hx   . Hearing loss Neg Hx   . Heart disease Neg Hx   . Hypertension Neg Hx   . Hyperlipidemia Neg Hx   . Kidney disease Neg Hx   . Learning disabilities Neg Hx   . Mental illness Neg Hx   . Mental retardation Neg Hx   . Miscarriages / Stillbirths Neg Hx   . Stroke Neg Hx   . Vision loss Neg Hx     Social History  Substance Use Topics  . Smoking status: Never Smoker  . Smokeless tobacco: Never Used  . Alcohol use No    Allergies: No Known Allergies  Prescriptions Prior to Admission  Medication Sig Dispense Refill Last Dose  .  ondansetron (ZOFRAN ODT) 4 MG disintegrating tablet Take 1 tablet (4 mg total) by mouth every 8 (eight) hours as needed for nausea or vomiting. 20 tablet 0     Review of Systems  Constitutional: Negative.   HENT: Negative.   Eyes: Negative.   Respiratory: Negative.   Cardiovascular: Negative.   Gastrointestinal: Positive for abdominal pain.  Endocrine: Negative.   Genitourinary: Positive for pelvic pain and vaginal bleeding (continuously since 9/6).  Musculoskeletal: Negative.   Skin: Negative.   Allergic/Immunologic: Negative.   Neurological: Negative.   Hematological: Negative.   Psychiatric/Behavioral: Negative.    Physical Exam   Blood pressure 112/66, pulse 80, temperature 98.4 F (36.9 C), temperature source Oral, resp. rate 15, height  (1.6 m), weight 61.7 kg (136 lb), SpO2 100 %.  Physical Exam  Nursing note and vitals reviewed. Constitutional: She is oriented to person, place, and time. She appears well-developed and well-nourished.  HENT:  Head: Normocephalic.  Eyes: Pupils are equal, round, and reactive to light.  Neck: Normal range of motion.  Cardiovascular: Normal rate, regular rhythm and normal heart sounds.  Respiratory: Effort normal and breath sounds normal.  GI: Soft. Bowel sounds are normal. There is tenderness. There is rebound and guarding.  Genitourinary:  Genitourinary Comments: Uterus: mildly tender, cx: smooth, pink, no lesions, moderate amt of thick, dark red blood - yellow tinged ?POC removed with Ring forcep, closed/long/firm, no CMT or friability, bilateral adnexal tenderness   Musculoskeletal: Normal range of motion.  Neurological: She is alert and oriented to person, place, and time.  Skin: Skin is warm and dry.  Psychiatric: She has a normal mood and affect. Her behavior is normal. Judgment and thought content normal.    MAU Course  Procedures  MDM [facility] UPT HCG OB Limited U/S OB transvaginal U/S Toradol 60 mg IM -- pain  relieved from 8/10 to 3-4/10  *Consult with Dr. Adrian Blackwater @ 1520 - notified of patient's complaints, assessments, lab & U/S results - give Cytotec 800 mcg buccally, will come to see patient  Results for orders placed or performed during the hospital encounter of 10/24/16 (from the past 24 hour(s))  Urinalysis, Routine w reflex microscopic     Status: Abnormal   Collection Time: 10/24/16 12:03 PM  Result Value Ref Range   Color, Urine YELLOW YELLOW   APPearance HAZY (A) CLEAR   Specific Gravity, Urine 1.016 1.005 - 1.030   pH 7.0 5.0 - 8.0   Glucose, UA NEGATIVE NEGATIVE mg/dL   Hgb urine dipstick MODERATE (A) NEGATIVE   Bilirubin Urine NEGATIVE NEGATIVE   Ketones, ur NEGATIVE NEGATIVE mg/dL   Protein, ur NEGATIVE NEGATIVE mg/dL   Nitrite NEGATIVE NEGATIVE   Leukocytes, UA MODERATE (A) NEGATIVE   RBC / HPF TOO NUMEROUS TO COUNT 0 - 5 RBC/hpf   WBC, UA 6-30 0 - 5 WBC/hpf   Bacteria, UA NONE SEEN NONE SEEN   Squamous Epithelial / LPF 0-5 (A) NONE SEEN   Mucus PRESENT   Pregnancy, urine POC     Status: Abnormal   Collection Time: 10/24/16 12:35 PM  Result Value Ref Range   Preg Test, Ur POSITIVE (A) NEGATIVE   US Ob Transvaginal  Result Date: 10/24/2016 CLINICAL DATA:  Recent miscarriage.  Positive beta HCG. EXAM: TRANSVAGINAL OB ULTRASOUND TECHNIQUE: Transvaginal ultrasound was performed for complete evaluation of the gestation as well as the maternal uterus, adnexal regions, and pelvic cul-de-sac. COMPARISON:  09/28/2016 FINDINGS: Intrauterine gestational sac: None Maternal uterus/adnexae: A small amount of simple fluid is seen within the endometrial cavity. This outlines a rounded masslike density along the posterior wall of the fundal portion of the endometrium which measures approximately 1.7 by 1.9 x 0.8 cm. This shows hypervascularity on color Doppler ultrasound, suspicious for a small amount of retained products of conception. Both ovaries are normal in appearance. No adnexal mass or  abnormal free fluid identified. IMPRESSION: 1.9 cm rounded masslike density along posterior wall of the fundal portion of endometrial cavity, suspicious for retained products of conception. Electronically Signed   By: Myles Rosenthal M.D.   On: 10/24/2016 14:45    Assessment and Plan  Retained products of conception after miscarriage - Prep for D&E - Care assumed by Dr. Adrian Blackwater at 1600 -- see his H&P for further documentation  Raelyn Mora, MSN, CNM 10/24/2016, 1:27 PM

## 2016-10-24 NOTE — Discharge Instructions (Signed)
Aborto espontneo (Miscarriage) El aborto espontneo es la prdida de un beb que no ha nacido.(feto) antes de la semana 20 del embarazo. La causa generalmente es desconocida. CUIDADOS EN EL HOGAR  Debe permanecer en cama (reposo en cama) o podr hacer actividades livianas. Regrese a sus actividades segn las indicaciones del mdico.  Pida ayuda con las tareas domsticas.  Anote cuntos apsitos [place] por da. Describa el grado en que estn empapados.  No use tampones. No se higienice la vagina (duchas vaginales) ni tenga relaciones sexuales (coito) hasta que el mdico la autorice.  Slo debe tomar la medicacin segn las indicaciones del mdico.  No tome aspirina.  Cumpla con los controles mdicos segn las indicaciones.  Si usted o su pareja tienen problemas con el duelo, hable con su mdico. Tambin puede intentar con psicoterapia. Permtase el tiempo suficiente de duelo antes de quedar embarazada nuevamente.  SOLICITE AYUDA DE INMEDIATO SI:  Siente clicos intensos o dolor en el estmago, en la espalda o en el vientre (abdomen).  Tiene fiebre.  Elimina grumos de sangre (cogulos) por la vagina, que tienen el tamao de una nuez o ms. Guarde los cogulos para que el [employer] vea.  Elimina gran cantidad de tejidos por la vagina. Guarde lo que ha eliminado para que su mdico lo examine.  Aumenta el sangrado.  Observa una secrecin espesa, con mal olor (prdida) que proviene de la vagina.  Se siente mareada, dbil o se desvanece (se desmaya).  Siente escalofros.  ASEGRESE DE QUE:  Comprende estas instrucciones.  Controlar su enfermedad.  Solicitar ayuda de inmediato si no mejora o si empeora.  Esta informacin no tiene Theme park manager el consejo del mdico. Asegrese de hacerle al mdico cualquier pregunta que tenga. Document Released: 07/11/2011 Document Revised: 07/11/2011 Document Reviewed: 02/09/2011 Elsevier Interactive Patient Education  2017  Elsevier [employer] D&E (Dilation and Evacuation) Dilation and evacuation (D&E) is a minor operation. It involves stretching (dilation) the cervix and evacuation of the uterus. During the procedure, the cervix is dilated and tissue is gently suctioned from the inside of the uterus.  REASONS FOR DOING D&E  Removal of retained placenta after giving birth.   Abortion.  Miscarriage.  RISKS AND COMPLICATIONS  Putting a hole (perforation) in the uterus.  Excessive bleeding after the D&E.   Infection of the uterus.   Damage to the cervix.   Developing scar tissue (adhesions) inside the uterus, later causing abnormal bleeding or no monthly bleeding (amenorrhea) or problems with fertility.  Complications from general or local anesthetic.     PROCEDURE  You may be given a drug to make you sleep (general anesthetic) or a drug that numbs the area (local anesthetic) in and around the cervix.   You will lie on your back with your legs in stirrups.   A curved tool (suction curette) will be used to evacuate the uterus and will then be removed.  This usually takes around 15 to 30 minutes.  AFTER THE PROCEDURE  You will rest in the recovery room until you are stable and feel ready to go home.   You may feel sick to your stomach (nauseous) or throw up (vomit) if you had general anesthesia.   You may have light cramping and bleeding for a couple days to 2 weeks after the procedure.   Your uterus needs to make new lining after a D&E. This may make your next period late.   HOME CARE INSTRUCTIONS  Do not drive for 24  hours.   Wait 1 week before returning to strenuous activities.   You may resume your usual diet.   Drink enough water and fluids to keep your urine clear or pale yellow.   You should return to your usual bowel function. If constipation occurs, you may:   Take a mild laxative with permission from your caregiver.   Add fruit and bran to your diet.   Take showers instead of  baths for two weeks  Do not go swimming or use a hot tub until your caregiver gives you permission.   Have someone with you or available for you the first 24 to 48 hours, especially if you had a general anesthetic.   Do not douche, use tampons, or have intercourse until after your follow-up appointment, or when your caregiver approves.   Only take over-the-counter or prescription medicines for pain, discomfort, or fever as directed by your caregiver. Do not take aspirin. It can cause bleeding.   If a prescription has been given to you, follow your caregiver's directions. You may be given a medicine that kills germs (antibiotic) to prevent an infection.   Keep all your follow-up appointments recommended by your caregiver.   SEEK MEDICAL CARE IF:  You have increasing cramps or pain not relieved with medicine.   You develop belly (abdominal) pain, which does not seem to be related to the same area as your earlier cramping and pain.   You feel dizzy or feel like fainting.   You have a bad smelling vaginal discharge.   You develop a rash.   You develop a reaction or allergy to your medicine.   SEEK IMMEDIATE MEDICAL CARE IF:  Bleeding is heavier than a normal menstrual period.   You have an oral temperature above 101F, not controlled by medicine.   You develop chest pain.   You develop shortness of breath.   You pass out.   You develop heavy vaginal bleeding with or without blood clots.   MAKE SURE YOU:  Understand these instructions.   Will watch your condition.   Will get help right away if you are not doing well or get worse.   UPDATED HEALTH PRACTICES  A Pap smear is done to screen for cervical cancer.   The first Pap smear should be done at age 58.   Between ages 69 and 38, Pap smears are repeated every 2 years.   Beginning at age 67, you are advised to have a Pap smear every 3 years as long as your past 3 Pap smears have been normal.   Some women have  medical problems that increase the chance of getting cervical cancer. Talk to your caregiver about these problems. It is especially important to talk to your caregiver if a new problem develops soon after your last Pap smear. In these cases, your caregiver may recommend more frequent screening and Pap smears.   The above recommendations are the same for women who have or have not gotten the vaccine for HPV (human papillomavirus).   If you had a uterus removal (hysterectomy) for a problem that was not a cancer or a condition that could lead to cancer, then you no longer need Pap smears.   If you are between ages 90 and 64, and you have had normal Pap smears going back 10 years, you no longer need Pap smears.   If you have had past treatment for cervical cancer or a condition that could lead to cancer, you need Pap smears  and screening for cancer for at least 20 years after your treatment.   Continue monthly breast self-examinations. Your caregiver can provide information and instructions for breast self-examination.  ExitCare Patient Information 2011 [place].

## 2016-10-24 NOTE — Anesthesia Postprocedure Evaluation (Signed)
Anesthesia Post Note  Patient: Rhonda Townsend  Procedure(s) Performed: DILATATION AND EVACUATION (N/A )     Patient location during evaluation: PACU Anesthesia Type: General Level of consciousness: sedated Pain management: pain level controlled Vital Signs Assessment: post-procedure vital signs reviewed and stable Respiratory status: spontaneous breathing and respiratory function stable Cardiovascular status: stable Postop Assessment: no apparent nausea or vomiting Anesthetic complications: no    Last Vitals:  Vitals:   10/24/16 2030 10/24/16 2045  BP: 113/70 106/68  Pulse: 64 64  Resp: 18 18  Temp:    SpO2: 100% 100%    Last Pain:  Vitals:   10/24/16 2045  TempSrc:   PainSc: 6    Pain Goal:                 Kimorah Ridolfi DANIEL

## 2016-10-24 NOTE — Transfer of Care (Signed)
Immediate Anesthesia Transfer of Care Note  Patient: Rhonda Townsend  Procedure(s) Performed: DILATATION AND EVACUATION (N/A )  Patient Location: PACU  Anesthesia Type:General  Level of Consciousness: sedated  Airway & Oxygen Therapy: Patient Spontanous Breathing and Patient connected to nasal cannula oxygen  Post-op Assessment: Report given to RN and Post -op Vital signs reviewed and stable  Post vital signs: Reviewed and stable  Last Vitals:  Vitals:   10/24/16 1201 10/24/16 1605  BP: 112/66   Pulse: 80   Resp: 15 16  Temp: 36.9 C 36.9 C  SpO2: 100%     Last Pain:  Vitals:   10/24/16 1800  TempSrc:   PainSc: 3          Complications: No apparent anesthesia complications

## 2016-10-24 NOTE — Anesthesia Preprocedure Evaluation (Signed)
Anesthesia Evaluation  Patient identified by MRN, date of birth, ID band Patient awake    Reviewed: Allergy & Precautions, H&P , NPO status , Patient's Chart, lab work & pertinent test results  History of Anesthesia Complications Negative for: history of anesthetic complications  Airway Mallampati: II  TM Distance: >3 FB Neck ROM: full    Dental no notable dental hx. (+) Teeth Intact, Dental Advisory Given   Pulmonary neg pulmonary ROS,    Pulmonary exam normal breath sounds clear to auscultation       Cardiovascular negative cardio ROS   Rhythm:regular Rate:Normal     Neuro/Psych negative neurological ROS  negative psych ROS   GI/Hepatic negative GI ROS, Neg liver ROS,   Endo/Other  negative endocrine ROS  Renal/GU negative Renal ROS  negative genitourinary   Musculoskeletal   Abdominal Normal abdominal exam  (+)   Peds  Hematology negative hematology ROS (+)   Anesthesia Other Findings   Reproductive/Obstetrics (+) Pregnancy                             Anesthesia Physical  Anesthesia Plan  ASA: II  Anesthesia Plan: General   Post-op Pain Management:    Induction: Intravenous  PONV Risk Score and Plan: 3 and Ondansetron, Dexamethasone and Scopolamine patch - Pre-op  Airway Management Planned: LMA  Additional Equipment:   Intra-op Plan:   Post-operative Plan: Extubation in OR  Informed Consent: I have reviewed the patients History and Physical, chart, labs and discussed the procedure including the risks, benefits and alternatives for the proposed anesthesia with the patient or authorized representative who has indicated his/her understanding and acceptance.   Dental advisory given  Plan Discussed with: CRNA, Anesthesiologist and Surgeon  Anesthesia Plan Comments:         Anesthesia Quick Evaluation

## 2016-10-24 NOTE — MAU Note (Signed)
Pt reports lower abd pain since she had a miscarriage on 09/06, scant bleeding but the pain will not go away.

## 2016-10-24 NOTE — H&P (Signed)
Preoperative History and Physical  Rhonda Townsend is a 36 y.o. [phone] seen in MAU for pelvic pain and bleeding. Workup shows retained products of conception. She has been having some subjective fevers, chills, and nausea.  Proposed surgery: D&E  Past Medical History:  Diagnosis Date  . Blood transfusion without reported diagnosis    pp hemorrhage  . Medical history non-contributory   . Tachycardia 2014   Past Surgical History:  Procedure Laterality Date  . CESAREAN SECTION    . CESAREAN SECTION     2003 (1st preg in [place])   [place] History    Gravida Para Term Preterm AB Living   0 6   SAB TAB Ectopic Multiple Live Births   0 0 0   6     Patient denies any cervical dysplasia or STIs. No current facility-administered medications on file prior to encounter.    Current Outpatient Prescriptions on File Prior to Encounter  Medication Sig Dispense Refill  . ondansetron (ZOFRAN ODT) 4 MG disintegrating tablet Take 1 tablet (4 mg total) by mouth every 8 (eight) hours as needed for nausea or vomiting. 20 tablet 0   No Known Allergies Social History:   reports that she has never smoked. She has never used smokeless tobacco. She reports that she does not drink alcohol or use drugs.  Family History  Problem Relation Age of Onset  . Alcohol abuse Neg Hx   . Arthritis Neg Hx   . Asthma Neg Hx   . Birth defects Neg Hx   . Cancer Neg Hx   . COPD Neg Hx   . Depression Neg Hx   . Diabetes Neg Hx   . Drug abuse Neg Hx   . Early death Neg Hx   . Hearing loss Neg Hx   . Heart disease Neg Hx   . Hypertension Neg Hx   . Hyperlipidemia Neg Hx   . Kidney disease Neg Hx   . Learning disabilities Neg Hx   . Mental illness Neg Hx   . Mental retardation Neg Hx   . Miscarriages / Stillbirths Neg Hx   . Stroke Neg Hx   . Vision loss Neg Hx     Review of Systems: Full 10 systems review of systems preformed, which were normal other than what was stated in the HPI.  PHYSICAL  EXAM: Blood pressure 112/66, pulse 80, temperature 98.5 F (36.9 C), resp. rate 16, height  (1.6 m), weight 136 lb (61.7 kg), SpO2 100 %, currently breastfeeding. General appearance - alert, well appearing, and in no distress Head - Normocephalic, atraumatic.  Right and left external ears normal. Eyes - EOMI.  Nonicteric.  Normal conjunctiva Neck - supple, no lymphadenopathy.  No tracheal deviation Chest - clear to auscultation, no wheezes, rales or rhonchi, symmetric air entry Heart - normal rate and regular rhythm Abdomen - soft, nontender, nondistended, no masses or organomegaly Pelvic - examination not indicated Extremities - peripheral pulses normal, no pedal edema, no clubbing or cyanosis Skin - Warm to touch. no bruises, rashes, wounds. Neuro - Oriented x3.  Cranial nerves intact. Psych - normal thought process.  Judgement intact.  Labs: Results for orders placed or performed during the hospital encounter of 10/24/16 (from the past 336 hour(s))  Urinalysis, Routine w reflex microscopic   Collection Time: 10/24/16 12:03 PM  Result Value Ref Range   Color, Urine YELLOW YELLOW   APPearance HAZY (A) CLEAR   Specific Gravity, Urine 1.016  1.005 - 1.030   pH 7.0 5.0 - 8.0   Glucose, UA NEGATIVE NEGATIVE mg/dL   Hgb urine dipstick MODERATE (A) NEGATIVE   Bilirubin Urine NEGATIVE NEGATIVE   Ketones, ur NEGATIVE NEGATIVE mg/dL   Protein, ur NEGATIVE NEGATIVE mg/dL   Nitrite NEGATIVE NEGATIVE   Leukocytes, UA MODERATE (A) NEGATIVE   RBC / HPF TOO NUMEROUS TO COUNT 0 - 5 RBC/hpf   WBC, UA 6-30 0 - 5 WBC/hpf   Bacteria, UA NONE SEEN NONE SEEN   Squamous Epithelial / LPF 0-5 (A) NONE SEEN   Mucus PRESENT   Pregnancy, urine POC   Collection Time: 10/24/16 12:35 PM  Result Value Ref Range   Preg Test, Ur POSITIVE (A) NEGATIVE  hCG, quantitative, pregnancy   Collection Time: 10/24/16  1:54 PM  Result Value Ref Range   hCG, Beta Chain, Quant, S 92 (H) <5 mIU/mL    Imaging  Studies: US Ob Comp < 14 Wks  Result Date: 09/28/2016 CLINICAL DATA:  Pregnant patient in first-trimester pregnancy with abdominal pain and vaginal bleeding. EXAM: OBSTETRIC <14 WK [place] AND TRANSVAGINAL OB US TECHNIQUE: Both transabdominal and transvaginal ultrasound examinations were performed for complete evaluation of the gestation as well as the maternal uterus, adnexal regions, and pelvic cul-de-sac. Transvaginal technique was performed to assess early pregnancy. COMPARISON:  None this pregnancy. FINDINGS: Intrauterine gestational sac: Single Yolk sac:  Not Visualized. Embryo:  Not Visualized. Cardiac Activity: Not Visualized. MSD: 35  mm   8 w   5  d Subchorionic hemorrhage:  Small superior to the gestational sac. Maternal uterus/adnexae: Both ovaries are visualized with normal blood flow. No adnexal mass. No pelvic free fluid. IMPRESSION: Intrauterine gestational sac with mean sac diameter of 35 mm. No yolk sac, fetal pole or cardiac activity. Findings meet definitive criteria for failed pregnancy. This follows SRU consensus guidelines: Diagnostic Criteria for Nonviable Pregnancy Early in the First Trimester. Macy Mis J Med [phone]. Electronically Signed   By: Rubye Oaks M.D.   On: 09/28/2016 20:51   US Ob Transvaginal  Result Date: 10/24/2016 CLINICAL DATA:  Recent miscarriage.  Positive beta HCG. EXAM: TRANSVAGINAL OB ULTRASOUND TECHNIQUE: Transvaginal ultrasound was performed for complete evaluation of the gestation as well as the maternal uterus, adnexal regions, and pelvic cul-de-sac. COMPARISON:  09/28/2016 FINDINGS: Intrauterine gestational sac: None Maternal uterus/adnexae: A small amount of simple fluid is seen within the endometrial cavity. This outlines a rounded masslike density along the posterior wall of the fundal portion of the endometrium which measures approximately 1.7 by 1.9 x 0.8 cm. This shows hypervascularity on color Doppler ultrasound, suspicious for a small amount of  retained products of conception. Both ovaries are normal in appearance. No adnexal mass or abnormal free fluid identified. IMPRESSION: 1.9 cm rounded masslike density along posterior wall of the fundal portion of endometrial cavity, suspicious for retained products of conception. Electronically Signed   By: Myles Rosenthal M.D.   On: 10/24/2016 14:45   US Ob Transvaginal  Result Date: 09/28/2016 CLINICAL DATA:  Pregnant patient in first-trimester pregnancy with abdominal pain and vaginal bleeding. EXAM: OBSTETRIC <14 WK [place] AND TRANSVAGINAL OB US TECHNIQUE: Both transabdominal and transvaginal ultrasound examinations were performed for complete evaluation of the gestation as well as the maternal uterus, adnexal regions, and pelvic cul-de-sac. Transvaginal technique was performed to assess early pregnancy. COMPARISON:  None this pregnancy. FINDINGS: Intrauterine gestational sac: Single Yolk sac:  Not Visualized. Embryo:  Not Visualized. Cardiac Activity: Not Visualized. MSD: 58  mm   8 w   5  d Subchorionic hemorrhage:  Small superior to the gestational sac. Maternal uterus/adnexae: Both ovaries are visualized with normal blood flow. No adnexal mass. No pelvic free fluid. IMPRESSION: Intrauterine gestational sac with mean sac diameter of 35 mm. No yolk sac, fetal pole or cardiac activity. Findings meet definitive criteria for failed pregnancy. This follows SRU consensus guidelines: Diagnostic Criteria for Nonviable Pregnancy Early in the First Trimester. Macy Mis J Med [phone]. Electronically Signed   By: Rubye Oaks M.D.   On: 09/28/2016 20:51    Assessment: Patient Active Problem List   Diagnosis Date Noted  . Retained products of conception after miscarriage 10/24/2016  . Amniotic fluid leaking 05/07/2014  . Language barrier, speaks Spanish only 03/11/2014  . Traumatic injury during pregnancy, antepartum   . Postpartum depression, postpartum condition 07/17/2012  . Supervision of other normal  pregnancy 06/12/2012  . Previous cesarean delivery, antepartum condition or complication 02/28/2012    Plan: Patient will undergo surgical management with dilation and evacuation.   The risks of surgery were discussed in detail with the patient including but not limited to: bleeding which may require transfusion or reoperation; infection which may require antibiotics; injury to surrounding organs which may involve bowel, bladder, ureters ; need for additional procedures including laparoscopy or laparotomy; thromboembolic phenomenon, surgical site problems and other postoperative/anesthesia complications. Likelihood of success in alleviating the patient's condition was discussed. Routine postoperative instructions will be reviewed with the patient and her family in detail after surgery.  The patient concurred with the proposed plan, giving informed written consent for the surgery.  Patient has been NPO since last night she will remain NPO for procedure.  Anesthesia and OR aware.  Preoperative prophylactic antibiotics and SCDs ordered on call to the OR.  To OR when ready.  Levie Heritage, DO  10/24/2016, 4:05 PM

## 2016-10-24 NOTE — Anesthesia Procedure Notes (Signed)
Procedure Name: LMA Insertion Date/Time: 10/24/2016 7:08 PM Performed by: Junious Silk Pre-anesthesia Checklist: Patient identified, Emergency Drugs available, Suction available, Patient being monitored and Timeout performed Patient Re-evaluated:Patient Re-evaluated prior to induction Oxygen Delivery Method: Circle system utilized Preoxygenation: Pre-oxygenation with 100% oxygen Induction Type: IV induction Ventilation: Mask ventilation without difficulty LMA: LMA inserted LMA Size: 4.0 Number of attempts: 1 Placement Confirmation: positive ETCO2,  CO2 detector and breath sounds checked- equal and bilateral Tube secured with: Tape Dental Injury: Teeth and Oropharynx as per pre-operative assessment

## 2016-10-24 NOTE — Op Note (Signed)
Rhonda Townsend PROCEDURE DATE: 10/24/2016  PREOPERATIVE DIAGNOSIS: Retained products of conception. POSTOPERATIVE DIAGNOSIS: The same. PROCEDURE:     Dilation and Evacuation. SURGEON:  Dr. Adrian Blackwater  INDICATIONS: 36 y.o. [phone] with incomplete AB and retained products of conception, needing surgical completion.  Risks of surgery were discussed with the patient including but not limited to: bleeding which may require transfusion; infection which may require antibiotics; injury to uterus or surrounding organs;need for additional procedures including laparotomy or laparoscopy; possibility of intrauterine scarring which may impair future fertility; and other postoperative/anesthesia complications. Written informed consent was obtained.    FINDINGS:  A 10 size anteverted uterus, moderate amounts of products of conception, specimen sent to pathology.  ANESTHESIA:    Monitored intravenous sedation, paracervical block. INTRAVENOUS FLUIDS:  1500 ml of LR ESTIMATED BLOOD LOSS:  25 ml. SPECIMENS:  Products of conception sent to pathology COMPLICATIONS:  None immediate.  PROCEDURE DETAILS:  The patient received intravenous antibiotics while in the preoperative area.  She was then taken to the operating room where general anesthesia was administered and was found to be adequate.  After an adequate timeout was performed, she was placed in the dorsal lithotomy position and examined; then prepped and draped in the sterile manner.   Her bladder was catheterized for an unmeasured amount of clear, yellow urine. A vaginal speculum was then placed in the patient's vagina. Products of conception were visualized at the os. This was removed using ring forceps. A paracervical block using 1% Marcaine was administered.  A single tooth tenaculum was then applied to the anterior lip of the cervix. The cervix was gently dilated to accommodate a 10 mm suction curette that was gently advanced to the uterine fundus.  The suction  device was then activated and curette slowly rotated to clear the uterus of products of conception.  A sharp curettage was then performed to confirm completer emptying of the uterus.There was minimal bleeding noted and the tenaculum removed with good hemostasis noted.  The patient tolerated the procedure well.  The patient was taken to the recovery area in stable condition.   Levie Heritage, DO 10/24/2016 7:33 PM

## 2016-10-25 ENCOUNTER — Telehealth: Payer: Self-pay | Admitting: *Deleted

## 2016-10-25 ENCOUNTER — Encounter (HOSPITAL_COMMUNITY): Payer: Self-pay | Admitting: Family Medicine

## 2016-10-25 NOTE — Telephone Encounter (Signed)
Received message left on nurse voicemail today at 1301 from [facility] with [facility].  States she is calling about percocet prescription.  Phoned pharmacy spoke with Okey Regal.  States the pharmacist needs to speak to the provider as the prescription is over the legal limit for them to fill.  I explained Dr. Adrian Blackwater was off today and asked if they could speak to one of his partners.  Okey Regal said she thought that would be okay.  Told her I would have someone call them back.  Spoke with Dr. Macon Large.  States she will call and speak to the pharmacist.

## 2016-11-09 ENCOUNTER — Encounter: Payer: Self-pay | Admitting: Family Medicine

## 2016-11-09 ENCOUNTER — Ambulatory Visit (INDEPENDENT_AMBULATORY_CARE_PROVIDER_SITE_OTHER): Payer: Self-pay | Admitting: Family Medicine

## 2016-11-09 ENCOUNTER — Ambulatory Visit (INDEPENDENT_AMBULATORY_CARE_PROVIDER_SITE_OTHER): Payer: Self-pay | Admitting: Clinical

## 2016-11-09 VITALS — BP 105/68 | HR 75 | Wt 134.0 lb

## 2016-11-09 DIAGNOSIS — F53 Postpartum depression: Secondary | ICD-10-CM

## 2016-11-09 DIAGNOSIS — F419 Anxiety disorder, unspecified: Secondary | ICD-10-CM

## 2016-11-09 DIAGNOSIS — F32A Depression, unspecified: Secondary | ICD-10-CM

## 2016-11-09 DIAGNOSIS — N719 Inflammatory disease of uterus, unspecified: Secondary | ICD-10-CM

## 2016-11-09 DIAGNOSIS — F329 Major depressive disorder, single episode, unspecified: Secondary | ICD-10-CM

## 2016-11-09 DIAGNOSIS — O99345 Other mental disorders complicating the puerperium: Secondary | ICD-10-CM

## 2016-11-09 DIAGNOSIS — O034 Incomplete spontaneous abortion without complication: Secondary | ICD-10-CM

## 2016-11-09 DIAGNOSIS — F4321 Adjustment disorder with depressed mood: Secondary | ICD-10-CM

## 2016-11-09 DIAGNOSIS — Z789 Other specified health status: Secondary | ICD-10-CM

## 2016-11-09 LAB — POCT URINALYSIS DIP (DEVICE)
Bilirubin Urine: NEGATIVE
Glucose, UA: NEGATIVE mg/dL
Hgb urine dipstick: NEGATIVE
KETONES UR: NEGATIVE mg/dL
Leukocytes, UA: NEGATIVE
Nitrite: NEGATIVE
PH: 5.5 (ref 5.0–8.0)
PROTEIN: NEGATIVE mg/dL
SPECIFIC GRAVITY, URINE: 1.025 (ref 1.005–1.030)
Urobilinogen, UA: 0.2 mg/dL (ref 0.0–1.0)

## 2016-11-09 MED ORDER — METRONIDAZOLE 500 MG PO TABS: 500.0000 mg | ORAL_TABLET | Freq: Three times a day (TID) | ORAL | 0 refills | Status: AC

## 2016-11-09 MED ORDER — CIPROFLOXACIN HCL 500 MG PO TABS: 500.0000 mg | ORAL_TABLET | Freq: Two times a day (BID) | ORAL | 0 refills | Status: AC

## 2016-11-09 NOTE — BH Specialist Note (Signed)
Integrated Behavioral Health Initial Visit  MRN: [id number] Name: Rhonda Townsend  Number of Integrated Behavioral Health Clinician visits:: 1/6 Session Start time: 4:30  Session End time: 5:00 Total time: 30 minutes  Type of Service: Integrated Behavioral Health- Individual/Family Interpretor:Yes.   Interpretor Name and Language: Spanish   Warm Hand Off Completed.       SUBJECTIVE: Rhonda Townsend is a 36 y.o. female accompanied by Interpreter Patient was referred by Dr Adrian BlackwaterStinson for depression. Patient reports the following symptoms/concerns: Pt states her primary concern is feeling depressed after miscarriage, isolating herself from others, with SI two days ago. Pt denies current SI, her faith is her greatest strength.  Duration of problem: Over one month; Severity of problem: severe  OBJECTIVE: Mood: Depressed and Affect: Depressed and Tearful Risk of harm to self or others: Suicidal ideation No plan to harm self or others Thoughts of taking pills, but no intent  LIFE CONTEXT: Family and Social: Lives with husband and children; attends religious services up to 4 days/week. Pt's mother lives outside of the country, and no other close family local School/Work: - Self-Care: Prayer Life Changes: Recent miscarriage  GOALS ADDRESSED: Patient will: 1. Reduce symptoms of: depression 2. Demonstrate ability to: Increase healthy adjustment to current life circumstances and Begin healthy grieving over loss  INTERVENTIONS: Interventions utilized: Supportive Counseling and Psychoeducation and/or Health Education  Standardized Assessments completed: GAD-7 and PHQ 2&9 with C-SSRS  ASSESSMENT: Patient currently experiencing Grief .   Patient may benefit from psychoeducation and brief therapeutic intervention regarding coping with symptoms of depression following grief.  PLAN: 1. Follow up with behavioral health clinician on : F/u via phone in two business days 2. Behavioral  recommendations:  -Follow Safety plan -[facility], leave message with Spanish-speaking counselors to set up initial appointment -Consider talking to female pastor or other women at church about feelings and the need for emotional support at this time 3. Referral(s): Integrated [facility] (In Clinic) and [facility] (LME/Outside Clinic) 4. "From scale of 1-10, how likely are you to follow plan?": 7  Rae LipsJamie C McMannes, LCSWA  Depression screen [facility] 11/09/2016 12/31/2015  Decreased Interest 2 3  Down, Depressed, Hopeless 2 3  PHQ - 2 Score 4 6  Altered sleeping 3 3  Tired, decreased energy 3 2  Change in appetite 3 3  Feeling bad or failure about yourself  3 2  Trouble concentrating 2 2  Moving slowly or fidgety/restless 2 3  Suicidal thoughts 2 2  PHQ-9 Score 22 23   GAD 7 : Generalized Anxiety Score 11/09/2016 12/31/2015  Nervous, Anxious, on Edge 2 2  Control/stop worrying 2 3  Worry too much - different things 2 3  Trouble relaxing 2 3  Restless 2 3  Easily annoyed or irritable 3 3  Afraid - awful might happen 3 3  Total GAD 7 Score 16 20

## 2016-11-09 NOTE — Patient Instructions (Signed)
My Safety Plan:   Step 1: Warning signs (thoughts, images, mood, situation, behavior) that a crisis may be developing: When I isolate myself from others  Step 2: Agencies I can contact during a crisis:      1. 9-1-1     2. [facility] (24/7 walk-in) [address], [place], [place]     3. [facility]: Intake- [phone]/ [phone]     4. Closest Emergency Room Address: [facility]  Suicide Prevention Lifeline Phone: [phone]  Call [facility] to set up initial appointment with Spanish-speaking counselors at [phone]  Step 7: The one thing that is most important to me and worth living for is: I'm always praying, God helps me to keep going   Signature of Patient: _________________________________________________  Signature of Provider: ________________________________________________  [facility] [address], [place], [place] [phone]  [facility] [address], [place], [place] [phone]  [facility] [address], [place], [place] [phone]  [facility] [address] [phone]  [facility] [address], [place], [place] [phone]

## 2016-11-09 NOTE — Progress Notes (Signed)
Pt stated having lower abdominal/back/legs pain since left the MAU.

## 2016-11-09 NOTE — Addendum Note (Signed)
Addended by: Garret ReddishBARNES, Daphna Lafuente M on: 11/09/2016 04:20 PM   Modules accepted: Orders

## 2016-11-09 NOTE — Progress Notes (Signed)
   Subjective:    Patient ID: Rhonda Townsend, female    DOB: 1980-12-01, 36 y.o.   MRN: [id number]  HPI Patient seen 2 weeks postop from D&E for retained products of conception. She reports abdominal, back and leg pain since after surgery. Bleeding: stopped 2 days ago. Reports fever with nausea and vomiting 2 days ago.   Review of Systems     Objective:   Physical Exam  Constitutional: She appears well-developed and well-nourished.  Cardiovascular: Normal rate, regular rhythm and normal heart sounds.   Pulmonary/Chest: Effort normal and breath sounds normal.  Abdominal: Soft. Bowel sounds are normal. She exhibits no distension. There is tenderness (suprapubic tenderness). There is no rebound and no guarding. Hernia confirmed negative in the right inguinal area and confirmed negative in the left inguinal area.  Genitourinary: There is no rash, tenderness or lesion on the right labia. There is no rash, tenderness or lesion on the left labia. Uterus is tender. Uterus is not deviated, not enlarged and not fixed. Cervix exhibits motion tenderness. Cervix exhibits no discharge and no friability. Right adnexum displays tenderness. Right adnexum displays no mass and no fullness. Left adnexum displays tenderness. Left adnexum displays no mass and no fullness. No erythema or tenderness in the vagina. No foreign body in the vagina. No signs of injury around the vagina. No vaginal discharge found.  Lymphadenopathy:       Right: No inguinal adenopathy present.       Left: No inguinal adenopathy present.  Skin: Skin is warm and dry.  Psychiatric: She has a normal mood and affect. Her behavior is normal. Judgment and thought content normal.       Assessment & Plan:  1. Retained products of conception after miscarriage S/p d&E - Beta HCG, Quant  2. Language barrier, speaks Spanish only Interpreter used  3. Postpartum depression, postpartum condition Pt to see Jaime  4. Endometritis Cipro and  flagyl. F/u in 2 weeks.

## 2016-11-10 LAB — BETA HCG QUANT (REF LAB): hCG Quant: 2 m[IU]/mL

## 2016-11-13 ENCOUNTER — Ambulatory Visit: Payer: Self-pay | Admitting: Obstetrics & Gynecology

## 2016-11-30 ENCOUNTER — Ambulatory Visit (INDEPENDENT_AMBULATORY_CARE_PROVIDER_SITE_OTHER): Payer: Self-pay | Admitting: Family Medicine

## 2016-11-30 ENCOUNTER — Encounter: Payer: Self-pay | Admitting: Family Medicine

## 2016-11-30 ENCOUNTER — Ambulatory Visit: Payer: Self-pay

## 2016-11-30 VITALS — BP 105/70 | HR 77 | Ht 63.0 in | Wt 137.2 lb

## 2016-11-30 DIAGNOSIS — Z789 Other specified health status: Secondary | ICD-10-CM

## 2016-11-30 DIAGNOSIS — G8929 Other chronic pain: Secondary | ICD-10-CM

## 2016-11-30 DIAGNOSIS — O99345 Other mental disorders complicating the puerperium: Secondary | ICD-10-CM

## 2016-11-30 DIAGNOSIS — M545 Low back pain, unspecified: Secondary | ICD-10-CM

## 2016-11-30 DIAGNOSIS — F53 Postpartum depression: Secondary | ICD-10-CM

## 2016-11-30 MED ORDER — NAPROXEN 500 MG PO TABS: 500.0000 mg | ORAL_TABLET | Freq: Two times a day (BID) | ORAL | 1 refills | Status: DC

## 2016-11-30 NOTE — Progress Notes (Signed)
  Spanish interpreter: Video used  Subjective:    Patient ID: Rhonda Townsend is a 36 y.o. female presenting with Follow-up (Retained POC )  on 11/30/2016  HPI: Here today for f/u. Had missed ab with retained POC and had D and C with Dr. Adrian Townsend on 10/24/16. She is reporting improved pain after treatment for pp endometritis. She denies fever. Had bleeding that was likely a normal cycle. Reports back pain that is low in her back. It is constant and hurts when she sleeps. She has no recent injury or repetitive movements.   Review of Systems  Constitutional: Negative for chills and fever.  Respiratory: Negative for shortness of breath.   Cardiovascular: Negative for chest pain.  Gastrointestinal: Negative for abdominal pain, nausea and vomiting.  Genitourinary: Negative for dysuria.  Skin: Negative for rash.      Objective:    BP 105/70   Pulse 77   Ht 5\' 3"  (1.6 m)   Wt 137 lb 3.2 oz (62.2 kg)   BMI 24.30 kg/m  Physical Exam  Constitutional: She is oriented to person, place, and time. She appears well-developed and well-nourished. No distress.  HENT:  Head: Normocephalic and atraumatic.  Eyes: No scleral icterus.  Neck: Neck supple.  Cardiovascular: Normal rate.  Pulmonary/Chest: Effort normal.  Abdominal: Soft.  Neurological: She is alert and oriented to person, place, and time.  Skin: Skin is warm and dry.  Psychiatric: She has a normal mood and affect.        Assessment & Plan:   Problem List Items Addressed This Visit      Unprioritized   Postpartum depression, postpartum condition    Reports that her mood is good today      Language barrier, speaks Spanish only - Primary    Other Visit Diagnoses    Chronic bilateral low back pain without sciatica       back exercises and NSAIDS given   Relevant Medications   naproxen (NAPROSYN) 500 MG tablet     Resolved endometritis Total face-to-face time with patient: 15 minutes. Over 50% of encounter was spent on  counseling and coordination of care. Return if symptoms worsen or fail to improve.  Reva Boresanya S Brysyn Brandenberger 11/30/2016 3:09 PM

## 2016-11-30 NOTE — Patient Instructions (Addendum)
Ejercicios para la espalda (Back Exercises) Si tiene dolor de espalda, haga estos ejercicios 2 o 3veces por da, o como se lo haya indicado el mdico. Cuando el dolor desaparezca, hgalos una vez por da, pero haga ms repeticiones de cada ejercicio. Si no le duele la espalda, haga estos ejercicios una vez por da o como se lo haya indicado el mdico. EJERCICIOS Rodilla al pecho Repita estos pasos 3 o 5veces seguidas con cada pierna: 1. Acustese boca arriba sobre una cama dura o sobre el suelo con las piernas extendidas. 2. Lleve una rodilla al pecho. 3. Mantenga la rodilla contra el pecho. Para lograrlo tmese la rodilla o el muslo. 4. Tire de la rodilla hasta sentir una elongacin suave en la parte baja de la espalda. 5. Mantenga la elongacin durante 10 a 30segundos. 6. Suelte y extienda la pierna lentamente. Inclinacin de la pelvis Repita estos pasos 5 o 10veces seguidas: 1. Acustese boca arriba sobre una cama dura o sobre el suelo con las piernas extendidas. 2. Flexione las rodillas de manera que apunten al techo. Los pies deben estar apoyados en el suelo. 3. Contraiga los msculos de la parte baja del vientre (abdomen) para empujar la zona lumbar contra el suelo. Este movimiento har que el cccix apunte hacia el techo, en lugar de apuntar hacia abajo en direccin a los pies o al suelo. 4. Mantenga esta posicin durante 5 a 10segundos mientras contrae suavemente los msculos y respira con normalidad. El perro y el gato Repita estos pasos hasta que la zona lumbar se curve con ms facilidad: 1. Apoye las palmas de las manos y las rodillas sobre una superficie firme. Las manos deben estar alineadas con los hombros y las rodillas con las caderas. Puede colocarse almohadillas debajo de las rodillas. 2. Deje caer la cabeza y lleve el cccix hacia abajo de modo que apunte en direccin al suelo para que la zona lumbar se arquee como el lomo de un gato asustado. 3. Mantenga esta posicin  durante 5segundos. 4. Lentamente, levante la cabeza y lleve el cccix hacia arriba de modo que apunte en direccin al techo para que la espalda se arquee (hunda) como el lomo de un perro contento. 5. Mantenga esta posicin durante 5segundos. Flexiones de brazos Repita estos pasos 5 o 10veces seguidas: 1. Acustese boca abajo en el suelo. 2. Ponga las manos cerca de la cabeza, separadas aproximadamente al ancho de los hombros. 3. Con la espalda relajada y las caderas apoyadas en el suelo, extienda lentamente los brazos para levantar la mitad superior del cuerpo y elevar los hombros. No use los msculos de la espalda. Para estar ms cmodo, puede cambiar la ubicacin de las manos. 4. Mantenga esta posicin durante 5segundos. 5. Lentamente vuelva a la posicin horizontal. Puentes Repita estos pasos 10veces seguidas: 1. Acustese boca arriba sobre una superficie firme. 2. Flexione las rodillas de manera que apunten al techo. Los pies deben estar apoyados en el suelo. 3. Contraiga los glteos y despegue las nalgas del suelo hasta que la cintura est casi a la altura de las rodillas. Si no siente el trabajo muscular en las nalgas y la parte posterior de los muslos, aleje los pies 1 o 2pulgadas (2,5 o 5centmetros) de las nalgas. 4. Mantenga esta posicin durante 3 a 5segundos. 5. Lentamente, vuelva a apoyar las nalgas en el suelo y relaje los glteos. Si este ejercicio le resulta muy fcil, intente realizarlo con los brazos cruzados sobre el pecho. Abdominales Repita estos pasos 5 o   10veces seguidas: 1. Acustese boca arriba sobre una cama dura o sobre el suelo con las piernas extendidas. 2. Flexione las rodillas de manera que apunten al techo. Los pies deben estar apoyados en el suelo. 3. Cruce los World Fuel Services Corporationbrazos sobre el pecho. 4. Baje levemente el mentn en direccin al pecho, pero no doble el cuello. 5. Contraiga los msculos del abdomen y con lentitud eleve el pecho lo suficiente como para  despegar levemente los omplatos del suelo. 6. Lentamente baje el pecho y la cabeza hasta el suelo. Elevaciones de espalda Repita estos pasos 5 o 10veces seguidas: 1. Acustese boca abajo con los brazos a los costados y apoye la frente en el suelo. 2. Contraiga los msculos de las piernas y los glteos. 3. Lentamente despegue el pecho del suelo mientras mantiene las caderas apoyadas en el suelo. Mantenga la nuca alineada con la curvatura de la espalda. Mire hacia el suelo mientras hace este ejercicio. 4. Mantenga esta posicin durante 3 a 5segundos. 5. Lentamente baje el pecho y el rostro hasta el suelo. SOLICITE AYUDA SI:  El dolor de espalda se vuelve mucho ms intenso cuando hace un ejercicio.  El dolor de espalda no se [place]alivia 2horas despus de ARAMARK Corporationhacer los ejercicios. Si tiene alguno de Limited Brandsestos problemas, deje de ARAMARK Corporationhacer los ejercicios. No vuelva a hacer los ejercicios a menos que el mdico lo autorice. SOLICITE AYUDA DE INMEDIATO SI:  Siente un dolor sbito y muy intenso en la espalda. Si esto ocurre, deje de Toys 'R' Ushacer los ejercicios. No vuelva a hacer los ejercicios a menos que el mdico lo autorice. Esta informacin no tiene Theme park managercomo fin reemplazar el consejo del mdico. Asegrese de hacerle al mdico cualquier pregunta que tenga. Document Released: 04/26/2010 Document Revised: 05/03/2015 Document Reviewed: 03/05/2014 Elsevier Interactive Patient Education  2018 ArvinMer[employer]Elsevier Inc. Southern CompanyEleccin del mtodo anticonceptivo (Contraception Choices) La anticoncepcin (control de la natalidad) es el uso de cualquier mtodo o dispositivo para Location managerevitar el embarazo. A continuacin se indican algunos de esos mtodos. ANTICONCEPTIVOS HORMONALES  Un pequeo tubo colocado bajo la piel de la parte superior del brazo (implante). El tubo puede Geneticist, molecularpermanecer en el lugar durante 3 aos. El implante debe quitarse despus de 3 aos.  Inyecciones que se aplican cada 3 meses.  Pldoras que deben Yahoo! Inctomarse todos los  das.  Parches que se cambian una vez por semana.  Un anillo que se coloca en la vagina (anillo vaginal). El anillo se deja en su lugar durante 3 semanas y se retira durante 1 semana. Luego se coloca un nuevo anillo en la vagina.  Pldoras para el control de la natalidad despus de Warehouse managertener sexo (relaciones sexuales) sin proteccin.  ANTICONCEPTIVOS DE Emeline DarlingBARRERA  Una cubierta delgada que se Bots[place]ausa sobe el pene (condn masculino) que se coloca durante las relaciones sexuales.  Una cubierta blanda y suelta que se coloca en la vagina (condn femenino) antes de las relaciones sexuales.  Un dispositivo de goma que se aplica sobre el cuello del tero (diafragma). Este dispositivo debe ser hecho para usted. Se coloca en la vagina antes de Management consultanttener relaciones sexuales. Debe dejarlo colocado en la vagina durante 6 a 8 horas despus de las The St. Paul Travelersrelaciones sexuales.  Un capuchn pequeo y Du Pontsuave que se fija sobre el cuello del tero (capuchn cervical). Este capuchn debe ser hecho para usted. Debe dejarlo colocado en la vagina durante 48 horas despus de las The St. Paul Travelersrelaciones sexuales.  Una esponja que se coloca en la vagina antes de Management consultanttener relaciones sexuales.  Una sustancia qumica que destruye  o impide que los espermatozoides ingresen al cuello y al tero (espermicida). La sustancia qumica puede ser en crema, gel, espuma o pldoras.  DISPOSITIVO DE CONTROL INTRAUTERINO (DIU)  El DIU es un pequeo dispositivo plstico en forma de T. Se coloca dentro del tero. Hay dos tipos de DIU: ? DIU de cobre. El dispositivo est recubierto en alambre de cobre. El cobre produce un lquido que Federated Department Storesdestruye los espermatozoides. Puede permanecer colocado durante 10 aos. ? DIU hormonal. La hormona impide que ocurra el embarazo. Puede permanecer colocado durante 5 aos.  MTODOS PERMANENTES  La mujer puede hacerse sellar, ligar u obstruir las trompas de Falopio durante [place] Herz[place]inauna ciruga. Esto impide que el vulo llegue hasta el tero.  El  mdico coloca un alambre diminuto o lo inserta en cada una de las trompas de PillowFalopio. Esto produce un tejido cicatrizal que obstruye las trompas de GrantleyFalopio.  En el hombre pueden ligarse los conductos por los que pasan los espermatozoides (vasectoma).  CONTROL DE LA NATALIDAD POR PLANIFICACIN FAMILIAR NATURAL  La planificacin familiar natural significa no tener Clinical research associaterelaciones sexuales o usar un mtodo anticonceptivo de Warehouse managerbarrera en los perodos frtiles de la [place].  Use a un almanaque para llevar un registro de la extensin de cada perodo y para National Cityconocer los das en los que puede quedar Wayne Heightsembarazada.  Evite tener relaciones sexuales durante la ovulacin.  Use un termmetro para medir la Arts development officertemperatura corporal. Tambin reconozca los sntomas de la ovulacin.  El momento de EchoStartener relaciones sexuales debe ser despus de que la mujer [place]organtownhaya ovulado. Use condones para protegerse de las enfermedades de transmisin sexual (ETS). Hgalo independientemente del tipo de ToysRusanticonceptivo que use. Hable con su mdico acerca de cul es el mejor mtodo anticonceptivo para usted. Esta informacin no tiene Theme park managercomo fin reemplazar el consejo del mdico. Asegrese de hacerle al mdico cualquier pregunta que tenga. Document Released: 04/26/2010 Document Revised: 01/14/2013 Document Reviewed: 07/31/2012 Elsevier Interactive Patient Education  2017 ArvinMeritorElsevier Inc.

## 2016-11-30 NOTE — BH Specialist Note (Unsigned)
Integrated Behavioral Health Follow Up Visit  MRN: [id number] Name: Rhonda Townsend  Number of Integrated Behavioral Health Clinician visits: 2/6 Session Start time: ***  Session End time: *** Total time: {IBH Total Time:21014050}  Type of Service: Integrated Behavioral Health- Individual/Family Interpretor:Yes.   Interpretor Name and Language:***, Spanish   SUBJECTIVE: Rhonda Townsend is a 36 y.o. female accompanied by {Patient accompanied by:[id number]} Patient was referred by *** for ***. Patient reports the following symptoms/concerns: *** Duration of problem: ***; Severity of problem: {Mild/Moderate/Severe:20260}  OBJECTIVE: Mood: {BHH MOOD:22306} and Affect: {BHH AFFECT:22307} Risk of harm to self or others: {CHL AMB BH Suicide Current Mental Status:21022748}  LIFE CONTEXT: Family and Social: *** School/Work: *** Self-Care: *** Life Changes: ***  GOALS ADDRESSED: Patient will: 1.  Reduce symptoms of: {IBH Symptoms:21014056}  2.  Increase knowledge and/or ability of: {IBH Patient Tools:21014057}  3.  Demonstrate ability to: {IBH Goals:21014053}  INTERVENTIONS: Interventions utilized:  {IBH Interventions:21014054} Standardized Assessments completed: {IBH Screening Tools:21014051}  ASSESSMENT: Patient currently experiencing ***.   Patient may benefit from ***.  PLAN: 1. Follow up with behavioral health clinician on : *** 2. Behavioral recommendations: *** 3. Referral(s): {IBH Referrals:21014055} 4. "From scale of 1-10, how likely are you to follow plan?": ***  Jamie C McMannes, LCSWA

## 2016-11-30 NOTE — Progress Notes (Signed)
Here for follow up after miscarriage - still having severe pain and taking percocet. C/o whole body hurting for 2 weeks= 7 today. States she took all of the antibiotics.

## 2016-12-01 ENCOUNTER — Encounter: Payer: Self-pay | Admitting: Family Medicine

## 2016-12-01 NOTE — Assessment & Plan Note (Signed)
Reports that her mood is good today

## 2017-11-13 ENCOUNTER — Emergency Department (HOSPITAL_COMMUNITY)
Admission: EM | Admit: 2017-11-13 | Discharge: 2017-11-13 | Disposition: A | Discharge status: Home / Self Care | Payer: Self-pay | Attending: Emergency Medicine | Admitting: Emergency Medicine

## 2017-11-13 ENCOUNTER — Encounter (HOSPITAL_COMMUNITY): Payer: Self-pay | Admitting: Emergency Medicine

## 2017-11-13 ENCOUNTER — Emergency Department (HOSPITAL_COMMUNITY): Payer: Self-pay

## 2017-11-13 ENCOUNTER — Other Ambulatory Visit: Payer: Self-pay

## 2017-11-13 DIAGNOSIS — Y998 Other external cause status: Secondary | ICD-10-CM | POA: Insufficient documentation

## 2017-11-13 DIAGNOSIS — Y929 Unspecified place or not applicable: Secondary | ICD-10-CM | POA: Insufficient documentation

## 2017-11-13 DIAGNOSIS — Y9389 Activity, other specified: Secondary | ICD-10-CM | POA: Insufficient documentation

## 2017-11-13 DIAGNOSIS — S0990XA Unspecified injury of head, initial encounter: Secondary | ICD-10-CM | POA: Insufficient documentation

## 2017-11-13 DIAGNOSIS — I10 Essential (primary) hypertension: Secondary | ICD-10-CM | POA: Insufficient documentation

## 2017-11-13 MED ORDER — IBUPROFEN 800 MG PO TABS: 800.0000 mg | ORAL_TABLET | Freq: Three times a day (TID) | ORAL | 0 refills | Status: DC

## 2017-11-13 MED ORDER — KETOROLAC TROMETHAMINE 60 MG/2ML IM SOLN
60.0000 mg | Freq: Once | INTRAMUSCULAR | Status: AC
  Administered 2017-11-13: 60 mg via INTRAMUSCULAR
  Filled 2017-11-13: qty 2

## 2017-11-13 MED ORDER — OXYCODONE-ACETAMINOPHEN 5-325 MG PO TABS: 1.0000 | ORAL_TABLET | Freq: Three times a day (TID) | ORAL | 0 refills | Status: DC | PRN

## 2017-11-13 MED ORDER — OXYCODONE-ACETAMINOPHEN 5-325 MG PO TABS
2.0000 | ORAL_TABLET | Freq: Once | ORAL | Status: AC
  Administered 2017-11-13: 2 via ORAL
  Filled 2017-11-13: qty 2
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
## 2017-11-13 NOTE — ED Notes (Signed)
Quick look provider informed of patient
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
## 2017-11-14 ENCOUNTER — Encounter: Payer: Self-pay | Admitting: Family Medicine

## 2018-06-12 IMAGING — US US OB TRANSVAGINAL
1 series · 15 of 28 positions shown · non-contrast
Comparison: 09/28/2016

CLINICAL DATA: Recent miscarriage.  Positive beta HCG.

EXAM:
TRANSVAGINAL OB ULTRASOUND
TECHNIQUE: Transvaginal ultrasound was performed for complete evaluation of the
gestation as well as the maternal uterus, adnexal regions, and
pelvic cul-de-sac.

[Series 1: us ob transvaginal · 47 acquisitions, 15 frames shown]
[im 1/47]
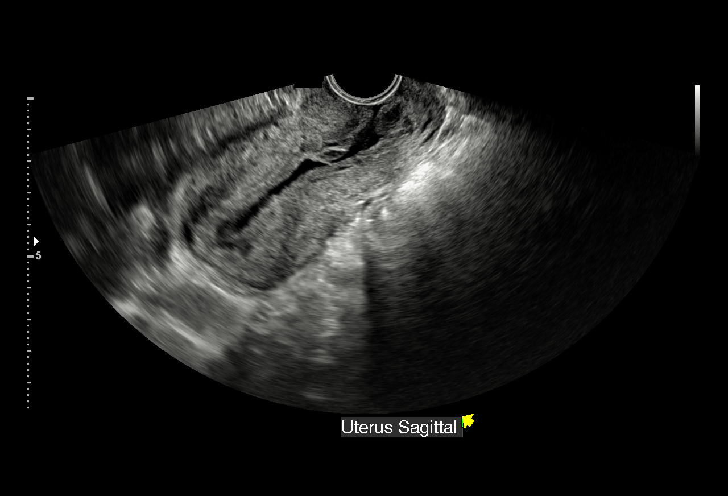
[im 4/47]
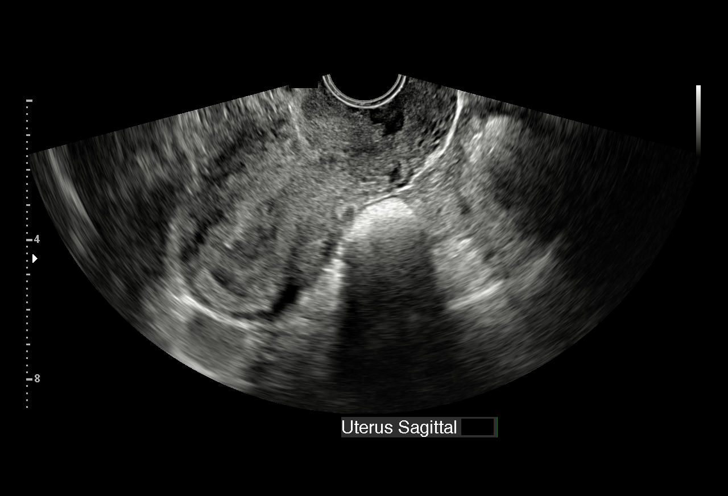
[im 7/47]
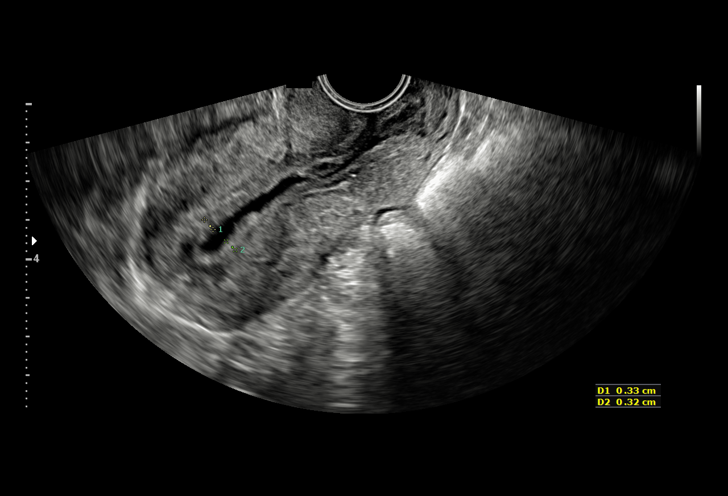
[im 11/47]
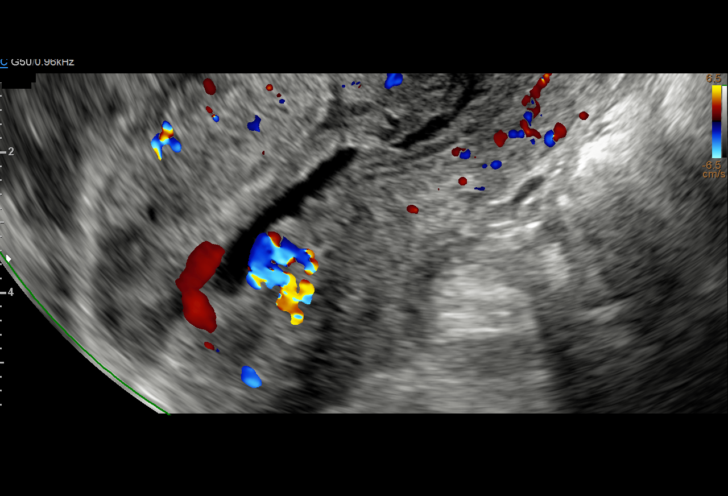
[im 14/47]
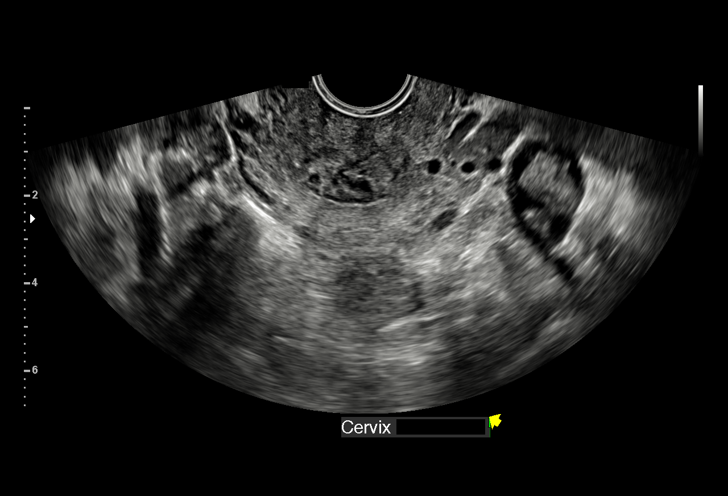
[im 18/47]
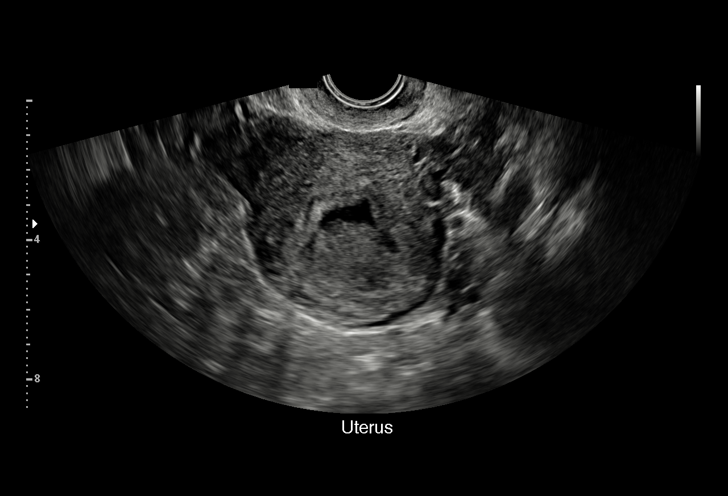
[im 21/47]
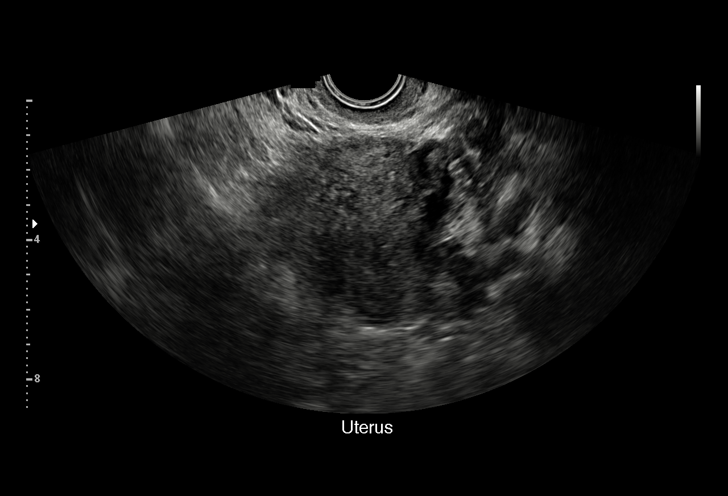
[im 24/47]
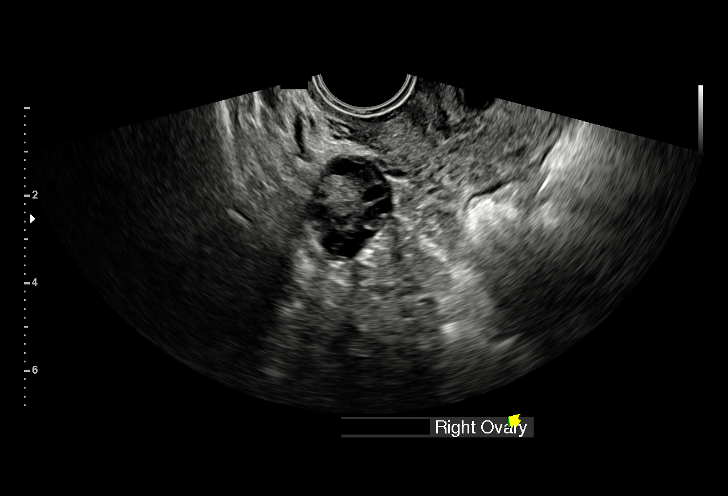
[im 26/47]
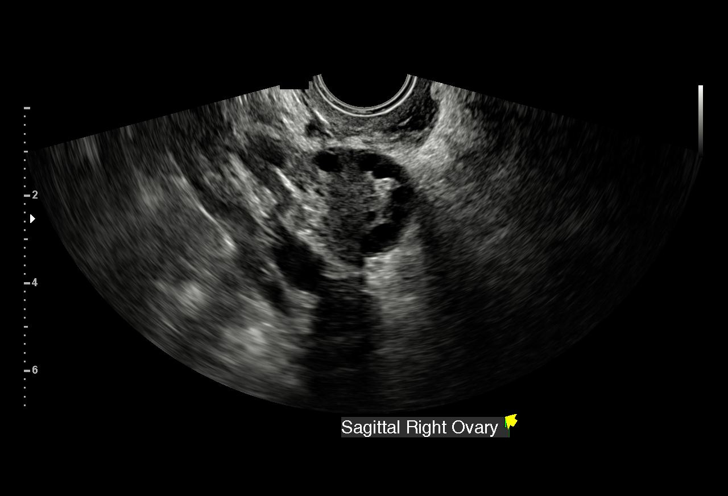
[im 29/47]
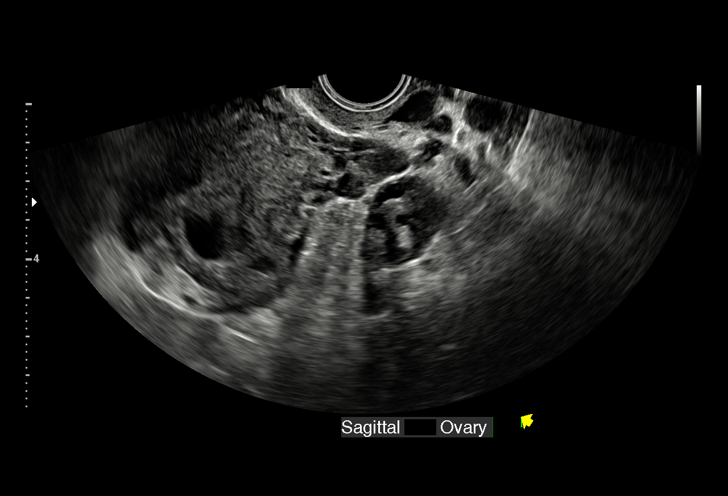
[im 33/47]
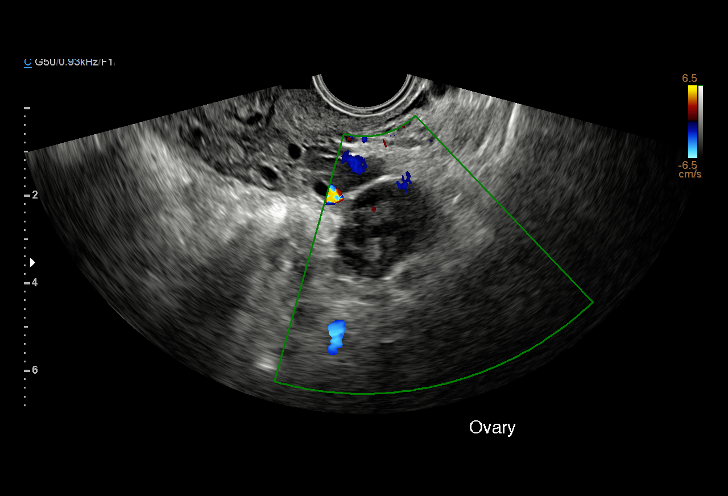
[im 36/47]
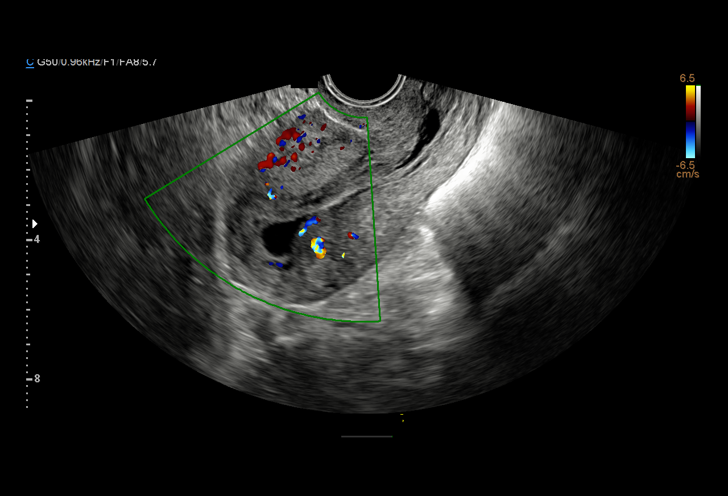
[im 40/47]
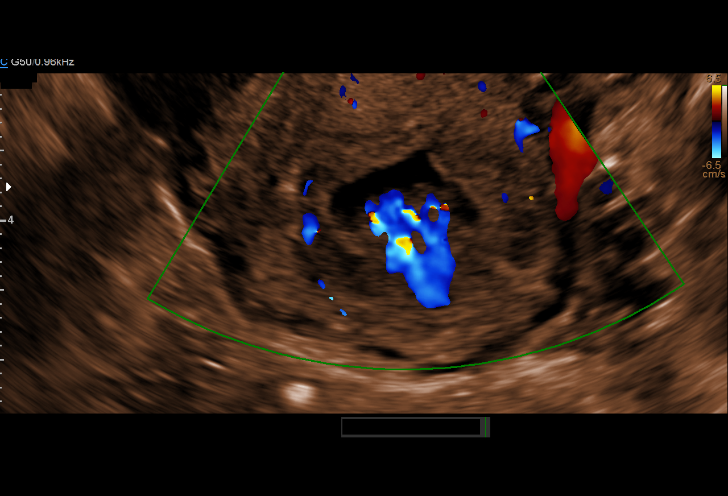
[im 43/47]
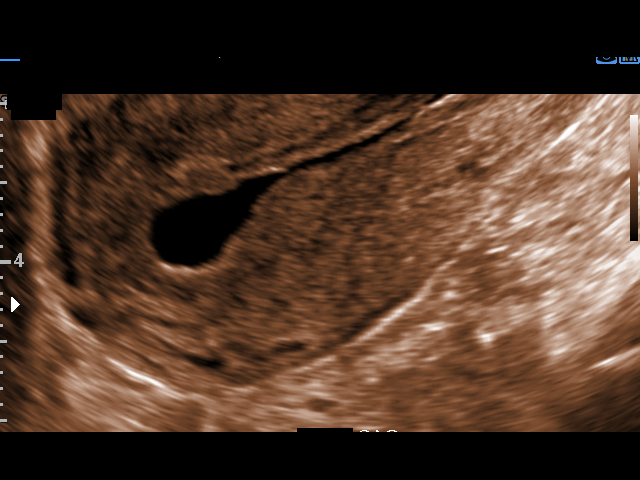
[im 47/47]
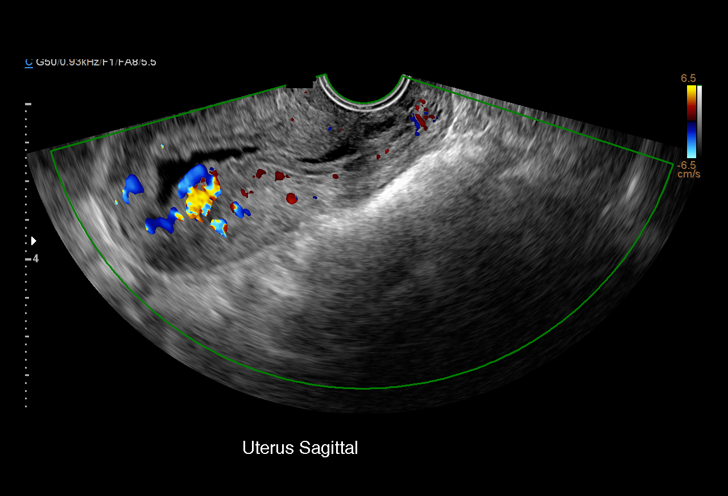

[15 of 28 positions shown; findings below may reference images not displayed]

FINDINGS: Intrauterine gestational sac: None

Maternal uterus/adnexae: A small amount of simple fluid is seen
within the endometrial cavity. This outlines a rounded masslike
density along the posterior wall of the fundal portion of the
endometrium which measures approximately 1.7 by 1.9 x 0.8 cm. This
shows hypervascularity on color Doppler ultrasound, suspicious for a
small amount of retained products of conception.

Both ovaries are normal in appearance. No adnexal mass or abnormal
free fluid identified.
IMPRESSION: 1.9 cm rounded masslike density along posterior wall of the fundal
portion of endometrial cavity, suspicious for retained products of
conception.

## 2018-10-26 ENCOUNTER — Emergency Department (HOSPITAL_COMMUNITY): Payer: Self-pay

## 2018-10-26 ENCOUNTER — Emergency Department (HOSPITAL_COMMUNITY)
Admission: EM | Admit: 2018-10-26 | Discharge: 2018-10-27 | Disposition: A | Discharge status: Home / Self Care | Payer: Self-pay | Attending: Emergency Medicine | Admitting: Emergency Medicine

## 2018-10-26 ENCOUNTER — Encounter (HOSPITAL_COMMUNITY): Payer: Self-pay

## 2018-10-26 ENCOUNTER — Other Ambulatory Visit: Payer: Self-pay

## 2018-10-26 DIAGNOSIS — Z79899 Other long term (current) drug therapy: Secondary | ICD-10-CM | POA: Insufficient documentation

## 2018-10-26 DIAGNOSIS — Y999 Unspecified external cause status: Secondary | ICD-10-CM | POA: Insufficient documentation

## 2018-10-26 DIAGNOSIS — Y9301 Activity, walking, marching and hiking: Secondary | ICD-10-CM | POA: Insufficient documentation

## 2018-10-26 DIAGNOSIS — S32009A Unspecified fracture of unspecified lumbar vertebra, initial encounter for closed fracture: Secondary | ICD-10-CM

## 2018-10-26 DIAGNOSIS — S32040A Wedge compression fracture of fourth lumbar vertebra, initial encounter for closed fracture: Secondary | ICD-10-CM | POA: Insufficient documentation

## 2018-10-26 DIAGNOSIS — R4182 Altered mental status, unspecified: Secondary | ICD-10-CM | POA: Insufficient documentation

## 2018-10-26 DIAGNOSIS — Y92009 Unspecified place in unspecified non-institutional (private) residence as the place of occurrence of the external cause: Secondary | ICD-10-CM | POA: Insufficient documentation

## 2018-10-26 DIAGNOSIS — W109XXA Fall (on) (from) unspecified stairs and steps, initial encounter: Secondary | ICD-10-CM | POA: Insufficient documentation

## 2018-10-26 DIAGNOSIS — W19XXXA Unspecified fall, initial encounter: Secondary | ICD-10-CM

## 2018-10-26 DIAGNOSIS — S0990XA Unspecified injury of head, initial encounter: Secondary | ICD-10-CM | POA: Insufficient documentation

## 2018-10-26 LAB — BASIC METABOLIC PANEL
Anion gap: 11 (ref 5–15)
BUN: 10 mg/dL (ref 6–20)
CO2: 21 mmol/L — ABNORMAL LOW (ref 22–32)
Calcium: 9.1 mg/dL (ref 8.9–10.3)
Chloride: 104 mmol/L (ref 98–111)
Creatinine, Ser: 0.81 mg/dL (ref 0.44–1.00)
GFR calc Af Amer: >60 mL/min (ref >60)
GFR calc non Af Amer: >60 mL/min (ref >60)
Glucose, Bld: 90 mg/dL (ref 70–99)
Potassium: 3.7 mmol/L (ref 3.5–5.1)
Sodium: 136 mmol/L (ref 135–145)

## 2018-10-26 LAB — CBC WITH DIFFERENTIAL/PLATELET
Abs Immature Granulocytes: 0.02 10*3/uL (ref 0.00–0.07)
Basophils Absolute: 0 10*3/uL (ref 0.0–0.1)
Basophils Relative: 1 %
Eosinophils Absolute: 0.1 10*3/uL (ref 0.0–0.5)
Eosinophils Relative: 2 %
HCT: 38.9 % (ref 36.0–46.0)
Hemoglobin: 12.5 g/dL (ref 12.0–15.0)
Immature Granulocytes: 0 %
Lymphocytes Relative: 21 %
Lymphs Abs: 1.4 10*3/uL (ref 0.7–4.0)
MCH: 27.6 pg (ref 26.0–34.0)
MCHC: 32.1 g/dL (ref 30.0–36.0)
MCV: 85.9 fL (ref 80.0–100.0)
Monocytes Absolute: 0.4 10*3/uL (ref 0.1–1.0)
Monocytes Relative: 6 %
Neutro Abs: 4.6 10*3/uL (ref 1.7–7.7)
Neutrophils Relative %: 70 %
Platelets: 221 10*3/uL (ref 150–400)
RBC: 4.53 MIL/uL (ref 3.87–5.11)
RDW: 15 % (ref 11.5–15.5)
WBC: 6.6 10*3/uL (ref 4.0–10.5)
nRBC: 0 % (ref 0.0–0.2)

## 2018-10-26 LAB — I-STAT BETA HCG BLOOD, ED (MC, WL, AP ONLY): I-stat hCG, quantitative: <5 m[IU]/mL (ref <5)

## 2018-10-26 MED ORDER — METHOCARBAMOL 500 MG PO TABS: 500.0000 mg | ORAL_TABLET | Freq: Two times a day (BID) | ORAL | 0 refills | Status: DC

## 2018-10-26 MED ORDER — OXYCODONE-ACETAMINOPHEN 5-325 MG PO TABS
1.0000 | ORAL_TABLET | Freq: Once | ORAL | Status: AC
  Administered 2018-10-26: 23:00:00 1 via ORAL
  Filled 2018-10-26: qty 1

## 2018-10-26 MED ORDER — OXYCODONE-ACETAMINOPHEN 5-325 MG PO TABS: 1.0000 | ORAL_TABLET | Freq: Four times a day (QID) | ORAL | 0 refills | Status: DC | PRN

## 2018-10-26 MED ORDER — OXYCODONE-ACETAMINOPHEN 5-325 MG PO TABS: 1.0000 | ORAL_TABLET | Freq: Four times a day (QID) | ORAL | 0 refills | Status: AC | PRN

## 2018-10-26 MED ORDER — FENTANYL CITRATE (PF) 100 MCG/2ML IJ SOLN
50.0000 ug | Freq: Once | INTRAMUSCULAR | Status: AC
  Administered 2018-10-26: 20:00:00 50 ug via INTRAVENOUS
  Filled 2018-10-26: qty 2

## 2018-10-26 NOTE — ED Notes (Signed)
Patient transported to CT 

## 2018-10-26 NOTE — ED Provider Notes (Signed)
[facility] EMERGENCY DEPARTMENT Provider Note   CSN: [id number] Arrival date & time: 10/26/18  1851     History   Chief Complaint Chief Complaint  Patient presents with   Fall    HPI Rhonda Townsend is a 38 y.o. female who presents to ED after a fall that occurred prior to arrival.  Majority of history is provided by her friend at the bedside.  Patient declined medical interpreter.  States that she was carrying a large basket of laundry with several close hanging off of the side of it.  She was starting to walk down the stairs when she fell all the way to the bottom.  Patient states that everything "went black" for several seconds.  Her friend states that she found the patient immediately, less than 30 seconds later and was conscious.  She is complaining of headache, neck pain and back pain.  Reports pins and needle sensation to her left leg.  She has not tried ambulating since the fall.  She denies any vision changes, numbness in arms or legs, loss of bowel or bladder function, chest pain, abdominal pain, vomiting, anticoagulant use.     HPI  Past Medical History:  Diagnosis Date   Blood transfusion without reported diagnosis    pp hemorrhage   Medical history non-contributory    Tachycardia 2014    Patient Active Problem List   Diagnosis Date Noted   Language barrier, speaks Spanish only 03/11/2014   Postpartum depression, postpartum condition 07/17/2012    Past Surgical History:  Procedure Laterality Date   CESAREAN SECTION     CESAREAN SECTION     2003 (1st preg in [place])   DILATION AND EVACUATION N/A 10/24/2016   Procedure: DILATATION AND EVACUATION;  Surgeon: Levie Heritage, DO;  Location: [facility];  Service: Gynecology;  Laterality: N/A;     OB History    Gravida  6   Para  6   Term  5   Preterm  1   AB  0   Living  6     SAB  0   TAB  0   Ectopic  0   Multiple      Live Births  6             Home Medications    Prior to Admission medications   Medication Sig Start Date End Date Taking? Authorizing Provider  ibuprofen (ADVIL,MOTRIN) 800 MG tablet Take 1 tablet (800 mg total) by mouth 3 (three) times daily. 11/13/17   Mesner, Barbara Cower, MD  methocarbamol (ROBAXIN) 500 MG tablet Take 1 tablet (500 mg total) by mouth 2 (two) times daily. 10/26/18   Janyth Riera, PA-C  naproxen (NAPROSYN) 500 MG tablet Take 1 tablet (500 mg total) 2 (two) times daily with a meal by mouth. 11/30/16   Reva Bores, MD  ondansetron (ZOFRAN ODT) 4 MG disintegrating tablet Take 1 tablet (4 mg total) by mouth every 8 (eight) hours as needed for nausea or vomiting. Patient not taking: Reported on 11/09/2016 08/14/16   Elpidio Anis, PA-C  oxyCODONE-acetaminophen (PERCOCET) 5-325 MG tablet Take 1 tablet by mouth every 8 (eight) hours as needed for severe pain. 11/13/17   Mesner, Barbara Cower, MD  oxyCODONE-acetaminophen (PERCOCET/ROXICET) 5-325 MG tablet Take 1-2 tablets by mouth every 6 (six) hours as needed. 10/24/16   Levie Heritage, DO    Family History Family History  Problem Relation Age of Onset   Alcohol abuse Neg Hx  Arthritis Neg Hx    Asthma Neg Hx    Birth defects Neg Hx    Cancer Neg Hx    COPD Neg Hx    Depression Neg Hx    Diabetes Neg Hx    Drug abuse Neg Hx    Early death Neg Hx    Hearing loss Neg Hx    Heart disease Neg Hx    Hypertension Neg Hx    Hyperlipidemia Neg Hx    Kidney disease Neg Hx    Learning disabilities Neg Hx    Mental illness Neg Hx    Mental retardation Neg Hx    Miscarriages / Stillbirths Neg Hx    Stroke Neg Hx    Vision loss Neg Hx     Social History Social History   Tobacco Use   Smoking status: Never Smoker   Smokeless tobacco: Never Used  Substance Use Topics   Alcohol use: Not Currently    Frequency: Never   Drug use: Never     Allergies   Patient has no known allergies.   Review of Systems Review of Systems   Constitutional: Negative for appetite change, chills and fever.  HENT: Negative for ear pain, rhinorrhea, sneezing and sore throat.   Eyes: Negative for photophobia and visual disturbance.  Respiratory: Negative for cough, chest tightness, shortness of breath and wheezing.   Cardiovascular: Negative for chest pain and palpitations.  Gastrointestinal: Negative for abdominal pain, blood in stool, constipation, diarrhea, nausea and vomiting.  Genitourinary: Negative for dysuria, hematuria and urgency.  Musculoskeletal: Positive for back pain and myalgias.  Skin: Negative for rash.  Neurological: Positive for headaches. Negative for dizziness, weakness and light-headedness.     Physical Exam Updated Vital Signs BP 113/80    Pulse 62    Temp 98.4 F (36.9 C) (Oral)    Resp 15    Ht  (1.6 m)    Wt 61.2 kg    SpO2 100%    BMI 23.91 kg/m   Physical Exam Vitals signs and nursing note reviewed.  Constitutional:      General: She is not in acute distress.    Appearance: She is well-developed.  HENT:     Head: Normocephalic and atraumatic.     Nose: Nose normal.  Eyes:     General: No scleral icterus.       Left eye: No discharge.     Conjunctiva/sclera: Conjunctivae normal.  Neck:     Musculoskeletal: Normal range of motion and neck supple.     Comments: C-collar in place. Cardiovascular:     Rate and Rhythm: Normal rate and regular rhythm.     Heart sounds: Normal heart sounds. No murmur. No friction rub. No gallop.   Pulmonary:     Effort: Pulmonary effort is normal. No respiratory distress.     Breath sounds: Normal breath sounds.  Abdominal:     General: Bowel sounds are normal. There is no distension.     Palpations: Abdomen is soft.     Tenderness: There is no abdominal tenderness. There is no guarding.  Musculoskeletal: Normal range of motion.       Back:     Comments: Tenderness to palpation of the lumbar spine at the midline and paraspinal musculature bilaterally.   Sensation intact to light touch of bilateral lower extremities.  Able to lift bilateral legs but pain associate in her back with this.  2+ DP pulses palpated bilaterally.  No step-off palpated.  Skin:    General: Skin is warm and dry.     Findings: No rash.  Neurological:     General: No focal deficit present.     Mental Status: She is alert and oriented to person, place, and time.     Cranial Nerves: No cranial nerve deficit.     Sensory: No sensory deficit.     Motor: No weakness or abnormal muscle tone.     Coordination: Coordination normal.     Comments: Pupils reactive. No facial asymmetry noted. Cranial nerves appear grossly intact. Sensation intact to light touch on face, BUE and BLE. Strength 5/5 in BUE and BLE.       ED Treatments / Results  Labs (all labs ordered are listed, but only abnormal results are displayed) Labs Reviewed  CBC WITH DIFFERENTIAL/PLATELET  BASIC METABOLIC PANEL  I-STAT BETA HCG BLOOD, ED ([facility], [facility], [facility] ONLY)    EKG None  Radiology Ct Head Wo Contrast  Result Date: 10/26/2018 CLINICAL DATA:  38 year old female with history of altered level of consciousness. History of trauma from a fall backwards. EXAM: CT HEAD WITHOUT CONTRAST CT CERVICAL SPINE WITHOUT CONTRAST TECHNIQUE: Multidetector CT imaging of the head and cervical spine was performed following the standard protocol without intravenous contrast. Multiplanar CT image reconstructions of the cervical spine were also generated. COMPARISON:  Head CT and cervical spine CT 05/22/2010. FINDINGS: CT HEAD FINDINGS Brain: No evidence of acute infarction, hemorrhage, hydrocephalus, extra-axial collection or mass lesion/mass effect. Vascular: No hyperdense vessel or unexpected calcification. Skull: Normal. Negative for fracture or focal lesion. Sinuses/Orbits: No acute finding. Other: None. CT CERVICAL SPINE FINDINGS Alignment: Normal. Skull base and vertebrae: No acute fracture. No primary bone lesion or focal  pathologic process. Soft tissues and spinal canal: No prevertebral fluid or swelling. No visible canal hematoma. Disc levels: No significant degenerative disc disease or facet arthropathy. Upper chest: Negative. Other: None. IMPRESSION: 1. No signs of significant acute traumatic injury to the skull, brain or cervical spine. 2. The appearance of the brain is normal. Electronically Signed   By: Trudie Reed M.D.   On: 10/26/2018 20:38   Ct Cervical Spine Wo Contrast  Result Date: 10/26/2018 CLINICAL DATA:  38 year old female with history of altered level of consciousness. History of trauma from a fall backwards. EXAM: CT HEAD WITHOUT CONTRAST CT CERVICAL SPINE WITHOUT CONTRAST TECHNIQUE: Multidetector CT imaging of the head and cervical spine was performed following the standard protocol without intravenous contrast. Multiplanar CT image reconstructions of the cervical spine were also generated. COMPARISON:  Head CT and cervical spine CT 05/22/2010. FINDINGS: CT HEAD FINDINGS Brain: No evidence of acute infarction, hemorrhage, hydrocephalus, extra-axial collection or mass lesion/mass effect. Vascular: No hyperdense vessel or unexpected calcification. Skull: Normal. Negative for fracture or focal lesion. Sinuses/Orbits: No acute finding. Other: None. CT CERVICAL SPINE FINDINGS Alignment: Normal. Skull base and vertebrae: No acute fracture. No primary bone lesion or focal pathologic process. Soft tissues and spinal canal: No prevertebral fluid or swelling. No visible canal hematoma. Disc levels: No significant degenerative disc disease or facet arthropathy. Upper chest: Negative. Other: None. IMPRESSION: 1. No signs of significant acute traumatic injury to the skull, brain or cervical spine. 2. The appearance of the brain is normal. Electronically Signed   By: Trudie Reed M.D.   On: 10/26/2018 20:38   Dg Pelvis Portable  Result Date: 10/26/2018 CLINICAL DATA:  Fall EXAM: PORTABLE PELVIS 1-2 VIEWS  COMPARISON:  None. FINDINGS: There is no evidence of  pelvic fracture or diastasis. No pelvic bone lesions are seen. IMPRESSION: Negative. Electronically Signed   By: Jonna Clark M.D.   On: 10/26/2018 21:05   Dg Chest Portable 1 View  Result Date: 10/26/2018 CLINICAL DATA:  Fall EXAM: PORTABLE CHEST 1 VIEW COMPARISON:  November 18, 2008 FINDINGS: The heart size and mediastinal contours are within normal limits. Both lungs are clear. The visualized skeletal structures are unremarkable. IMPRESSION: No acute cardiopulmonary process. Electronically Signed   By: Jonna Clark M.D.   On: 10/26/2018 21:05    Procedures Procedures (including critical care time)  Medications Ordered in ED Medications  fentaNYL (SUBLIMAZE) injection 50 mcg (50 mcg Intravenous Given 10/26/18 2002)     Initial Impression / Assessment and Plan / ED Course  I have reviewed the triage vital signs and the nursing notes.  Pertinent labs & imaging results that were available during my care of the patient were reviewed by me and considered in my medical decision making (see chart for details).  Clinical Course as of Oct 25 2133  Sat Oct 26, 2018  2106 Patient seen by myself as well as PA provider.  Briefly is a 38 year old female no significant past medical history presents with mechanical fall downstairs.  Her roommate is present at bedside and helps with interpretation.  She reports that the patient was carrying a heavy load of boxes and was referring to the top of the steps and fell backwards.  The patient was able to confirm this mass compartment.  Patient reports she fell down approximately 10-12 stairs.  There is no loss of consciousness.  She was reporting pain in the back of her head and her neck.  On my exam the patient appears comfortable but has received IV pain medications.  There are no obvious deformities or signs of basilar skull fracture.  She has no deformities of the extremities.  She can range all of her  extremities.  She has no chest wall tenderness or abdominal tenderness.  Her CT scan of the brain and C-spine was negative.  She is pending a trauma x-ray of the chest and pelvis as well as a CT of the L-spine she does demonstrate some midline tenderness.  This CT was initially delayed due to a delay in obtaining an hCG level of her pregnancy status.  If her images are negative, we will discharge her home.   [MT]    Clinical Course User Index [MT] Trifan, Kermit Balo, MD       38 year old female presents to ED after fall down the stairs.  This appears to be mechanical fall because she was carrying a large basket of laundry with several close draped over it and did not see the steps.  It appears that she fell down a whole set of stairs.  She states that everything "went black" for a few seconds but her friend found her less than 30 seconds later and was fully conscious.  Patient complaining of headache, neck pain and back pain.  She is not her ambulating since then.  Tenderness palpation of the lumbar spine at the midline.  No deficits neurological exam noted.  She is somewhat slow to answer but is alert and oriented x4.  Sensation intact light touch of bilateral upper and lower extremities.  She is not currently anticoagulated.  Will obtain lab work and CT imaging, x-rays and reassess.  Denies possibility of pregnancy.  9:34 PM CT of the head and cervical spine is unremarkable.  Chest x-ray  and pelvic x-ray is negative.  CT of the L-spine pending.  Patient with significant improvement in her symptoms with pain medication given.  Anticipate discharge home if the CT of the L-spine is unremarkable.  Handed off to oncoming provider.  Final Clinical Impressions(s) / ED Diagnoses   Final diagnoses:  Fall in home, initial encounter  Injury of head, initial encounter    ED Discharge Orders         Ordered    methocarbamol (ROBAXIN) 500 MG tablet  2 times daily     10/26/18 2133             Delia Heady, PA-C 10/26/18 2135    Wyvonnia Dusky, MD 10/26/18 2356

## 2018-10-26 NOTE — Discharge Instructions (Addendum)
Use a heating pad, take muscle relaxer as directed. Return to the ED if you start to experience worsening headache, blurry vision, numbness in arms or legs or subsequent injury.

## 2018-10-26 NOTE — ED Provider Notes (Signed)
11:44 PM Sign out from [place] PA-C at shift change.   Pt here after fall down stairs. Non-accidental trauma/safety screening questions negative per RN.   Pending L-spine CT, I reviewed. Shows small L4 spinous process fx.   Pt updated. Will add rx for Percocet. F/u with spine for fx. Referral given.   Discussed imaging with Dr. Sherry Ruffing.   D/c home.   BP 113/80   Pulse 62   Temp 98.4 F (36.9 C) (Oral)   Resp 15   Ht 5\' 3"  (1.6 m)   Wt 61.2 kg   SpO2 100%   BMI 23.91 kg/m     Carlisle Cater, PA-C 10/26/18 2347    Tegeler, Gwenyth Allegra, MD 10/27/18 [phone]

## 2018-10-26 NOTE — ED Triage Notes (Signed)
Pt from home via ems; Pt carrying laundry down stairs, walking down L shaped staircase; mechanical fall backwards on to back, approx down 12-14 stairs; hit head and "everything went black"; not on thinners; c/o neck pain and head pain; c/o lower back pain with leg movement, pins and needles to L leg; no obvious deformities noted by ems  102/64 HR 60 100% RA T 98.38F

## 2018-10-27 MED ORDER — METHOCARBAMOL 500 MG PO TABS: 500.0000 mg | ORAL_TABLET | Freq: Two times a day (BID) | ORAL | 0 refills | Status: AC

## 2019-06-29 ENCOUNTER — Emergency Department (HOSPITAL_COMMUNITY): Payer: Self-pay

## 2019-06-29 ENCOUNTER — Encounter (HOSPITAL_COMMUNITY): Payer: Self-pay | Admitting: Emergency Medicine

## 2019-06-29 ENCOUNTER — Emergency Department (HOSPITAL_COMMUNITY)
Admission: EM | Admit: 2019-06-29 | Discharge: 2019-06-29 | Disposition: A | Discharge status: Home / Self Care | Payer: Self-pay | Attending: Emergency Medicine | Admitting: Emergency Medicine

## 2019-06-29 ENCOUNTER — Other Ambulatory Visit: Payer: Self-pay

## 2019-06-29 DIAGNOSIS — R0789 Other chest pain: Secondary | ICD-10-CM | POA: Insufficient documentation

## 2019-06-29 LAB — BASIC METABOLIC PANEL
Anion gap: 9 (ref 5–15)
BUN: 10 mg/dL (ref 6–20)
CO2: 22 mmol/L (ref 22–32)
Calcium: 8.7 mg/dL — ABNORMAL LOW (ref 8.9–10.3)
Chloride: 106 mmol/L (ref 98–111)
Creatinine, Ser: 0.87 mg/dL (ref 0.44–1.00)
GFR calc Af Amer: >60 mL/min (ref >60)
GFR calc non Af Amer: >60 mL/min (ref >60)
Glucose, Bld: 102 mg/dL — ABNORMAL HIGH (ref 70–99)
Potassium: 4 mmol/L (ref 3.5–5.1)
Sodium: 137 mmol/L (ref 135–145)

## 2019-06-29 LAB — CBC
HCT: 40.4 % (ref 36.0–46.0)
Hemoglobin: 13.2 g/dL (ref 12.0–15.0)
MCH: 29.6 pg (ref 26.0–34.0)
MCHC: 32.7 g/dL (ref 30.0–36.0)
MCV: 90.6 fL (ref 80.0–100.0)
Platelets: 193 10*3/uL (ref 150–400)
RBC: 4.46 MIL/uL (ref 3.87–5.11)
RDW: 12.6 % (ref 11.5–15.5)
WBC: 6 10*3/uL (ref 4.0–10.5)
nRBC: 0 % (ref 0.0–0.2)

## 2019-06-29 LAB — I-STAT BETA HCG BLOOD, ED (MC, WL, AP ONLY): I-stat hCG, quantitative: <5 m[IU]/mL (ref <5)

## 2019-06-29 LAB — TROPONIN I (HIGH SENSITIVITY): Troponin I (High Sensitivity): <2 ng/L (ref <18)

## 2019-06-29 MED ORDER — SODIUM CHLORIDE 0.9% FLUSH: 3.0000 mL | Freq: Once | INTRAVENOUS | Status: DC

## 2019-06-29 MED ORDER — NAPROXEN 500 MG PO TABS: 500.0000 mg | ORAL_TABLET | Freq: Two times a day (BID) | ORAL | 0 refills | Status: AC

## 2019-06-29 MED ORDER — KETOROLAC TROMETHAMINE 30 MG/ML IJ SOLN
30.0000 mg | Freq: Once | INTRAMUSCULAR | Status: AC
  Administered 2019-06-29: 30 mg via INTRAMUSCULAR
  Filled 2019-06-29: qty 1

## 2019-06-29 NOTE — ED Triage Notes (Signed)
C/o R sided chest pain that started 40 min ago while pulling her pants up.  Denies SOB, nausea, and vomiting.

## 2019-06-29 NOTE — ED Provider Notes (Signed)
[facility] EMERGENCY DEPARTMENT Provider Note   CSN: [id number] Arrival date & time: 06/29/19  1341     History Chief Complaint  Patient presents with  . Chest Pain    Rhonda Townsend is a 39 y.o. female.  40 y.o female with a PMH of Anxiety, presents to the ED with a chief complaint of right sided chest pani x 2 hours. No prior history of CAD, blood clots, family hx of CAD.No fever, no cough. Pain began while attempting to pull up her pants. Tylenol taken without relief.   The history is provided by the patient and medical records.  Chest Pain Pain location:  R chest Pain quality: aching   Pain radiates to:  Does not radiate Pain severity:  Mild Onset quality:  Sudden Duration:  2 hours Timing:  Constant Progression:  Unchanged Chronicity:  New Context: lifting and movement   Context: not breathing and not at rest   Relieved by:  Nothing Worsened by:  Movement and deep breathing Ineffective treatments:  Rest Associated symptoms: no back pain, no cough, no fatigue, no fever, no headache, no nausea and no shortness of breath   Risk factors: no coronary artery disease, no diabetes mellitus, no hypertension, not female, no prior DVT/PE, no smoking and no surgery        Past Medical History:  Diagnosis Date  . Blood transfusion without reported diagnosis    pp hemorrhage  . Medical history non-contributory   . Tachycardia 2014    Patient Active Problem List   Diagnosis Date Noted  . Language barrier, speaks Spanish only 03/11/2014  . Postpartum depression, postpartum condition 07/17/2012    Past Surgical History:  Procedure Laterality Date  . CESAREAN SECTION    . CESAREAN SECTION     2003 (1st preg in [place])  . DILATION AND EVACUATION N/A 10/24/2016   Procedure: DILATATION AND EVACUATION;  Surgeon: Levie Heritage, DO;  Location: [facility];  Service: Gynecology;  Laterality: N/A;     OB History    Gravida  6   Para  6   Term    5   Preterm  1   AB  0   Living  6     SAB  0   TAB  0   Ectopic  0   Multiple      Live Births  6           Family History  Problem Relation Age of Onset  . Alcohol abuse Neg Hx   . Arthritis Neg Hx   . Asthma Neg Hx   . Birth defects Neg Hx   . Cancer Neg Hx   . COPD Neg Hx   . Depression Neg Hx   . Diabetes Neg Hx   . Drug abuse Neg Hx   . Early death Neg Hx   . Hearing loss Neg Hx   . Heart disease Neg Hx   . Hypertension Neg Hx   . Hyperlipidemia Neg Hx   . Kidney disease Neg Hx   . Learning disabilities Neg Hx   . Mental illness Neg Hx   . Mental retardation Neg Hx   . Miscarriages / Stillbirths Neg Hx   . Stroke Neg Hx   . Vision loss Neg Hx     Social History   Tobacco Use  . Smoking status: Never Smoker  . Smokeless tobacco: Never Used  Substance Use Topics  . Alcohol use: Not Currently  .  Drug use: Never    Home Medications Prior to Admission medications   Medication Sig Start Date End Date Taking? Authorizing Provider  methocarbamol (ROBAXIN) 500 MG tablet Take 1 tablet (500 mg total) by mouth 2 (two) times daily. 10/27/18   Renne Crigler, PA-C  naproxen (NAPROSYN) 500 MG tablet Take 1 tablet (500 mg total) by mouth 2 (two) times daily for 7 days. 06/29/19 07/06/19  Claude Manges, PA-C  oxyCODONE-acetaminophen (PERCOCET/ROXICET) 5-325 MG tablet Take 1 tablet by mouth every 6 (six) hours as needed for severe pain. 10/26/18   Renne Crigler, PA-C    Allergies    Patient has no known allergies.  Review of Systems   Review of Systems  Constitutional: Negative for fatigue and fever.  HENT: Negative for sore throat.   Respiratory: Negative for cough and shortness of breath.   Cardiovascular: Positive for chest pain. Negative for leg swelling.  Gastrointestinal: Negative for anal bleeding and nausea.  Genitourinary: Negative for flank pain.  Musculoskeletal: Negative for back pain.  Skin: Negative for pallor and wound.  Neurological:  Negative for light-headedness and headaches.  All other systems reviewed and are negative.   Physical Exam Updated Vital Signs BP 106/77   Pulse 61   Temp 98.3 F (36.8 C) (Oral)   Resp 18   LMP 05/30/2019   SpO2 100%   Physical Exam Vitals and nursing note reviewed.  Constitutional:      Appearance: She is well-developed. She is not ill-appearing or toxic-appearing.  HENT:     Head: Normocephalic and atraumatic.  Cardiovascular:     Rate and Rhythm: Normal rate.     Pulses:          Radial pulses are 2+ on the right side and 2+ on the left side.     Heart sounds: Normal heart sounds.  Pulmonary:     Effort: Pulmonary effort is normal. No tachypnea.     Breath sounds: No decreased breath sounds, wheezing or rhonchi.     Comments: Lungs are clear to auscultation, no wheezing, rhonchi, or rales.  Chest:     Chest wall: Tenderness present. No mass, lacerations, deformity or crepitus.       Comments: TTP with palpation of the right pectoral muscle, exacerbated with lifting of her right arm. No crepitus or bruising noted.  Abdominal:     Palpations: Abdomen is soft.     Tenderness: There is no abdominal tenderness.  Musculoskeletal:     Cervical back: Normal range of motion and neck supple.  Skin:    General: Skin is warm and dry.  Neurological:     Mental Status: She is alert and oriented to person, place, and time.     ED Results / Procedures / Treatments   Labs (all labs ordered are listed, but only abnormal results are displayed) Labs Reviewed  BASIC METABOLIC PANEL - Abnormal; Notable for the following components:      Result Value   Glucose, Bld 102 (*)    Calcium 8.7 (*)    All other components within normal limits  CBC  I-STAT BETA HCG BLOOD, ED ([facility], [facility], [facility] ONLY)  TROPONIN I (HIGH SENSITIVITY)  TROPONIN I (HIGH SENSITIVITY)    EKG EKG Interpretation  Date/Time:  Sunday June 29 2019 13:43:13 EDT Ventricular Rate:  78 PR Interval:  130 QRS  Duration: 74 QT Interval:  384 QTC Calculation: 437 R Axis:   62 Text Interpretation: Normal sinus rhythm Normal ECG No significant change since  last tracing Confirmed by Richardean Canal [phone]) on 06/29/2019 4:53:32 PM   Radiology DG Chest 2 View  Result Date: 06/29/2019 CLINICAL DATA:  Right-sided chest pain. EXAM: CHEST - 2 VIEW COMPARISON:  Chest radiograph 10/26/2018 FINDINGS: Normal cardiac and mediastinal contours. No consolidative pulmonary opacities. No pleural effusion or pneumothorax. Osseous structures unremarkable. IMPRESSION: No active cardiopulmonary disease. Electronically Signed   By: Annia Belt M.D.   On: 06/29/2019 14:43    Procedures Procedures (including critical care time)  Medications Ordered in ED Medications  sodium chloride flush (NS) 0.9 % injection 3 mL (has no administration in time range)  ketorolac (TORADOL) 30 MG/ML injection 30 mg (30 mg Intramuscular Given 06/29/19 1622)    ED Course  I have reviewed the triage vital signs and the nursing notes.  Pertinent labs & imaging results that were available during my care of the patient were reviewed by me and considered in my medical decision making (see chart for details).    MDM Rules/Calculators/A&P   Patient with a past medical history of anxiety presents to the ED with complaints of chest pain on the right eye which began about 2 hours ago while pulling up her pants.  No prior history of CAD, no family history of CAD, no history of blood clots, currently not a smoker and never has.  She has taken some Tylenol without improvement in her symptoms.  Pain is exacerbated with movement of her right arm above her head, palpable pain along the right pectoral muscle.  Pain does not radiate.  No shortness of breath, palpitations.  During evaluation she is overall well-appearing, nontoxic, lungs are clear to auscultation, pain with palpation along the right pectoral muscle exacerbated with lifting of the right arm past  90 degrees.  Lungs are clear to auscultation.  Abdomen is soft, nontender to palpation.  Pulses are symmetric throughout.  No nausea or vomiting.  Does have a prior history of tachycardia, heart rate is in the 90s today but without any hypoxia.PERC negative  Interpretation of her labs by me reveal a CBC without any signs of infection, no signs of anemia.  BMP without any electrolyte derangement, creatinine level is within normal limits.  She does not have any abdominal pain.  No LFTs were added onto her panel.  Beta hCG is negative.  First troponin is negative.  I have a low suspicion for ACS as patient symptoms began while attempting to pull up her jeans, and are worsened with movement of the right arm. EKG NSR without any ST elevations or changes   Heart score 0-1    Xray of her chest showed: No consolidation, pneumothorax, pleural effusion.  Denies any fevers or coughs at home.  I discussed results of imaging along with EKG and labs with patient at length.  She was provided with Toradol to help with pain.  I do feel that her pain is mostly musculoskeletal, does not vary with ranging of the right shoulder. No injury or trauma or fevers.  5:24 PM patient reevaluated by me, reports mild improvement in symptoms after Toradol injection.  She is overall well-appearing, vitals remained stable.  Patient is stable for discharge at this time.   Portions of this note were generated with Scientist, clinical (histocompatibility and immunogenetics). Dictation errors may occur despite best attempts at proofreading.  Final Clinical Impression(s) / ED Diagnoses Final diagnoses:  Atypical chest pain    Rx / DC Orders ED Discharge Orders  Ordered    naproxen (NAPROSYN) 500 MG tablet  2 times daily     06/29/19 1655           Janeece Fitting, PA-C 06/29/19 1725    Drenda Freeze, MD 06/29/19 Pauline Aus

## 2019-06-29 NOTE — ED Notes (Signed)
Provider at bedside

## 2019-06-29 NOTE — Discharge Instructions (Addendum)
Los resultados de sus laboratorios estuvieron normales hoy. Su EKG y los rayos x de su pecho fueron normales.   Le he recetado medicina para ayudarla con Chief Technology Officer. [facility] veces al dia con comida.   Haga una cita con su doctor primario si sus simptomas no mejoran.

## 2023-02-05 ENCOUNTER — Emergency Department (HOSPITAL_BASED_OUTPATIENT_CLINIC_OR_DEPARTMENT_OTHER): Payer: Self-pay

## 2023-02-05 ENCOUNTER — Other Ambulatory Visit: Payer: Self-pay

## 2023-02-05 ENCOUNTER — Encounter (HOSPITAL_BASED_OUTPATIENT_CLINIC_OR_DEPARTMENT_OTHER): Payer: Self-pay

## 2023-02-05 ENCOUNTER — Emergency Department (HOSPITAL_BASED_OUTPATIENT_CLINIC_OR_DEPARTMENT_OTHER)
Admission: EM | Admit: 2023-02-05 | Discharge: 2023-02-05 | Disposition: A | Discharge status: Home / Self Care | Payer: Self-pay | Attending: Emergency Medicine | Admitting: Emergency Medicine

## 2023-02-05 DIAGNOSIS — Z3A12 12 weeks gestation of pregnancy: Secondary | ICD-10-CM | POA: Insufficient documentation

## 2023-02-05 DIAGNOSIS — Z3A01 Less than 8 weeks gestation of pregnancy: Secondary | ICD-10-CM

## 2023-02-05 DIAGNOSIS — O200 Threatened abortion: Secondary | ICD-10-CM | POA: Insufficient documentation

## 2023-02-05 DIAGNOSIS — O4690 Antepartum hemorrhage, unspecified, unspecified trimester: Secondary | ICD-10-CM

## 2023-02-05 LAB — COMPREHENSIVE METABOLIC PANEL
ALT: 24 U/L (ref 0–44)
AST: 25 U/L (ref 15–41)
Albumin: 3.9 g/dL (ref 3.5–5.0)
Alkaline Phosphatase: 53 U/L (ref 38–126)
Anion gap: 9 (ref 5–15)
BUN: 18 mg/dL (ref 6–20)
CO2: 23 mmol/L (ref 22–32)
Calcium: 8.8 mg/dL — ABNORMAL LOW (ref 8.9–10.3)
Chloride: 102 mmol/L (ref 98–111)
Creatinine, Ser: 0.78 mg/dL (ref 0.44–1.00)
GFR, Estimated: >60 mL/min (ref >60)
Glucose, Bld: 80 mg/dL (ref 70–99)
Potassium: 3.5 mmol/L (ref 3.5–5.1)
Sodium: 134 mmol/L — ABNORMAL LOW (ref 135–145)
Total Bilirubin: 0.5 mg/dL (ref 0.0–1.2)
Total Protein: 6.9 g/dL (ref 6.5–8.1)

## 2023-02-05 LAB — CBC WITH DIFFERENTIAL/PLATELET
Abs Immature Granulocytes: 0.05 10*3/uL (ref 0.00–0.07)
Basophils Absolute: 0 10*3/uL (ref 0.0–0.1)
Basophils Relative: 1 %
Eosinophils Absolute: 0.2 10*3/uL (ref 0.0–0.5)
Eosinophils Relative: 2 %
HCT: 40.3 % (ref 36.0–46.0)
Hemoglobin: 13.7 g/dL (ref 12.0–15.0)
Immature Granulocytes: 1 %
Lymphocytes Relative: 17 %
Lymphs Abs: 1.3 10*3/uL (ref 0.7–4.0)
MCH: 30.2 pg (ref 26.0–34.0)
MCHC: 34 g/dL (ref 30.0–36.0)
MCV: 88.8 fL (ref 80.0–100.0)
Monocytes Absolute: 0.4 10*3/uL (ref 0.1–1.0)
Monocytes Relative: 6 %
Neutro Abs: 5.7 10*3/uL (ref 1.7–7.7)
Neutrophils Relative %: 73 %
Platelets: 197 10*3/uL (ref 150–400)
RBC: 4.54 MIL/uL (ref 3.87–5.11)
RDW: 13.2 % (ref 11.5–15.5)
WBC: 7.7 10*3/uL (ref 4.0–10.5)
nRBC: 0 % (ref 0.0–0.2)

## 2023-02-05 LAB — URINALYSIS, ROUTINE W REFLEX MICROSCOPIC
Bilirubin Urine: NEGATIVE
Glucose, UA: NEGATIVE mg/dL
Ketones, ur: NEGATIVE mg/dL
Leukocytes,Ua: NEGATIVE
Nitrite: NEGATIVE
Protein, ur: NEGATIVE mg/dL
Specific Gravity, Urine: 1.02 (ref 1.005–1.030)
pH: 7.5 (ref 5.0–8.0)

## 2023-02-05 LAB — URINALYSIS, MICROSCOPIC (REFLEX): WBC, UA: NONE SEEN WBC/hpf (ref 0–5)

## 2023-02-05 LAB — HCG, QUANTITATIVE, PREGNANCY: hCG, Beta Chain, Quant, S: 42138 m[IU]/mL — ABNORMAL HIGH (ref <5)

## 2023-02-05 NOTE — Discharge Instructions (Addendum)
 It was our pleasure to provide your ER care today - we hope that you feel better.  Drink plenty of fluids/stay well hydrated.   Follow up closely with your ob/gyn doctor in the next 1-2 weeks.   No vigorous physical exercise or sexual activity until cleared to do so by your doctor.   Retun to [facility] ER (or [facility] at [facility])  if worse, increased bleeding, pelvic pain, weak/faint, fevers, or other concern.

## 2023-02-05 NOTE — ED Provider Notes (Addendum)
 [place] EMERGENCY DEPARTMENT AT [facility] Provider Note   CSN: [id number] Arrival date & time: 02/05/23  1219     History  Chief Complaint  Patient presents with   Vaginal Bleeding    Rhonda Townsend is a 43 y.o. female.  Pt c/o being ~ [redacted] weeks pregnant, and having vaginal bleeding and mild bil lower abd cramping discomfort. Indicates amount of bleeding is similar to a normal period, as is cramping. No unilateral or severe abdominal or pelvic pain. No fever or chills. No dysuria. No faintness or dizziness. G7P6. Lnmp mid October 2024, had home +preg test, no prior u/s of this pregnancy. No fever or chills. No other abnormal bruising or bleeding. Blood type O positive.   The history is provided by the patient and medical records.  Vaginal Bleeding Associated symptoms: no back pain, no dysuria, no fever and no vaginal discharge        Home Medications Prior to Admission medications   Medication Sig Start Date End Date Taking? Authorizing Provider  methocarbamol  (ROBAXIN ) 500 MG tablet Take 1 tablet (500 mg total) by mouth 2 (two) times daily. 10/27/18   Geiple, Joshua, PA-C  oxyCODONE -acetaminophen  (PERCOCET/ROXICET) 5-325 MG tablet Take 1 tablet by mouth every 6 (six) hours as needed for severe pain. 10/26/18   Desiderio Chew, PA-C      Allergies    Patient has no known allergies.    Review of Systems   Review of Systems  Constitutional:  Negative for chills and fever.  HENT:  Negative for nosebleeds.   Respiratory:  Negative for shortness of breath.   Cardiovascular:  Negative for chest pain.  Gastrointestinal:  Negative for diarrhea and vomiting.  Genitourinary:  Positive for vaginal bleeding. Negative for dysuria, flank pain and vaginal discharge.  Musculoskeletal:  Negative for back pain.  Skin:  Negative for rash.  Neurological:  Negative for light-headedness.    Physical Exam Updated Vital Signs BP 103/62   Pulse 61   Temp 98.3 F  (36.8 C)   Resp 18   Ht 1.6 m (5' 3)   Wt 63.5 kg   LMP 11/10/2022   SpO2 100%   Breastfeeding No   BMI 24.80 kg/m  Physical Exam Vitals and nursing note reviewed.  Constitutional:      Appearance: Normal appearance. She is well-developed.  HENT:     Head: Atraumatic.     Nose: Nose normal.     Mouth/Throat:     Mouth: Mucous membranes are moist.  Eyes:     General: No scleral icterus.    Conjunctiva/sclera: Conjunctivae normal.  Neck:     Trachea: No tracheal deviation.  Cardiovascular:     Rate and Rhythm: Normal rate.     Pulses: Normal pulses.  Pulmonary:     Effort: Pulmonary effort is normal. No respiratory distress.  Abdominal:     General: Bowel sounds are normal. There is no distension.     Palpations: Abdomen is soft.     Tenderness: There is no abdominal tenderness. There is no guarding.  Genitourinary:    Comments: No cva tenderness. Chaperoned pelvic exam with ED female EMT - Very scant pinkish/red blood in vagina.  No active bleeding or discharge from cervix. Cervix closed. No cmt. No focal adx masses or tenderness.  Musculoskeletal:        General: No swelling.     Cervical back: Neck supple. No muscular tenderness.  Skin:    General: Skin is warm and  dry.     Findings: No rash.  Neurological:     Mental Status: She is alert.     Comments: Alert, speech normal.   Psychiatric:        Mood and Affect: Mood normal.     ED Results / Procedures / Treatments   Labs (all labs ordered are listed, but only abnormal results are displayed) Results for orders placed or performed during the hospital encounter of 02/05/23  CBC with Differential   Collection Time: 02/05/23 12:35 PM  Result Value Ref Range   WBC 7.7 4.0 - 10.5 K/uL   RBC 4.54 3.87 - 5.11 MIL/uL   Hemoglobin 13.7 12.0 - 15.0 g/dL   HCT 59.6 63.9 - 53.9 %   MCV 88.8 80.0 - 100.0 fL   MCH 30.2 26.0 - 34.0 pg   MCHC 34.0 30.0 - 36.0 g/dL   RDW 86.7 88.4 - 84.4 %   Platelets 197 150 - 400  K/uL   nRBC 0.0 0.0 - 0.2 %   Neutrophils Relative % 73 %   Neutro Abs 5.7 1.7 - 7.7 K/uL   Lymphocytes Relative 17 %   Lymphs Abs 1.3 0.7 - 4.0 K/uL   Monocytes Relative 6 %   Monocytes Absolute 0.4 0.1 - 1.0 K/uL   Eosinophils Relative 2 %   Eosinophils Absolute 0.2 0.0 - 0.5 K/uL   Basophils Relative 1 %   Basophils Absolute 0.0 0.0 - 0.1 K/uL   Immature Granulocytes 1 %   Abs Immature Granulocytes 0.05 0.00 - 0.07 K/uL  Comprehensive metabolic panel   Collection Time: 02/05/23 12:35 PM  Result Value Ref Range   Sodium 134 (L) 135 - 145 mmol/L   Potassium 3.5 3.5 - 5.1 mmol/L   Chloride 102 98 - 111 mmol/L   CO2 23 22 - 32 mmol/L   Glucose, Bld 80 70 - 99 mg/dL   BUN 18 6 - 20 mg/dL   Creatinine, Ser 9.21 0.44 - 1.00 mg/dL   Calcium 8.8 (L) 8.9 - 10.3 mg/dL   Total Protein 6.9 6.5 - 8.1 g/dL   Albumin 3.9 3.5 - 5.0 g/dL   AST 25 15 - 41 U/L   ALT 24 0 - 44 U/L   Alkaline Phosphatase 53 38 - 126 U/L   Total Bilirubin 0.5 0.0 - 1.2 mg/dL   GFR, Estimated >39 >39 mL/min   Anion gap 9 5 - 15  hCG, quantitative, pregnancy   Collection Time: 02/05/23 12:35 PM  Result Value Ref Range   hCG, Beta Chain, Quant, S 42,138 (H) <5 mIU/mL  Urinalysis, Routine w reflex microscopic -Urine, Clean Catch   Collection Time: 02/05/23 12:35 PM  Result Value Ref Range   Color, Urine YELLOW YELLOW   APPearance CLEAR CLEAR   Specific Gravity, Urine 1.020 1.005 - 1.030   pH 7.5 5.0 - 8.0   Glucose, UA NEGATIVE NEGATIVE mg/dL   Hgb urine dipstick TRACE (A) NEGATIVE   Bilirubin Urine NEGATIVE NEGATIVE   Ketones, ur NEGATIVE NEGATIVE mg/dL   Protein, ur NEGATIVE NEGATIVE mg/dL   Nitrite NEGATIVE NEGATIVE   Leukocytes,Ua NEGATIVE NEGATIVE  Urinalysis, Microscopic (reflex)   Collection Time: 02/05/23 12:35 PM  Result Value Ref Range   RBC / HPF 0-5 0 - 5 RBC/hpf   WBC, UA NONE SEEN 0 - 5 WBC/hpf   Bacteria, UA RARE (A) NONE SEEN   Squamous Epithelial / HPF 0-5 0 - 5 /HPF     EKG None  Radiology  US  OB LESS THAN 14 WEEKS WITH OB TRANSVAGINAL Result Date: 02/05/2023 CLINICAL DATA:  Vaginal bleeding during pregnancy EXAM: OBSTETRIC <14 WK US  AND TRANSVAGINAL OB US  TECHNIQUE: Both transabdominal and transvaginal ultrasound examinations were performed for complete evaluation of the gestation as well as the maternal uterus, adnexal regions, and pelvic cul-de-sac. Transvaginal technique was performed to assess early pregnancy. COMPARISON:  None Available. FINDINGS: Intrauterine gestational sac: Single Yolk sac:  Visualized. Embryo:  Visualized. Cardiac Activity: Not Visualized. Heart Rate:   bpm MSD:   mm    w     d CRL:  3.4 mm   6 w   0 d                  US  EDC: 10/01/2023 Subchorionic hemorrhage:  None visualized. Maternal uterus/adnexae: No adnexal mass or free fluid. IMPRESSION: Six week intrauterine pregnancy. No fetal heart tones detected, likely related to early gestational age. This could be followed with repeat ultrasound in 14 days to ensure expected progression. No visible acute maternal findings. Electronically Signed   By: Franky Crease M.D.   On: 02/05/2023 17:02    Procedures Procedures    Medications Ordered in ED Medications - No data to display  ED Course/ Medical Decision Making/ A&P                                 Medical Decision Making Problems Addressed: [redacted] weeks gestation of pregnancy: acute illness or injury Threatened miscarriage: acute illness or injury with systemic symptoms that poses a threat to life or bodily functions Vaginal bleeding in pregnancy: acute illness or injury with systemic symptoms that poses a threat to life or bodily functions  Amount and/or Complexity of Data Reviewed External Data Reviewed: notes. Labs: ordered. Decision-making details documented in ED Course. Radiology: ordered and independent interpretation performed. Decision-making details documented in ED Course.  Risk Decision regarding  hospitalization.   Iv ns. Continuous pulse ox and cardiac monitoring. Labs ordered/sent. Imaging ordered.   Differential diagnosis includes ectopic pregnancy, threatened miscarriage, incomplete miscarriage, 1st trimester bleeding, etc. Dispo decision including potential need for admission considered - will get labs and imaging and reassess.   Reviewed nursing notes and prior charts for additional history. External reports reviewed.   Cardiac monitor: sinus rhythm, rate 84.  Labs reviewed/interpreted by me - wbc and hgb normal. Preg pos.   U/S reviewed/interpreted by me - 6 week pregnancy.   Recheck no abd or pelvic pain. No new or worsening bleeding.   Pt appears stable for d/c.  Pt indicates she has an ob/gyn in [place] that she will follow up with.   Rec close ob/gyn  f/u.  Return precautions provided.          Final Clinical Impression(s) / ED Diagnoses Final diagnoses:  Vaginal bleeding in pregnancy  Threatened miscarriage    Rx / DC Orders ED Discharge Orders     None           Bernard Franky, MD 02/05/23 1728

## 2023-02-05 NOTE — ED Triage Notes (Signed)
 In for eval of heavy vaginal bleeding onset yesterday. [redacted] week gestation. Denies pain. P7, G6.

## 2023-10-25 ENCOUNTER — Encounter (HOSPITAL_BASED_OUTPATIENT_CLINIC_OR_DEPARTMENT_OTHER): Payer: Self-pay

## 2023-10-25 ENCOUNTER — Emergency Department (HOSPITAL_BASED_OUTPATIENT_CLINIC_OR_DEPARTMENT_OTHER)
Admission: EM | Admit: 2023-10-25 | Discharge: 2023-10-25 | Disposition: A | Discharge status: Other Acute Inpatient Hospital | Payer: Self-pay | Attending: Emergency Medicine | Admitting: Emergency Medicine

## 2023-10-25 ENCOUNTER — Other Ambulatory Visit: Payer: Self-pay

## 2023-10-25 DIAGNOSIS — Z3A10 10 weeks gestation of pregnancy: Secondary | ICD-10-CM | POA: Insufficient documentation

## 2023-10-25 DIAGNOSIS — O209 Hemorrhage in early pregnancy, unspecified: Secondary | ICD-10-CM | POA: Insufficient documentation

## 2023-10-25 LAB — COMPREHENSIVE METABOLIC PANEL WITH GFR
ALT: 15 U/L (ref 0–44)
AST: 21 U/L (ref 15–41)
Albumin: 4.1 g/dL (ref 3.5–5.0)
Alkaline Phosphatase: 62 U/L (ref 38–126)
Anion gap: 11 (ref 5–15)
BUN: 10 mg/dL (ref 6–20)
CO2: 22 mmol/L (ref 22–32)
Calcium: 8.5 mg/dL — ABNORMAL LOW (ref 8.9–10.3)
Chloride: 104 mmol/L (ref 98–111)
Creatinine, Ser: 0.8 mg/dL (ref 0.44–1.00)
GFR, Estimated: >60 mL/min (ref >60)
Glucose, Bld: 93 mg/dL (ref 70–99)
Potassium: 4 mmol/L (ref 3.5–5.1)
Sodium: 138 mmol/L (ref 135–145)
Total Bilirubin: 0.3 mg/dL (ref 0.0–1.2)
Total Protein: 6.6 g/dL (ref 6.5–8.1)

## 2023-10-25 LAB — URINALYSIS, ROUTINE W REFLEX MICROSCOPIC
Bilirubin Urine: NEGATIVE
Glucose, UA: NEGATIVE mg/dL
Hgb urine dipstick: NEGATIVE
Ketones, ur: NEGATIVE mg/dL
Leukocytes,Ua: NEGATIVE
Nitrite: NEGATIVE
Protein, ur: NEGATIVE mg/dL
Specific Gravity, Urine: 1.02 (ref 1.005–1.030)
pH: 7 (ref 5.0–8.0)

## 2023-10-25 LAB — CBC
HCT: 37.2 % (ref 36.0–46.0)
Hemoglobin: 12.8 g/dL (ref 12.0–15.0)
MCH: 30.8 pg (ref 26.0–34.0)
MCHC: 34.4 g/dL (ref 30.0–36.0)
MCV: 89.6 fL (ref 80.0–100.0)
Platelets: 215 K/uL (ref 150–400)
RBC: 4.15 MIL/uL (ref 3.87–5.11)
RDW: 13.1 % (ref 11.5–15.5)
WBC: 7.6 K/uL (ref 4.0–10.5)
nRBC: 0 % (ref 0.0–0.2)

## 2023-10-25 LAB — LIPASE, BLOOD: Lipase: 18 U/L (ref 11–51)

## 2023-10-25 LAB — HCG, QUANTITATIVE, PREGNANCY: hCG, Beta Chain, Quant, S: 24575 m[IU]/mL — ABNORMAL HIGH (ref <5)

## 2023-10-25 NOTE — ED Provider Notes (Signed)
 [place] [facility] AT [facility] Provider Note   CSN: [id number] Arrival date & time: 10/25/23  1737     Patient presents with: Vaginal Bleeding   Rhonda Townsend is a 43 y.o. female.   43 y.o female G9P6M2 currently 10 weeks presents to the ED with a chief complaint of vaginal bleeding which began yesterday.  She reports the blood was brown, dark.  There was only bleeding whenever she was wiping.  Today she noticed the blood to be bright red, only whenever she is wiping.  She has had some nausea but she has had this throughout her entire pregnancy.  She is endorsing pain along the left side of her abdomen, intermittent.  Patient did suffer a miscarriage in the month of January.  She is currently followed at [facility].  Denies any fever, trauma, urinary symptoms.  The history is provided by the patient.  Vaginal Bleeding Associated symptoms: abdominal pain   Associated symptoms: no fever and no nausea        Prior to Admission medications   Medication Sig Start Date End Date Taking? Authorizing Provider  methocarbamol  (ROBAXIN ) 500 MG tablet Take 1 tablet (500 mg total) by mouth 2 (two) times daily. 10/27/18   Geiple, Joshua, PA-C  oxyCODONE -acetaminophen  (PERCOCET/ROXICET) 5-325 MG tablet Take 1 tablet by mouth every 6 (six) hours as needed for severe pain. 10/26/18   Geiple, Joshua, PA-C    Allergies: Patient has no known allergies.    Review of Systems  Constitutional:  Negative for chills and fever.  Respiratory:  Negative for shortness of breath.   Cardiovascular:  Negative for chest pain.  Gastrointestinal:  Positive for abdominal pain. Negative for nausea and vomiting.  Genitourinary:  Positive for vaginal bleeding.  All other systems reviewed and are negative.   Updated Vital Signs BP 111/78 (BP Location: Left Arm)   Pulse 67   Temp 98.4 F (36.9 C) (Oral)   Resp 16   Ht 5' 3 (1.6 m)   Wt 68 kg   LMP 08/18/2023 (Exact Date)    SpO2 100%   Breastfeeding Unknown   BMI 26.57 kg/m   Physical Exam Vitals and nursing note reviewed. Exam conducted with a chaperone present.  Constitutional:      Appearance: Normal appearance.  HENT:     Head: Normocephalic and atraumatic.  Eyes:     Pupils: Pupils are equal, round, and reactive to light.  Cardiovascular:     Rate and Rhythm: Normal rate.  Pulmonary:     Effort: Pulmonary effort is normal.  Abdominal:     General: Abdomen is flat.     Palpations: Abdomen is soft.     Tenderness: There is no abdominal tenderness.  Genitourinary:    Vagina: Bleeding present.     Cervix: Normal. No erythema.     Comments: Dark red blood in the vaginal vault.  Cervix does appear closed. Musculoskeletal:     Cervical back: Normal range of motion and neck supple.  Skin:    General: Skin is warm and dry.  Neurological:     Mental Status: She is alert and oriented to person, place, and time.     (all labs ordered are listed, but only abnormal results are displayed) Labs Reviewed  COMPREHENSIVE METABOLIC PANEL WITH GFR - Abnormal; Notable for the following components:      Result Value   Calcium 8.5 (*)    All other components within normal limits  HCG, QUANTITATIVE, PREGNANCY -  Abnormal; Notable for the following components:   hCG, Beta Chain, Quant, S 24,575 (*)    All other components within normal limits  LIPASE, BLOOD  CBC  URINALYSIS, ROUTINE W REFLEX MICROSCOPIC    EKG: None  Radiology: No results found.   Procedures   Medications Ordered in the ED - No data to display  Clinical Course as of 10/25/23 2114  Thu Oct 25, 2023  2004 HCG, Dorthea Denis Earleen GORMAN(!): [phone] [JS]    Clinical Course User Index [JS] Sotirios Navarro, PA-C                                 Medical Decision Making Amount and/or Complexity of Data Reviewed Labs: ordered. Decision-making details documented in ED Course.    Patient presents to the ED with vaginal bleeding, this is  her second pregnancy this year as she did suffer a miscarriage in the month of January.  She is currently followed by [facility], symptoms began yesterday with dark brown bleeding, today's bright red bleeding with left lower abdomen cramping.  Has not taken anything at home for pain.  She has used about 2 pads since 730.  Reports the bleeding is only seen whenever she wipes.  Pelvic exam performed does show the cervix closed, bleeding noted around the vaginal vault, with some dark red blood.  Her abdomen is soft.  There is pain there with palpation.  She is hemodynamically stable.  Unfortunately, we do not have ultrasound at this time in the emergency department.  Will reach out to her OB on-call at [facility] for further recommendations.  8:11 PM call placed to the [facility] line in order to have patient transfer for further care.  Bedside ultrasound performed by Dr. Lenor, unable to visualize well if IUP present. At this time patient is hemodynamically stable for transfer via POV.  Team has been notified at [facility], she will go with her husband. Accepting physician Dr. Malena.  BP 111/78 (BP Location: Left Arm)   Pulse 67   Temp 98.4 F (36.9 C) (Oral)   Resp 16   Ht 5' 3 (1.6 m)   Wt 68 kg   LMP 08/18/2023 (Exact Date)   SpO2 100%   Breastfeeding Unknown   BMI 26.57 kg/m     Portions of this note were generated with Scientist, clinical (histocompatibility and immunogenetics). Dictation errors may occur despite best attempts at proofreading.   Final diagnoses:  Vaginal bleeding in pregnancy, first trimester    ED Discharge Orders     None          Maureen Broad, PA-C 10/25/23 2114    Lenor Hollering, MD 10/25/23 2350

## 2023-10-25 NOTE — ED Triage Notes (Signed)
 Pt declined interpretor.   Husband is translating.  Pt reports vaginal bleeding x 2 days. States that blood is dark brown. Also reports cramping, and nausea.   Has only used 2 pads since 0730 am.
